# Patient Record
Sex: Female | Born: 1985
Health system: Southern US, Community
[De-identification: ages and names within clinical notes are randomized; demographics above are authoritative.]

## PROBLEM LIST (undated history)

## (undated) ENCOUNTER — Inpatient Hospital Stay (HOSPITAL_COMMUNITY): Payer: Self-pay

## (undated) DIAGNOSIS — J302 Other seasonal allergic rhinitis: Secondary | ICD-10-CM

## (undated) DIAGNOSIS — F419 Anxiety disorder, unspecified: Secondary | ICD-10-CM

## (undated) DIAGNOSIS — R42 Dizziness and giddiness: Secondary | ICD-10-CM

## (undated) DIAGNOSIS — J020 Streptococcal pharyngitis: Secondary | ICD-10-CM

## (undated) DIAGNOSIS — K589 Irritable bowel syndrome without diarrhea: Secondary | ICD-10-CM

## (undated) DIAGNOSIS — J45909 Unspecified asthma, uncomplicated: Secondary | ICD-10-CM

## (undated) DIAGNOSIS — I491 Atrial premature depolarization: Secondary | ICD-10-CM

## (undated) DIAGNOSIS — E669 Obesity, unspecified: Secondary | ICD-10-CM

## (undated) DIAGNOSIS — M26609 Unspecified temporomandibular joint disorder, unspecified side: Secondary | ICD-10-CM

## (undated) DIAGNOSIS — T7840XA Allergy, unspecified, initial encounter: Secondary | ICD-10-CM

## (undated) DIAGNOSIS — Q992 Fragile X chromosome: Secondary | ICD-10-CM

## (undated) DIAGNOSIS — Z8489 Family history of other specified conditions: Secondary | ICD-10-CM

## (undated) HISTORY — PX: SPINE SURGERY: SHX786

## (undated) HISTORY — DX: Atrial premature depolarization: I49.1

## (undated) HISTORY — DX: Irritable bowel syndrome, unspecified: K58.9

## (undated) HISTORY — DX: Family history of other specified conditions: Z84.89

## (undated) HISTORY — DX: Unspecified asthma, uncomplicated: J45.909

## (undated) HISTORY — DX: Unspecified temporomandibular joint disorder, unspecified side: M26.609

## (undated) HISTORY — DX: Obesity, unspecified: E66.9

## (undated) HISTORY — DX: Allergy, unspecified, initial encounter: T78.40XA

## (undated) HISTORY — PX: BRAIN SURGERY: SHX531

## (undated) HISTORY — DX: Dizziness and giddiness: R42

## (undated) HISTORY — DX: Anxiety disorder, unspecified: F41.9

## (undated) HISTORY — DX: Fragile x chromosome: Q99.2

---

## 2011-10-28 ENCOUNTER — Emergency Department (HOSPITAL_COMMUNITY)
Admission: EM | Admit: 2011-10-28 | Discharge: 2011-10-28 | Disposition: A | Discharge status: Home Health | Payer: Self-pay | Source: Home / Self Care | Attending: Emergency Medicine | Admitting: Emergency Medicine

## 2011-10-28 ENCOUNTER — Encounter (HOSPITAL_COMMUNITY): Payer: Self-pay

## 2011-10-28 DIAGNOSIS — J029 Acute pharyngitis, unspecified: Secondary | ICD-10-CM

## 2011-10-28 HISTORY — DX: Streptococcal pharyngitis: J02.0

## 2011-10-28 HISTORY — DX: Other seasonal allergic rhinitis: J30.2

## 2011-10-28 LAB — POCT RAPID STREP A: Streptococcus, Group A Screen (Direct): NEGATIVE

## 2011-10-28 MED ORDER — LIDOCAINE VISCOUS 2 % MT SOLN: 10.0000 mL | OROMUCOSAL | Status: AC | PRN

## 2011-10-28 MED ORDER — FLUTICASONE PROPIONATE 50 MCG/ACT NA SUSP: 2.0000 | Freq: Every day | NASAL | Status: DC

## 2011-10-28 NOTE — ED Provider Notes (Signed)
History     CSN: [id number]  Arrival date & time 10/28/11  1751   First MD Initiated Contact with Patient 10/28/11 1816      Chief Complaint  Patient presents with  . Sore Throat    (Consider location/radiation/quality/duration/timing/severity/associated sxs/prior treatment) HPI Comments: Patient reports sore, irritated throat starting 3 days ago. Patient initially thought that this is due to allergies, as she also has some rhinorrhea and sneezing, postnasal drip, so she started taking Allegra-D without improvement. Also states her gums are starting to swell, which is typical of previous strep infections. No nausea, vomiting, fevers, drooling, trismus, ear pain, sinus pain/pressure, abdominal pain, rash, reflux symptoms. Patient's significant other was just diagnosed with strep throat.  ROS as noted in HPI. All other ROS negative.   Patient is a 26 y.o. female presenting with pharyngitis. The history is provided by the patient. No language interpreter was used.  Sore Throat This is a new problem. The current episode started more than 2 days ago. The problem occurs constantly. The problem has been gradually worsening. Pertinent negatives include no chest pain, no abdominal pain, no headaches and no shortness of breath. The symptoms are aggravated by nothing. The symptoms are relieved by nothing. Treatments tried: allergy medications. The treatment provided mild relief.    Past Medical History  Diagnosis Date  . Seasonal allergic rhinitis   . Strep pharyngitis     Past Surgical History  Procedure Date  . Brain surgery     arnold-chiari malformation    History reviewed. No pertinent family history.  History  Substance Use Topics  . Smoking status: Never Smoker   . Smokeless tobacco: Not on file  . Alcohol Use: Yes    OB History    Grav Para Term Preterm Abortions TAB SAB Ect Mult Living                  Review of Systems  Respiratory: Negative for shortness of breath.    Cardiovascular: Negative for chest pain.  Gastrointestinal: Negative for abdominal pain.  Neurological: Negative for headaches.    Allergies  Review of patient's allergies indicates no known allergies.  Home Medications   Current Outpatient Rx  Name Route Sig Dispense Refill  . ASPIRIN 81 MG PO TABS Oral Take 81 mg by mouth daily.    Marland Kitchen FEXOFENADINE-PSEUDOEPHED ER 180-240 MG PO TB24 Oral Take 1 tablet by mouth daily.    Marland Kitchen FLUTICASONE PROPIONATE 50 MCG/ACT NA SUSP Nasal Place 2 sprays into the nose daily. 16 g 0  . LIDOCAINE VISCOUS 2 % MT SOLN Oral Take 10 mLs by mouth as needed for pain. Swish and spit. Do not swallow. 100 mL 0    BP 134/80  Pulse 84  Temp(Src) 98.5 F (36.9 C) (Oral)  Resp 19  SpO2 100%  LMP 10/16/2011  Physical Exam  Nursing note and vitals reviewed. Constitutional: She is oriented to person, place, and time. She appears well-developed and well-nourished. No distress.  HENT:  Head: Normocephalic and atraumatic. No trismus in the jaw.  Right Ear: Tympanic membrane normal.  Left Ear: Tympanic membrane normal.  Nose: Mucosal edema and rhinorrhea present. Right sinus exhibits no maxillary sinus tenderness and no frontal sinus tenderness. Left sinus exhibits no maxillary sinus tenderness and no frontal sinus tenderness.  Mouth/Throat: Uvula is midline, oropharynx is clear and moist and mucous membranes are normal.       Erythematous oropharynx, tonsils. No exudates.  Eyes: Conjunctivae and EOM are normal.  Neck: Normal range of motion.  Cardiovascular: Normal rate, regular rhythm and normal heart sounds.   Pulmonary/Chest: Effort normal and breath sounds normal.  Abdominal: She exhibits no distension.  Musculoskeletal: Normal range of motion.  Lymphadenopathy:    She has no cervical adenopathy.  Neurological: She is alert and oriented to person, place, and time.  Skin: Skin is warm and dry.  Psychiatric: She has a normal mood and affect. Her behavior is  normal. Judgment and thought content normal.    ED Course  Procedures (including critical care time)   Labs Reviewed  POCT RAPID STREP A ([facility] ONLY)  STREP A DNA PROBE   No results found.   1. Pharyngitis     Results for orders placed during the hospital encounter of 10/28/11  POCT RAPID STREP A ([facility] ONLY)      Component Value Range   Streptococcus, Group A Screen (Direct) NEGATIVE  NEGATIVE      MDM   Rapid strep negative. Obtaining throat culture to guide antibiotic results. In the meantime, treating as if this is pharyngitis from her allergies. Sending home with Flonase, saline nasal irrigation. Discussed this with patientt. We'll contact them if culture is positive, and will call in pcn 500 mg tid x 10 days. Patient home with ibuprofen, Tylenol. Patient to followup with PMD when necessary, will refer to local primary care resources.   Luiz Blare, MD 10/28/11 2242

## 2011-10-28 NOTE — ED Notes (Signed)
Pt c/o sore throat onset Sunday.  Pt states fiancee recently DX with Strep last night.

## 2011-10-28 NOTE — Discharge Instructions (Signed)

## 2011-10-29 LAB — STREP A DNA PROBE: Group A Strep Probe: NEGATIVE

## 2011-11-02 ENCOUNTER — Emergency Department (INDEPENDENT_AMBULATORY_CARE_PROVIDER_SITE_OTHER)
Admission: EM | Admit: 2011-11-02 | Discharge: 2011-11-02 | Disposition: A | Discharge status: Home / Self Care | Payer: Self-pay | Source: Home / Self Care | Attending: Emergency Medicine | Admitting: Emergency Medicine

## 2011-11-02 ENCOUNTER — Encounter (HOSPITAL_COMMUNITY): Payer: Self-pay

## 2011-11-02 DIAGNOSIS — H8110 Benign paroxysmal vertigo, unspecified ear: Secondary | ICD-10-CM

## 2011-11-02 DIAGNOSIS — M2669 Other specified disorders of temporomandibular joint: Secondary | ICD-10-CM

## 2011-11-02 MED ORDER — MECLIZINE HCL 25 MG PO TABS: 25.0000 mg | ORAL_TABLET | Freq: Four times a day (QID) | ORAL | Status: AC

## 2011-11-02 MED ORDER — DICLOFENAC SODIUM 75 MG PO TBEC: 75.0000 mg | DELAYED_RELEASE_TABLET | Freq: Two times a day (BID) | ORAL | Status: DC

## 2011-11-02 NOTE — ED Provider Notes (Signed)
Chief Complaint  Patient presents with  . Jaw Pain    History of Present Illness:  Kristina Stewart is a 26 year old female who presents today with vertigo and right ear pain.  The vertigo began yesterday when she woke up in the morning. She describes spinning of her surroundings which is worse if she lies flat, closes her eyes, leans forward, or rolls over. She had this before a few months ago and was told it was due to an inner ear problem. She was given Antivert for this. It cleared up by itself. She denies any difficulty hearing or ringing in her ears. She has had no headache, diplopia, blurry vision, numbness, tingling, weakness, or difficulty with speech or ambulation.  Her second complaint has been pain in her right ear. This has been going on for 2 days as well. The ear feels congested. She denies any drainage, nasal congestion, rhinorrhea, sore throat, or fever. She has braces. She's wearing a retainer right now. She does not have a dentist here in [place].  Review of Systems:  Other than noted above, the patient denies any of the following symptoms: Systemic:  No fever, chills, fatigue, or weight loss. Eye:  No blurred vision, visual change or diplopia. ENT:  No ear pain, tinnitus, hearing loss, nasal congestion, or rhinorrhea. Cardiac:  No chest pain, dyspnea, palpitations or syncope. Neuro:  No headache, paresthesias, weakness, trouble with speech, coordination or ambulation.  PMFSH:  Past medical history, family history, social history, meds, and allergies were reviewed.  Physical Exam:   Vital signs:  BP 114/75  Pulse 66  Temp(Src) 98.2 F (36.8 C) (Oral)  Resp 16  SpO2 100%  LMP 10/16/2011 General:  Alert, oriented times 3, in no distress. Eye:  PERRL, full EOM, no nystagmus. ENT:  TMs and canals normal.  Nasal mucosa normal.  Pharynx clear. There was tenderness to palpation over the right TMJ. She had no trismus but to have pain on opening and closing of her mouth. Range of  motion of the TMJ was normal. Neck:  No adenopathy, tenderness, or mass.  Thyroid normal.  No carotid bruit. Lungs:  Breath sounds clear and equal bilaterally.  No wheezes, rales or rhonchi. Heart:  Regular rhythm.  No gallops, murmers, or rubs. Neuro:  Alert and oriented times 3.  Cranial nerves intact.  No pronator drift.  Finger to nose normal.  No focal weakness.  Sensation intact to light touch.  Romberg's sign negative, gait normal.  Able to do tandem gait well.  The Dix-Hallpike maneuver was positive with the right ear down.  Assessment:  The primary encounter diagnosis was Benign positional vertigo. A diagnosis of TMJ arthritis was also pertinent to this visit.  Plan:   1.  The following meds were prescribed:   New Prescriptions   DICLOFENAC (VOLTAREN) 75 MG EC TABLET    Take 1 tablet (75 mg total) by mouth 2 (two) times daily.   MECLIZINE (ANTIVERT) 25 MG TABLET    Take 1 tablet (25 mg total) by mouth 4 (four) times daily.   2.  The patient was instructed in symptomatic care and handouts were given. 3.  The patient was told to return if becoming worse in any way, if no better in 3 or 4 days, and given some red flag symptoms that would indicate earlier return. She was told to followup with her dentist with regard to the TMJ syndrome, follow soft diet, and apply heat or ice. She was given Epley exercises for  the vertigo and is to come back in a week if no improvement.    Reuben Likes, MD 11/02/11 [phone]

## 2011-11-02 NOTE — Discharge Instructions (Signed)
Benign Positional Vertigo Vertigo means you feel like you or your surroundings are moving when they are not. Benign positional vertigo is the most common form of vertigo. Benign means that the cause of your condition is not serious. Benign positional vertigo is more common in older adults. CAUSES  Benign positional vertigo is the result of an upset in the labyrinth system. This is an area in the middle ear that helps control your balance. This may be caused by a viral infection, head injury, or repetitive motion. However, often no specific cause is found. SYMPTOMS  Symptoms of benign positional vertigo occur when you move your head or eyes in different directions. Some of the symptoms may include:  Loss of balance and falls.   Vomiting.   Blurred vision.   Dizziness.   Nausea.   Involuntary eye movements (nystagmus).  DIAGNOSIS  Benign positional vertigo is usually diagnosed by physical exam. If the specific cause of your benign positional vertigo is unknown, your caregiver may perform imaging tests, such as magnetic resonance imaging (MRI) or computed tomography (CT). TREATMENT  Your caregiver may recommend movements or procedures to correct the benign positional vertigo. Medicines such as meclizine, benzodiazepines, and medicines for nausea may be used to treat your symptoms. In rare cases, if your symptoms are caused by certain conditions that affect the inner ear, you may need surgery. HOME CARE INSTRUCTIONS   Follow your caregiver's instructions.   Move slowly. Do not make sudden body or head movements.   Avoid driving.   Avoid operating heavy machinery.   Avoid performing any tasks that would be dangerous to you or others during a vertigo episode.   Drink enough fluids to keep your urine clear or pale yellow.  SEEK IMMEDIATE MEDICAL CARE IF:   You develop problems with walking, weakness, numbness, or using your arms, hands, or legs.   You have difficulty speaking.   You  develop severe headaches.   Your nausea or vomiting continues or gets worse.   You develop visual changes.   Your family or friends notice any behavioral changes.   Your condition gets worse.   You have a fever.   You develop a stiff neck or sensitivity to light.  MAKE SURE YOU:   Understand these instructions.   Will watch your condition.   Will get help right away if you are not doing well or get worse.  Document Released: 03/23/2006 Document Revised: 06/04/2011 Document Reviewed: 03/05/2011 [facility] Patient Information 2012 [place], [place].Jaw Range of Motion Exercises Do the exercises below to prevent problems opening your mouth after a jaw fracture, TMJ, or surgery. HOME CARE Warm up  Open your mouth as wide as possible for 15 seconds.   Then, close your mouth and rest. Repeat this 8 times.  Exercises  Stick your jaw forward for 15 seconds. Rest your jaw. Repeat this 8 times.   Move your jaw right for 15 seconds. Rest your jaw and repeat 8 times.   Move your jaw left for 15 seconds. Rest your jaw and repeat 8 times.   Put your middle finger and thumb in your mouth. Push with your thumb on your top teeth and your middle finger on your bottom teeth opening your mouth.   Stretch your mouth open as far as possible. Repeat 8 times.   Chew gum when you are not doing exercises.   Use moist heat packs 3 to 4 times a day on your jaw area.  Document Released: 05/28/2008 Document Revised:  06/04/2011 Document Reviewed: 05/28/2008 [employer] Patient Information 2012 [place], [place].Vertigo Vertigo means you feel like you or your surroundings are moving when they are not. Vertigo can be dangerous if it occurs when you are at work, driving, or performing difficult activities.  CAUSES  Vertigo occurs when there is a conflict of signals sent to your brain from the visual and sensory systems in your body. There are many different causes of vertigo, including:  Infections,  especially in the inner ear.   A bad reaction to a drug or misuse of alcohol and medicines.   Withdrawal from drugs or alcohol.   Rapidly changing positions, such as lying down or rolling over in bed.   A migraine headache.   Decreased blood flow to the brain.   Increased pressure in the brain from a head injury, infection, tumor, or bleeding.  SYMPTOMS  You may feel as though the world is spinning around or you are falling to the ground. Because your balance is upset, vertigo can cause nausea and vomiting. You may have involuntary eye movements (nystagmus). DIAGNOSIS  Vertigo is usually diagnosed by physical exam. If the cause of your vertigo is unknown, your caregiver may perform imaging tests, such as an MRI scan (magnetic resonance imaging). TREATMENT  Most cases of vertigo resolve on their own, without treatment. Depending on the cause, your caregiver may prescribe certain medicines. If your vertigo is related to body position issues, your caregiver may recommend movements or procedures to correct the problem. In rare cases, if your vertigo is caused by certain inner ear problems, you may need surgery. HOME CARE INSTRUCTIONS   Follow your caregiver's instructions.   Avoid driving.   Avoid operating heavy machinery.   Avoid performing any tasks that would be dangerous to you or others during a vertigo episode.   Tell your caregiver if you notice that certain medicines seem to be causing your vertigo. Some of the medicines used to treat vertigo episodes can actually make them worse in some people.  SEEK IMMEDIATE MEDICAL CARE IF:   Your medicines do not relieve your vertigo or are making it worse.   You develop problems with talking, walking, weakness, or using your arms, hands, or legs.   You develop severe headaches.   Your nausea or vomiting continues or gets worse.   You develop visual changes.   A family member notices behavioral changes.   Your condition gets  worse.  MAKE SURE YOU:  Understand these instructions.   Will watch your condition.   Will get help right away if you are not doing well or get worse.  Document Released: 03/25/2005 Document Revised: 06/04/2011 Document Reviewed: 01/01/2011 [facility] Patient Information 2012 [place], [place].

## 2011-11-02 NOTE — ED Notes (Addendum)
C/o she woke w  right  jaw pain , dizziness and  right ear pain yesterday ; has invisalign dental adjustment appliances  , and initially thought it was her teeth shifting; NAD; states she has not taken any medication for this issue , as se does not like to take medication

## 2012-01-12 ENCOUNTER — Encounter: Payer: Self-pay | Admitting: Internal Medicine

## 2012-01-12 ENCOUNTER — Ambulatory Visit (INDEPENDENT_AMBULATORY_CARE_PROVIDER_SITE_OTHER): Payer: 59 | Admitting: Internal Medicine

## 2012-01-12 VITALS — BP 118/76 | HR 76 | Temp 98.9°F | Ht 65.0 in | Wt 214.0 lb

## 2012-01-12 DIAGNOSIS — E7210 Disorders of sulfur-bearing amino-acid metabolism, unspecified: Secondary | ICD-10-CM

## 2012-01-12 DIAGNOSIS — Z0000 Encounter for general adult medical examination without abnormal findings: Secondary | ICD-10-CM

## 2012-01-12 DIAGNOSIS — M79609 Pain in unspecified limb: Secondary | ICD-10-CM

## 2012-01-12 DIAGNOSIS — R609 Edema, unspecified: Secondary | ICD-10-CM

## 2012-01-12 DIAGNOSIS — E7212 Methylenetetrahydrofolate reductase deficiency: Secondary | ICD-10-CM

## 2012-01-12 DIAGNOSIS — M79604 Pain in right leg: Secondary | ICD-10-CM

## 2012-01-12 LAB — POCT URINALYSIS DIPSTICK
Blood, UA: NEGATIVE
Ketones, UA: NEGATIVE
Leukocytes, UA: NEGATIVE
Protein, UA: NEGATIVE
pH, UA: 7

## 2012-01-12 LAB — CBC WITH DIFFERENTIAL/PLATELET
Basophils Absolute: 0 10*3/uL (ref 0.0–0.1)
Basophils Relative: 0 % (ref 0–1)
Eosinophils Absolute: 0.2 10*3/uL (ref 0.0–0.7)
Eosinophils Relative: 2 % (ref 0–5)
HCT: 40.9 % (ref 36.0–46.0)
Hemoglobin: 14 g/dL (ref 12.0–15.0)
MCH: 28.4 pg (ref 26.0–34.0)
MCHC: 34.2 g/dL (ref 30.0–36.0)
Monocytes Absolute: 0.6 10*3/uL (ref 0.1–1.0)
Monocytes Relative: 5 % (ref 3–12)
Neutro Abs: 7.5 10*3/uL (ref 1.7–7.7)
RDW: 13.5 % (ref 11.5–15.5)

## 2012-01-12 LAB — LIPID PANEL
HDL: 55 mg/dL (ref >39)
LDL Cholesterol: 115 mg/dL — ABNORMAL HIGH (ref 0–99)
Total CHOL/HDL Ratio: 3.6 Ratio
Triglycerides: 138 mg/dL (ref <150)

## 2012-01-12 LAB — COMPREHENSIVE METABOLIC PANEL
AST: 17 U/L (ref 0–37)
Albumin: 4.4 g/dL (ref 3.5–5.2)
Alkaline Phosphatase: 70 U/L (ref 39–117)
BUN: 13 mg/dL (ref 6–23)
Creat: 0.73 mg/dL (ref 0.50–1.10)
Glucose, Bld: 78 mg/dL (ref 70–99)
Total Bilirubin: 0.4 mg/dL (ref 0.3–1.2)

## 2012-01-12 LAB — TSH: TSH: 1.603 u[IU]/mL (ref 0.350–4.500)

## 2012-01-13 ENCOUNTER — Encounter (HOSPITAL_COMMUNITY): Payer: Self-pay

## 2012-01-18 ENCOUNTER — Encounter: Payer: Self-pay | Admitting: Internal Medicine

## 2012-01-18 DIAGNOSIS — E7212 Methylenetetrahydrofolate reductase deficiency: Secondary | ICD-10-CM | POA: Insufficient documentation

## 2012-01-18 DIAGNOSIS — M79604 Pain in right leg: Secondary | ICD-10-CM | POA: Insufficient documentation

## 2012-01-18 DIAGNOSIS — R609 Edema, unspecified: Secondary | ICD-10-CM | POA: Insufficient documentation

## 2012-01-18 NOTE — Progress Notes (Signed)
Subjective:    Patient ID: Kristina Stewart, female    DOB: [birth year], 26 y.o.   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 She husband are planning honeymoon in the next couple of weeks. They'll be doing a lot of traveling to [place] and then by plane to the [place]. Patient tells me that she had lab tests done in [place] indicating that she had MTHFR gene mutation. Unfortunately at the time of her initial visit she did not have those records with her. She delivered the records couple of days after her initial visit. Patient wants to see a hematologist regarding this condition. Patient has been on her feet a lot. Has been wearing some tight compression stockings to work. Her legs hurt after a long day at work.  She seems anxious about her health. She says that doctors do not listen to her when she was younger. She says she had a craniotomy in may 2006 for an Arnold-Chiari malformation. She is allergic to latex. Says she has a number of food allergies including almonds, cherries, apples and plum. Gets itchy throat when she eats these foods. She been taking aspirin 81 mg daily on advice from physician in [place] until she could get established here. Takes a multivitamin daily, fish oil 3000 mg daily and Allegra.  She does not smoke. Rare alcohol consumption consisting of maybe 4 ounces a month. No children. Husband will be teaching advanced mathematics at [employer].. Family history her age 4 with history of hypertension. Mother age 63 with history of hypertension. One brother in good health. One sister with history of fragile X. syndrome and ADHD age 65.  Patient later brought in records from [place] showing protein S of 99, protein C of 120; factor V Leiden mutation negative. However records indicate that she is homozygous   positive    For MTHFR  C 677T  [place]. Patient is worried about this diagnosis.  She has a history of allergic rhinitis. Has had benign positional vertigo diagnosed at [facility] urgent care 11/02/2011. History of TMJ syndrome.     Review of Systems  Constitutional: Negative.   HENT:       History of allergic rhinitis  Eyes: Negative.   Respiratory: Negative.   Cardiovascular: Negative.   Gastrointestinal: Negative.   Genitourinary:       Menarche at age 32. Menstrual period last 6 days every 27 days. Last menstrual period 01/07/2012.  Musculoskeletal:       Leg pain  Neurological:       History of surgery for Arnold-Chiari malformation  Hematological: Negative.   Psychiatric/Behavioral:       Anxious       Objective:   Physical Exam  Constitutional: She appears well-developed and well-nourished. No distress.  HENT:  Head: Normocephalic and atraumatic.  Right Ear: External ear normal.  Left Ear: External ear normal.  Mouth/Throat: Oropharynx is clear and moist.  Eyes: Conjunctivae and EOM are normal. Pupils are equal, round, and reactive to light. Right eye exhibits no discharge. Left eye exhibits no discharge. No scleral icterus.  Neck: Neck supple. Thyromegaly present.  Cardiovascular: Normal rate, regular rhythm, normal heart sounds and intact distal pulses.   No murmur heard. Pulmonary/Chest: Effort normal and breath sounds normal. She has no wheezes. She has no rales.  Abdominal: Soft. Bowel sounds are normal. She  exhibits no distension and no mass. There is no tenderness. There is no rebound and no guarding.  Musculoskeletal: She exhibits no edema.  Lymphadenopathy:    She has no cervical adenopathy.  Neurological: She is alert. She has normal reflexes. She displays normal reflexes. No cranial nerve deficit.  Skin: Skin is warm and dry. No rash noted. She is not diaphoretic.  Psychiatric: She has a normal mood and affect. Her behavior is normal. Judgment and thought  content normal.          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 She currently is on 81 mg of aspirin daily. May consider just wearing some good support pantyhose instead of tight compression stockings. Arrange for evaluation with hematologist. Leg pain may improve with change in support hosiery. Patient may not be at high risk for clotting disorder with this allele. She was sent information on this condition.  Plan:

## 2012-01-18 NOTE — Patient Instructions (Addendum)
Take aspirin 325 mg daily while on honeymoon been decreased to 81 mg of aspirin daily when he returned. We will get appointment for you to see hematologist regarding your results from lab work done in [place] January 2013

## 2012-01-29 ENCOUNTER — Other Ambulatory Visit: Payer: Self-pay | Admitting: Oncology

## 2012-01-29 DIAGNOSIS — E7212 Methylenetetrahydrofolate reductase deficiency: Secondary | ICD-10-CM

## 2012-02-03 ENCOUNTER — Telehealth: Payer: Self-pay | Admitting: Oncology

## 2012-02-03 NOTE — Telephone Encounter (Signed)
Referred by Dr. Sharlet Salina Dx- MTHFR [id number]. NP packet mailed out.

## 2012-02-03 NOTE — Telephone Encounter (Signed)
S/w pt today re appt for 9/4 lb and 9/11 w/JG.

## 2012-02-05 ENCOUNTER — Other Ambulatory Visit: Payer: Self-pay | Admitting: Oncology

## 2012-02-05 DIAGNOSIS — E7212 Methylenetetrahydrofolate reductase deficiency: Secondary | ICD-10-CM

## 2012-03-02 ENCOUNTER — Other Ambulatory Visit (HOSPITAL_BASED_OUTPATIENT_CLINIC_OR_DEPARTMENT_OTHER): Payer: 59 | Admitting: Lab

## 2012-03-02 ENCOUNTER — Ambulatory Visit: Payer: 59

## 2012-03-02 ENCOUNTER — Encounter: Payer: Self-pay | Admitting: Oncology

## 2012-03-02 DIAGNOSIS — E7210 Disorders of sulfur-bearing amino-acid metabolism, unspecified: Secondary | ICD-10-CM

## 2012-03-02 DIAGNOSIS — E7212 Methylenetetrahydrofolate reductase deficiency: Secondary | ICD-10-CM

## 2012-03-02 LAB — CBC & DIFF AND RETIC
Basophils Absolute: 0 10*3/uL (ref 0.0–0.1)
EOS%: 1.8 % (ref 0.0–7.0)
Eosinophils Absolute: 0.2 10*3/uL (ref 0.0–0.5)
HGB: 13 g/dL (ref 11.6–15.9)
Immature Retic Fract: 4.8 % (ref 1.60–10.00)
NEUT#: 7.9 10*3/uL — ABNORMAL HIGH (ref 1.5–6.5)
RDW: 13.3 % (ref 11.2–14.5)
Retic Ct Abs: 61.25 10*3/uL (ref 33.70–90.70)
lymph#: 2.1 10*3/uL (ref 0.9–3.3)

## 2012-03-02 LAB — MORPHOLOGY: PLT EST: ADEQUATE

## 2012-03-02 LAB — FOLATE: Folate: >20 ng/mL

## 2012-03-02 NOTE — Progress Notes (Signed)
Checked in new pt.  No financial concerns at this time. °

## 2012-03-09 ENCOUNTER — Ambulatory Visit (HOSPITAL_BASED_OUTPATIENT_CLINIC_OR_DEPARTMENT_OTHER): Payer: 59 | Admitting: Oncology

## 2012-03-09 ENCOUNTER — Telehealth: Payer: Self-pay | Admitting: Oncology

## 2012-03-09 VITALS — BP 119/78 | HR 66 | Temp 98.9°F | Resp 20 | Ht 65.0 in | Wt 220.2 lb

## 2012-03-09 DIAGNOSIS — E7211 Homocystinuria: Secondary | ICD-10-CM

## 2012-03-09 DIAGNOSIS — E7210 Disorders of sulfur-bearing amino-acid metabolism, unspecified: Secondary | ICD-10-CM

## 2012-03-09 NOTE — Progress Notes (Signed)
New Patient Hematology-Oncology Evaluation   Kristina Stewart [id number] 1985/12/16 26 y.o. 03/09/2012   CC:    Reason for referral: Counseling with respect of the finding of homozygous MTFHR gene mutation  Pleasant 26 year old woman just moved here from [place] in January of this year. Although she has no personal or family history of blood clots, a physician in [place] ordered a partial hypercoagulation profile apparently because she told him that she planned to do some traveling. She was found to be a homozygote for the MT HFR gene. She tested negative for the factor V Leiden gene mutation. She had a normal functional protein as 99% of control and functional protein C 129% of control. Anticardiolipin antibodies were not detected. She had a normal complete blood count with a hemoglobin of 14 and a platelet count of 354,000.  Her medical history is most remarkable for the fact that she was found to have a Chiari malformation at age 72 and had a neurosurgical procedure to decompress her skull with resection of cervical vertebrae. No perioperative complications and a good clinical result with resolution of severe headaches. She has had no other major surgical procedures.  She has never been pregnant. She is recently married and is considering having a family but not right away.  She has a sister who has the fragile X. Syndrome. She is 24 years old. She is a healthy 28 year old brother. Parents both have hypertension mother also has hypothyroidism no one in the family has had problem with blood clots. A maternal aunt who is a heavy smoker and hypertensive has had DVTs and has a caval filter.      PMH: Past Medical History  Diagnosis Date  . Seasonal allergic rhinitis   . Strep pharyngitis   . Blood dyscrasia   . Allergy   . TMJ (temporomandibular joint syndrome)    childhood asthma and she does start wheezing and she exercises strenuously. Not currently taking medication for  this. No history of thyroid disease but many women in her family have thyroid problems. No hypertension, MI, ulcers, hepatitis, yellow jaundice, seizures.  Past Surgical History  Procedure Date  . Brain surgery     arnold-chiari malformation  . Brain surgery     craniotomy/arnold chiari malformation     Allergies: Allergies  Allergen Reactions  . Latex Rash  . Latex     Medications: She was put on aspirin 81 mg daily pending visit today. Allegra one daily. Omega-3 show capsules 1000 mg 3 capsules daily, lysine 1000 mg one daily, multivitamins with minerals one daily.   Social History:   reports that she has never smoked. She has never used smokeless tobacco. She reports that she drinks about 2.4 ounces of alcohol per week. She reports that she does not use illicit drugs.  Family History: Family History  Problem Relation Age of Onset  . Hypertension Mother   . Hypertension Father     Review of Systems: Constitutional symptoms: No constitutional symptoms HEENT: No sore throat Respiratory: No cough or dyspnea. Cardiovascular:  No chest pain or palpitations Gastrointestinal ROS: No change in bowel habit Genito-Urinary ROS: Regular menstrual cycles  Hematological and Lymphatic: Musculoskeletal: No muscle or bone pain. Legs feel heavy at times. Neurologic: No headache or change in vision Dermatologic: No rash or ecchymosis Remaining ROS negative. She eats a healthy diet.  Physical Exam: Blood pressure 119/78, pulse 66, temperature 98.9 F (37.2 C), temperature source Oral, resp. rate 20, height 5\' 5"  (1.651 m),  weight 220 lb 3.2 oz (99.882 kg). Wt Readings from Last 3 Encounters:  03/09/12 220 lb 3.2 oz (99.882 kg)  01/12/12 214 lb (97.07 kg)    General appearance: Well-nourished Caucasian woman Head: Normal Neck: Scar posterior neck from a previous surgery for Chiari malformation Lymph nodes: No adenopathy Breasts: Not examined Lungs: Clear to auscultation resonant  to percussion Heart: Regular rhythm no murmur gallop or click Abdominal: Soft nontender no mass no organomegaly GU: Not examined Extremities: No edema no calf tenderness Neurologic: Mental status intact, PERRLA, optic disc sharp and vessels normal, motor strength 5 over 5, reflexes 1+ symmetric. Skin: No rash or ecchymosis    Lab Results: Lab Results  Component Value Date   WBC 10.9* 03/02/2012   HGB 13.0 03/02/2012   HCT 38.6 03/02/2012   MCV 83.2 03/02/2012   PLT 308 03/02/2012     Chemistry      Component Value Date/Time   NA 139 01/12/2012 1013   K 4.3 01/12/2012 1013   CL 103 01/12/2012 1013   CO2 28 01/12/2012 1013   BUN 13 01/12/2012 1013   CREATININE 0.73 01/12/2012 1013      Component Value Date/Time   CALCIUM 10.3 01/12/2012 1013   ALKPHOS 70 01/12/2012 1013   AST 17 01/12/2012 1013   ALT 16 01/12/2012 1013   BILITOT 0.4 01/12/2012 1013        Impression and Plan: Incidentally found on the side this status for the MTHFR gene polymorphism  We have learned a lot about this gene polymorphism over the last 10 years. It appears to be a surrogate marker for a slight increase in the risk of arterial and venous thrombosis but this risk appears not to be even as high as the clotting risk associated with hypertension, diabetes, or hyperlipidemia.  In patients who have had blood clots that had been felt to be  associated with elevated plasma homocystine, even if homocystine is lowered by using B complex vitamin therapy, risk for subsequent clotting events is not lowered   It is no longer felt that this gene should be tested on a routine clotting profile in patients who present with otherwise idiopathic thrombosis. It is no longer felt that B vitamin supplementation should be given even if the homocystine levels are elevated. It is no longer believed that there is any association between mutations in this gene and pregnancy complications, including recurrent miscarriage or fetal growth  retardation. There is no data that there is any contraindication to using oral contraceptives in patients with this gene polymorphism.  Patient is reassured that this is essentially a variation of normal and should be of little clinical consequence. She does not need to take aspirin or the vitamin supplementation unless she otherwise wants to do this.      Levert Feinstein, MD 03/09/2012, 1:07 PM

## 2012-03-09 NOTE — Telephone Encounter (Signed)
Per 9/11 pof return PRN.

## 2012-04-01 ENCOUNTER — Encounter: Payer: Self-pay | Admitting: Obstetrics and Gynecology

## 2012-04-01 ENCOUNTER — Ambulatory Visit (INDEPENDENT_AMBULATORY_CARE_PROVIDER_SITE_OTHER): Payer: 59 | Admitting: Obstetrics and Gynecology

## 2012-04-01 VITALS — BP 110/72 | HR 76 | Ht 64.5 in | Wt 220.0 lb

## 2012-04-01 DIAGNOSIS — Z01419 Encounter for gynecological examination (general) (routine) without abnormal findings: Secondary | ICD-10-CM

## 2012-04-01 DIAGNOSIS — E7210 Disorders of sulfur-bearing amino-acid metabolism, unspecified: Secondary | ICD-10-CM

## 2012-04-01 DIAGNOSIS — E7212 Methylenetetrahydrofolate reductase deficiency: Secondary | ICD-10-CM

## 2012-04-01 DIAGNOSIS — Z124 Encounter for screening for malignant neoplasm of cervix: Secondary | ICD-10-CM

## 2012-04-01 MED ORDER — ETONOGESTREL-ETHINYL ESTRADIOL 0.12-0.015 MG/24HR VA RING: VAGINAL_RING | VAGINAL | Status: DC

## 2012-04-01 NOTE — Progress Notes (Addendum)
The patient reports:no complaints  Contraception:SPONGE; PT WANT TO GET ON NUVARING  Last mammogram: not applicable Last pap: was normal and not applicable September  2012  GC/Chlamydia cultures offered: declined HIV/RPR/HbsAg offered:  declined HSV 1 and 2 glycoprotein offered: declined  Menstrual cycle regular and monthly: Yes Menstrual flow normal: No: PT STATES CYCLES ARE HEAVY  Urinary symptoms: none Normal bowel movements: Yes Reports abuse at home: No:   PT IS MARRIED.  Subjective:    Kristina Stewart is a 26 y.o. female, G0P0, who presents for an annual exam. Pt is a vascular sonographer at [employer] Was worked up for thrombophilia in [place] and found to be MTFHR carrier. Saw Dr Cyndie Chime 2 weeks ago with normal levels of homocysteine and was given OK to use hormonal contraception. Previous satisfied Nuvaring user    History   Social History  . Marital Status: Married    Spouse Name: N/A    Number of Children: N/A  . Years of Education: N/A   Social History Main Topics  . Smoking status: Never Smoker   . Smokeless tobacco: Never Used  . Alcohol Use: 2.4 oz/week    4 Glasses of wine per week  . Drug Use: No  . Sexually Active: Yes    Birth Control/ Protection: Sponge   Other Topics Concern  . None   Social History Narrative   ** Merged History Encounter **     Menstrual cycle:   LMP: Patient's last menstrual period was 03/07/2012.           Cycle: regular but heavy  The following portions of the patient's history were reviewed and updated as appropriate: allergies, current medications, past family history, past medical history, past social history, past surgical history and problem list.  Review of Systems Pertinent items are noted in HPI. Breast:Negative for breast lump,nipple discharge or nipple retraction Gastrointestinal: Negative for abdominal pain, change in bowel habits or rectal bleeding Urinary:negative   Objective:    BP 110/72  Pulse 76  Ht  5' 4.5" (1.638 m)  Wt 220 lb (99.791 kg)  BMI 37.18 kg/m2  LMP 03/07/2012    Weight:  Wt Readings from Last 1 Encounters:  04/01/12 220 lb (99.791 kg)          BMI: Body mass index is 37.18 kg/(m^2).  General Appearance: Alert, appropriate appearance for age. No acute distress HEENT: Grossly normal Neck / Thyroid: Supple, no masses, nodes or enlargement Lungs: clear to auscultation bilaterally Back: No CVA tenderness Breast Exam: No masses or nodes.No dimpling, nipple retraction or discharge. Cardiovascular: Regular rate and rhythm. S1, S2, no murmur Gastrointestinal: Soft, non-tender, no masses or organomegaly Pelvic Exam: Vulva and vagina appear normal. Bimanual exam reveals normal uterus and adnexa. Rectovaginal: not indicated Lymphatic Exam: Non-palpable nodes in neck, clavicular, axillary, or inguinal regions  Skin: no rash or abnormalities Neurologic: Normal gait and speech, no tremor  Psychiatric: Alert and oriented, appropriate affect.   Wet Prep:not applicable Urinalysis:not applicable UPT: Not done   Assessment:    Normal gyn exam    Plan:    pap smear return annually or prn STD screening: declined Contraception:NuvaRing vaginal inserts   Silverio Lay MD

## 2012-04-04 LAB — PAP IG W/ RFLX HPV ASCU

## 2012-04-12 ENCOUNTER — Encounter: Payer: Self-pay | Admitting: Obstetrics and Gynecology

## 2012-04-12 NOTE — Progress Notes (Signed)
Quick Note:  Please send [facility] letter. Pap in 1 year. ______ 

## 2012-04-20 ENCOUNTER — Other Ambulatory Visit (HOSPITAL_COMMUNITY): Payer: Self-pay | Admitting: Orthopedic Surgery

## 2012-04-25 ENCOUNTER — Other Ambulatory Visit (HOSPITAL_COMMUNITY): Payer: Self-pay | Admitting: Orthopedic Surgery

## 2012-04-25 DIAGNOSIS — M7520 Bicipital tendinitis, unspecified shoulder: Secondary | ICD-10-CM

## 2012-04-26 ENCOUNTER — Ambulatory Visit (INDEPENDENT_AMBULATORY_CARE_PROVIDER_SITE_OTHER): Payer: 59 | Admitting: Internal Medicine

## 2012-04-26 ENCOUNTER — Ambulatory Visit (HOSPITAL_COMMUNITY)
Admission: RE | Admit: 2012-04-26 | Discharge: 2012-04-26 | Disposition: A | Discharge status: Home / Self Care | Payer: 59 | Source: Ambulatory Visit | Attending: Orthopedic Surgery | Admitting: Orthopedic Surgery

## 2012-04-26 ENCOUNTER — Encounter: Payer: Self-pay | Admitting: Internal Medicine

## 2012-04-26 VITALS — BP 118/66 | HR 80 | Temp 98.5°F | Wt 220.0 lb

## 2012-04-26 DIAGNOSIS — R937 Abnormal findings on diagnostic imaging of other parts of musculoskeletal system: Secondary | ICD-10-CM | POA: Insufficient documentation

## 2012-04-26 DIAGNOSIS — M7520 Bicipital tendinitis, unspecified shoulder: Secondary | ICD-10-CM

## 2012-04-26 DIAGNOSIS — J45909 Unspecified asthma, uncomplicated: Secondary | ICD-10-CM

## 2012-04-26 DIAGNOSIS — J069 Acute upper respiratory infection, unspecified: Secondary | ICD-10-CM

## 2012-04-26 DIAGNOSIS — M25519 Pain in unspecified shoulder: Secondary | ICD-10-CM | POA: Insufficient documentation

## 2012-04-26 DIAGNOSIS — Z8709 Personal history of other diseases of the respiratory system: Secondary | ICD-10-CM | POA: Insufficient documentation

## 2012-04-26 NOTE — Patient Instructions (Addendum)
Use nebulizer four times as directed. Please call [place] to see the last date of tetanus immunization.

## 2012-04-26 NOTE — Progress Notes (Signed)
  Subjective:    Patient ID: Kristina Stewart, female    DOB: 1986-06-29, 26 y.o.   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 Believed she might be coming down with respiratory infection around that time but was not acutely ill. Over the weekend developed respiratory congestion and some cough but no wheezing. Has an old home nebulizer that she would like to have replaced. Out of albuterol nebulizer solution. Also, it is not clear to me when she last had a Tdap update and she will check with former physicians in [place] regarding this. She recently had GYN exam per Dr. Estanislado Pandy.  No fever or shaking chills. No shortness of breath. No sputum production.     Review of Systems     Objective:   Physical ExamSkin is warm and dry; HEENT exam: TMs and pharynx are clear. Neck is supple without adenopathy or thyromegaly. Chest clear to auscultation bilaterally without rales or wheezing             Assessment & Plan:     URI  Bronchospasm by history  Plan: New prescription for new home nebulizer machine. Albuterol nebulizer solution 2.5 mg in 3 cc one-month supply with when necessary one year refills to use 4 times a day when necessary at home.

## 2012-08-18 ENCOUNTER — Encounter: Payer: Self-pay | Admitting: Internal Medicine

## 2012-08-18 ENCOUNTER — Ambulatory Visit (INDEPENDENT_AMBULATORY_CARE_PROVIDER_SITE_OTHER): Payer: 59 | Admitting: Internal Medicine

## 2012-08-18 ENCOUNTER — Other Ambulatory Visit: Payer: Self-pay | Admitting: Internal Medicine

## 2012-08-18 ENCOUNTER — Ambulatory Visit
Admission: RE | Admit: 2012-08-18 | Discharge: 2012-08-18 | Disposition: A | Discharge status: Home / Self Care | Payer: 59 | Source: Ambulatory Visit | Attending: Internal Medicine | Admitting: Internal Medicine

## 2012-08-18 VITALS — BP 110/72 | HR 100 | Temp 100.1°F | Wt 221.0 lb

## 2012-08-18 DIAGNOSIS — D72829 Elevated white blood cell count, unspecified: Secondary | ICD-10-CM

## 2012-08-18 DIAGNOSIS — J209 Acute bronchitis, unspecified: Secondary | ICD-10-CM

## 2012-08-18 LAB — CBC WITH DIFFERENTIAL/PLATELET
Basophils Absolute: 0 10*3/uL (ref 0.0–0.1)
Basophils Relative: 0 % (ref 0–1)
Eosinophils Absolute: 0 10*3/uL (ref 0.0–0.7)
Eosinophils Relative: 0 % (ref 0–5)
Lymphocytes Relative: 6 % — ABNORMAL LOW (ref 12–46)
MCHC: 33.4 g/dL (ref 30.0–36.0)
MCV: 83.5 fL (ref 78.0–100.0)
Platelets: 346 10*3/uL (ref 150–400)
RDW: 13.8 % (ref 11.5–15.5)
WBC: 25.3 10*3/uL — ABNORMAL HIGH (ref 4.0–10.5)

## 2012-08-18 NOTE — Patient Instructions (Addendum)
Go for CXR now. Out of work until at least Monday. Use Ventolin inhaler qid. Take Levaquin 500 mg daily x 7 days

## 2012-08-19 NOTE — Progress Notes (Signed)
  Subjective:    Patient ID: Kristina Stewart, female    DOB: 1986/02/23, 27 y.o.   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 No significant sore throat. Cough is not getting better. Low-grade fever. No shaking chills. No significant sore throat or ear pain. Has had some wheezing. History of exercise-induced asthma treated with Ventolin inhaler.   Review of Systems     Objective:   Physical Exam HEENT exam: TMs are clear. Pharynx is clear. Neck is supple without significant adenopathy. Chest without wheezing  ? Rales right lower lobe Skin is warm and dry. Alert and oriented x3. Cardiac exam regular rate and rhythm  Chest x-ray shows no infiltrate. CBC with differential drawn and is pending     Assessment & Plan:  Possible early pneumonia right lower lobe despite negative chest x-ray  Bronchitis  Bronchospasm  History of exercise-induced asthma treated with inhaler  Plan: Levaquin 500 mg daily for 7 days. CBC with differential results pending. Patient has albuterol inhaler to use 2 sprays by mouth 4 times a day.  Addendum: 08/19/2012: White blood cell count elevated 25,300. Results given the patient. Patient to return on Monday for repeat CBC with differential.

## 2012-08-22 ENCOUNTER — Other Ambulatory Visit: Payer: 59 | Admitting: Internal Medicine

## 2012-08-22 LAB — CBC WITH DIFFERENTIAL/PLATELET
Hemoglobin: 14.5 g/dL (ref 12.0–15.0)
MCHC: 33 g/dL (ref 30.0–36.0)
Neutro Abs: 7.8 10*3/uL — ABNORMAL HIGH (ref 1.7–7.7)
Neutrophils Relative %: 72 % (ref 43–77)
Platelets: 360 10*3/uL (ref 150–400)
RDW: 12.6 % (ref 11.5–15.5)

## 2012-08-22 NOTE — Progress Notes (Signed)
Patient informed. 

## 2012-09-20 ENCOUNTER — Telehealth: Payer: Self-pay | Admitting: Internal Medicine

## 2012-09-20 ENCOUNTER — Encounter: Payer: Self-pay | Admitting: Internal Medicine

## 2012-09-20 ENCOUNTER — Ambulatory Visit (INDEPENDENT_AMBULATORY_CARE_PROVIDER_SITE_OTHER): Payer: 59 | Admitting: Internal Medicine

## 2012-09-20 VITALS — BP 108/76 | Temp 98.8°F | Wt 222.0 lb

## 2012-09-20 DIAGNOSIS — K047 Periapical abscess without sinus: Secondary | ICD-10-CM

## 2012-09-20 DIAGNOSIS — K044 Acute apical periodontitis of pulpal origin: Secondary | ICD-10-CM

## 2012-09-20 DIAGNOSIS — H6590 Unspecified nonsuppurative otitis media, unspecified ear: Secondary | ICD-10-CM

## 2012-09-20 DIAGNOSIS — H6593 Unspecified nonsuppurative otitis media, bilateral: Secondary | ICD-10-CM

## 2012-09-20 NOTE — Patient Instructions (Addendum)
Start amoxicillin 500 mg 3 times daily for 7 days. Keep dental appointment for extraction next week with Dr. Warren Danes and tell him you are on this medication

## 2012-09-20 NOTE — Progress Notes (Signed)
  Subjective:    Patient ID: Kristina Stewart, female    DOB: 09/27/1985, 27 y.o.   MRN: [id number]  HPI [place]. Has had some allergy symptoms with coughing and sneezing. These seem to worsen while she was staying out-of-town with cats in the home she was staying in. She also found out she has an infected tooth and needs to have an extraction next week by Dr. Warren Danes. Says her left ear hurts. She's had some sore throat. No specific cough. Call her left neck was swollen.    Review of Systems     Objective:   Physical Exam HEENT exam: Bilateral serous otitis media. Pharynx is clear. Neck is supple although tender in the left anterior cervical area I do not appreciate any significant adenopathy        Assessment & Plan:  Bilateral serous otitis media  Infected tooth  Plan amoxicillin 500 mg 3 times daily for 7 days

## 2012-09-20 NOTE — Telephone Encounter (Signed)
See at 4:15 today if she has a URI otherwise antibiotic for dental issues is up to dentist

## 2012-09-20 NOTE — Telephone Encounter (Signed)
Spoke with patient to advise that Dr. Lenord Fellers would like to see her today at 4:15.  We will work her in.  Patient verbalizes understanding and will be here.

## 2013-05-04 ENCOUNTER — Other Ambulatory Visit: Payer: Self-pay

## 2013-06-09 ENCOUNTER — Ambulatory Visit (INDEPENDENT_AMBULATORY_CARE_PROVIDER_SITE_OTHER): Payer: 59 | Admitting: Internal Medicine

## 2013-06-09 ENCOUNTER — Other Ambulatory Visit: Payer: Self-pay | Admitting: *Deleted

## 2013-06-09 ENCOUNTER — Encounter: Payer: Self-pay | Admitting: Internal Medicine

## 2013-06-09 VITALS — BP 132/88 | HR 80 | Temp 99.1°F | Ht 65.0 in | Wt 209.0 lb

## 2013-06-09 DIAGNOSIS — R21 Rash and other nonspecific skin eruption: Secondary | ICD-10-CM

## 2013-06-09 MED ORDER — AZITHROMYCIN 250 MG PO TABS: ORAL_TABLET | ORAL | Status: DC

## 2013-06-09 MED ORDER — AMCINONIDE 0.1 % EX CREA: TOPICAL_CREAM | Freq: Three times a day (TID) | CUTANEOUS | Status: DC

## 2013-11-26 ENCOUNTER — Encounter: Payer: Self-pay | Admitting: Internal Medicine

## 2013-11-26 NOTE — Patient Instructions (Addendum)
Apply Cyclocort cream as directed sparingly to right fourth finger 3 times daily

## 2013-11-26 NOTE — Progress Notes (Signed)
   Subjective:    Patient ID: Kristina Stewart, female    DOB: 1986/01/15, 28 y.o.   MRN: [id number]  HPI   Has rash right fourth which is distressing and itchy. Patient employed as a Education officer, environmental. Is not sure if this could represent contact dermatitis but doesn't have it on any other fingers.    Review of Systems     Objective:   Physical Exam Erythematous rash right fourth finger without evidence of secondary bacterial infection.        Assessment & Plan:  Plan   Eczema versus contact dermatitis  Plan: Cyclocort 0.1% cream to apply to right fourth finger 3 times a day sparingly

## 2013-12-20 ENCOUNTER — Other Ambulatory Visit: Payer: Self-pay | Admitting: Internal Medicine

## 2014-01-09 ENCOUNTER — Ambulatory Visit (INDEPENDENT_AMBULATORY_CARE_PROVIDER_SITE_OTHER): Payer: 59 | Admitting: Internal Medicine

## 2014-01-09 ENCOUNTER — Encounter: Payer: Self-pay | Admitting: Internal Medicine

## 2014-01-09 VITALS — BP 116/90 | Temp 99.2°F | Wt 221.0 lb

## 2014-01-09 DIAGNOSIS — R55 Syncope and collapse: Secondary | ICD-10-CM

## 2014-01-09 DIAGNOSIS — H8110 Benign paroxysmal vertigo, unspecified ear: Secondary | ICD-10-CM

## 2014-01-09 DIAGNOSIS — H65193 Other acute nonsuppurative otitis media, bilateral: Secondary | ICD-10-CM

## 2014-01-09 DIAGNOSIS — H8112 Benign paroxysmal vertigo, left ear: Secondary | ICD-10-CM

## 2014-01-09 DIAGNOSIS — H65199 Other acute nonsuppurative otitis media, unspecified ear: Secondary | ICD-10-CM

## 2014-01-09 MED ORDER — AMOXICILLIN 500 MG PO CAPS: 500.0000 mg | ORAL_CAPSULE | Freq: Three times a day (TID) | ORAL | Status: DC

## 2014-01-09 MED ORDER — MECLIZINE HCL 25 MG PO TABS: 25.0000 mg | ORAL_TABLET | Freq: Three times a day (TID) | ORAL | Status: DC | PRN

## 2014-01-09 NOTE — Patient Instructions (Signed)
Checked CBC and serum glucose. Take Amoxicillin 500 mg three times daily for 10 days. Take Antivert as directed for vertigo.

## 2014-01-10 LAB — CBC WITH DIFFERENTIAL/PLATELET
BASOS ABS: 0 10*3/uL (ref 0.0–0.1)
BASOS PCT: 0 % (ref 0–1)
Eosinophils Absolute: 0.1 10*3/uL (ref 0.0–0.7)
Eosinophils Relative: 1 % (ref 0–5)
HCT: 39.4 % (ref 36.0–46.0)
Hemoglobin: 13.5 g/dL (ref 12.0–15.0)
Lymphocytes Relative: 21 % (ref 12–46)
Lymphs Abs: 2.8 10*3/uL (ref 0.7–4.0)
MCH: 27.6 pg (ref 26.0–34.0)
MCHC: 34.3 g/dL (ref 30.0–36.0)
MCV: 80.4 fL (ref 78.0–100.0)
MONO ABS: 0.7 10*3/uL (ref 0.1–1.0)
Monocytes Relative: 5 % (ref 3–12)
NEUTROS ABS: 9.9 10*3/uL — AB (ref 1.7–7.7)
NEUTROS PCT: 73 % (ref 43–77)
PLATELETS: 386 10*3/uL (ref 150–400)
RBC: 4.9 MIL/uL (ref 3.87–5.11)
RDW: 13.7 % (ref 11.5–15.5)
WBC: 13.5 10*3/uL — AB (ref 4.0–10.5)

## 2014-01-10 LAB — HEMOGLOBIN A1C
HEMOGLOBIN A1C: 5.2 % (ref <5.7)
MEAN PLASMA GLUCOSE: 103 mg/dL (ref <117)

## 2014-01-10 LAB — GLUCOSE, RANDOM: Glucose, Bld: 81 mg/dL (ref 70–99)

## 2014-03-18 NOTE — Progress Notes (Signed)
   Subjective:    Patient ID: Kristina Stewart, female    DOB: 08/15/1985, 28 y.o.   MRN: [id number]  HPI  28 year old Female in today not feeling well. Complains of near syncopal episode associated with some vertigo. No fever but may have respiratory infection. No cough. No dysuria. Is concerned about possible hypoglycemia. Has some anxiety about her health. Takes Xanax for anxiety.    Review of Systems     Objective:   Physical Exam  Skin warm and dry. Nodes none. Nystagmus noted. TM slightly full but not red. Neck is supple without thyromegaly. Chest: clear to auscultation. Cardiac exam: regular rate and rhythm normal S1 and S2. Extremities without edema or deformity. Neuro: cranial nerves II through XII are grossly intact, muscle strength is normal in the upper and lower extremities, she is alert and oriented x3, deep tendon reflexes 2+ and symmetrical in the knees, sensation intact. Cerebellar testing normal. Gait is normal.      Assessment & Plan:  Vasovagal near-syncope  Vertigo  Bilateral otitis media  Plan: CBC with differential and check hemoglobin A1c to reassure her she does not have diabetes. Serum glucose. Amoxicillin 500 mg 3 times daily for 10 days. Take Antivert 25 mg 3 times daily as needed for vertigo. Call if not better in 48 hours or sooner if worse  25 minutes spent with patient

## 2014-05-15 ENCOUNTER — Ambulatory Visit (INDEPENDENT_AMBULATORY_CARE_PROVIDER_SITE_OTHER): Payer: 59 | Admitting: Internal Medicine

## 2014-05-15 ENCOUNTER — Encounter: Payer: Self-pay | Admitting: Internal Medicine

## 2014-05-15 VITALS — BP 116/80 | HR 84 | Temp 98.8°F | Ht 65.0 in | Wt 221.0 lb

## 2014-05-15 DIAGNOSIS — J069 Acute upper respiratory infection, unspecified: Secondary | ICD-10-CM

## 2014-05-15 MED ORDER — METHYLPREDNISOLONE ACETATE 80 MG/ML IJ SUSP
80.0000 mg | Freq: Once | INTRAMUSCULAR | Status: AC
  Administered 2014-05-15: 80 mg via INTRAMUSCULAR

## 2014-05-15 MED ORDER — LEVOFLOXACIN 500 MG PO TABS: 500.0000 mg | ORAL_TABLET | Freq: Every day | ORAL | Status: DC

## 2014-05-15 NOTE — Patient Instructions (Signed)
Depo-Medrol 80 mg given IM today. Take Levaquin 500 milligrams daily for 7 days. Take Sudafed PE for ear congestion

## 2014-06-05 ENCOUNTER — Telehealth: Payer: Self-pay

## 2014-06-05 NOTE — Telephone Encounter (Signed)
Left message for patient to schedule appointment to discuss MRI request/test.

## 2014-06-05 NOTE — Telephone Encounter (Signed)
Pt will need OV to discuss why she needs these tests. It is not clear to me from the chart or her message.

## 2014-06-05 NOTE — Telephone Encounter (Signed)
Patient is trying to get into a neurosurgeon, they recommended she have and MRI before they will see her.  Patient was wondering if we could order this to?  MRI of head and neck.  This is for the malformation she has.  Please advise.

## 2014-06-11 ENCOUNTER — Ambulatory Visit (INDEPENDENT_AMBULATORY_CARE_PROVIDER_SITE_OTHER): Payer: 59 | Admitting: Internal Medicine

## 2014-06-11 ENCOUNTER — Encounter: Payer: Self-pay | Admitting: Internal Medicine

## 2014-06-11 VITALS — BP 116/78 | HR 94 | Temp 99.0°F | Wt 228.0 lb

## 2014-06-11 DIAGNOSIS — Z8669 Personal history of other diseases of the nervous system and sense organs: Secondary | ICD-10-CM

## 2014-06-11 DIAGNOSIS — R519 Headache, unspecified: Secondary | ICD-10-CM

## 2014-06-11 DIAGNOSIS — R51 Headache: Secondary | ICD-10-CM

## 2014-06-11 MED ORDER — HYDROCODONE-ACETAMINOPHEN 10-325 MG PO TABS: 1.0000 | ORAL_TABLET | Freq: Three times a day (TID) | ORAL | Status: DC | PRN

## 2014-06-11 MED ORDER — AMCINONIDE 0.1 % EX CREA: TOPICAL_CREAM | Freq: Three times a day (TID) | CUTANEOUS | Status: DC

## 2014-06-11 NOTE — Progress Notes (Signed)
   Subjective:    Patient ID: Kristina Stewart, female    DOB: 05-30-1986, 28 y.o.   MRN: [id number]  HPI  Patient says that when she was 28 years old she had surgery in [place] regarding a Chiari malformation. Recently she's had headache in the occipital region, tinnitus, and scotomata. This is concerned her once again. She thinks she may have a recurrence. She says when someone touches her on the leg or chest she has felt a surge of pressure at the back of her head. On Sunday, December 6 she had a fairly severe occipital headache. She took some oxycodone she had left over from an old prescription and by Monday she felt better. However headache returned on Tuesday, December 8. She treated that with Advil and it resolved by Wednesday, December 9.  Unfortunately, I do not have any records from her surgery in [place]. Patient would like to see a neurosurgeon here for evaluation. She also wants to have an MRI of her brain and neck. We will need to see those old records first.    Review of Systems     Objective:   Physical Exam  PERRLA. Extraocular movements are full. Fundi show the distant be sharp and flat bilaterally with normal vasculature. Moves all 4 extremities. Muscle strength is 5 over 5 in the upper and lower extremities. Deep tendon reflexes 2+ and symmetrical. Gait appears to be normal. She is alert and oriented 3. Cranial nerves II through XII grossly intact      Assessment & Plan:  History of Chiari malformation status post surgery in [place] at 28 years of age-need records from that operation  Occipital headaches  Plan: Patient says she is seeking consultation with Dr. Newell CoralNudelman. She will check and see if he has received records from neurosurgeon in [place]. We will need to get a copy of those records before we can proceed ordering MRI studies. Gave her small quantity of hydrocodone/APAP 10/325 ( #30) one by mouth every 6-8 hours when necessary headache.

## 2014-06-11 NOTE — Patient Instructions (Signed)
Please see that we get records from neurosurgeon in [place]. We will need these records before proceeding with MRI imaging

## 2014-06-18 ENCOUNTER — Other Ambulatory Visit: Payer: Self-pay | Admitting: Internal Medicine

## 2014-07-05 ENCOUNTER — Encounter: Payer: Self-pay | Admitting: *Deleted

## 2014-07-05 ENCOUNTER — Encounter: Payer: 59 | Attending: Internal Medicine | Admitting: *Deleted

## 2014-07-05 VITALS — Ht 65.0 in | Wt 226.5 lb

## 2014-07-05 DIAGNOSIS — Z713 Dietary counseling and surveillance: Secondary | ICD-10-CM | POA: Diagnosis not present

## 2014-07-05 DIAGNOSIS — Z6837 Body mass index (BMI) 37.0-37.9, adult: Secondary | ICD-10-CM | POA: Diagnosis not present

## 2014-07-05 DIAGNOSIS — E669 Obesity, unspecified: Secondary | ICD-10-CM | POA: Insufficient documentation

## 2014-07-05 NOTE — Patient Instructions (Signed)
Plan:  Aim for 2 Carb Choices per meal (30 grams) +/- 1 either way  Aim for 0-1 Carbs per snack if hungry  Include protein in moderation with your meals and snacks Consider reading food labels for Total Carbohydrate of foods Consider  increasing your activity level by Arm Chair Exercises, The 7 Minute Workout APP, paddling or walking daily as tolerated

## 2014-07-05 NOTE — Progress Notes (Signed)
  Medical Nutrition Therapy:  Appt start time: 0830 end time:  1000.  Assessment:  Primary concerns today: obesity with history of IBS, asthma and reactive hypoglycemia. She lives with her husband, she shops and prepares meals. She works for [employer] and she walks 8 hours a day when at work. When she gets home, she plays with her dogs about 15 minutes daily. She states her IBS is affected by increased activity level and many types of foods so that inhibits some of her food and activity choices. 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  She experiences drops in her BG frequently at work and treats with OJ or other fast acting carbohydrate. She does have a meter and states when symptomatic, her BG is in the low 70's or the 60's.   Preferred Learning Style:   No preference indicated   Learning Readiness:   Ready  Change in progress  MEDICATIONS: see list   DIETARY INTAKE:  Avoided foods include most fruits, most beverages including milk and fruit juices, all sweeteners, and some vegetables due to IBS.    24-hr recall:  B ( AM): greek yogurt with granola and local honey OR occasionally protein shakes with coconut milk, fruit.  Snk ( AM): banana with PNB or cashew butter OR applesauce OR granola bar    L ( PM): brings from home, leftovers OR sub sandwich with [place]key, vegetables and flavored mayo Snk ( PM): no D ( PM): lean meat, whole grain starch and vegetable Snk ( PM):  Beverages: water  Usual physical activity: walks a lot at work 8 hours a day and plays with her dogs some. Has paddled kayaks in the past, expressed interest in resuming that if possible.   Estimated energy needs: 1200 calories 135 g carbohydrates 90 g protein 33 g fat  Progress Towards Goal(s):  In progress.   Nutritional Diagnosis:  NI-1.5 Excessive energy  intake As related to activity level.  As evidenced by BMI of 37.8.    Intervention:  Nutrition counseling and diabetes education initiated. Discussed Carb Counting by food group as method of portion control, reading food labels, and benefits of increased activity. I suggested options for increased activity including Arm Chair Exercise videos, The 7 Minute Workout APP and the option of kayak paddling with local groups.  Plan:  Aim for 2 Carb Choices per meal (30 grams) +/- 1 either way  Aim for 0-1 Carbs per snack if hungry  Include protein in moderation with your meals and snacks Consider reading food labels for Total Carbohydrate of foods Consider  increasing your activity level by Arm Chair Exercises, The 7 Minute Workout APP, paddling or walking daily as tolerated  Teaching Method Utilized:  Visual Auditory Hands on  Handouts given during visit include: Carb Counting and Food Label handouts Meal Plan Card Arm Chair Exercise handout  Barriers to learning/adherence to lifestyle change: none  Demonstrated degree of understanding via:  Teach Back   Monitoring/Evaluation:  Dietary intake, exercise, reading food labels, and body weight in 1 month(s).

## 2014-07-05 NOTE — Progress Notes (Signed)
Weighed on Tanita Scale for baseline of weight and body fat mass:  TANITA  BODY COMP RESULTS 07/05/14  Weight 225.0 lb   BMI (kg/m^2) 37.4   Fat Mass (lbs) 110.5 lb   Fat Free Mass (lbs) 114.5 lb   Total Body Water (lbs) 84.0 lb

## 2014-07-19 ENCOUNTER — Ambulatory Visit (INDEPENDENT_AMBULATORY_CARE_PROVIDER_SITE_OTHER): Payer: 59 | Admitting: Internal Medicine

## 2014-07-19 ENCOUNTER — Encounter: Payer: Self-pay | Admitting: Internal Medicine

## 2014-07-19 VITALS — BP 122/78 | HR 99 | Temp 98.7°F | Wt 223.0 lb

## 2014-07-19 DIAGNOSIS — J4521 Mild intermittent asthma with (acute) exacerbation: Secondary | ICD-10-CM

## 2014-07-19 DIAGNOSIS — J069 Acute upper respiratory infection, unspecified: Secondary | ICD-10-CM

## 2014-07-19 MED ORDER — FLUTICASONE-SALMETEROL 250-50 MCG/DOSE IN AEPB: 1.0000 | INHALATION_SPRAY | Freq: Two times a day (BID) | RESPIRATORY_TRACT | Status: DC

## 2014-07-19 MED ORDER — MONTELUKAST SODIUM 10 MG PO TABS: 10.0000 mg | ORAL_TABLET | Freq: Every day | ORAL | Status: DC

## 2014-07-19 MED ORDER — PREDNISONE 10 MG PO TABS: ORAL_TABLET | ORAL | Status: DC

## 2014-07-19 MED ORDER — CLARITHROMYCIN 500 MG PO TABS: 500.0000 mg | ORAL_TABLET | Freq: Two times a day (BID) | ORAL | Status: DC

## 2014-07-19 NOTE — Patient Instructions (Signed)
Use Advair inhaler one spray by mouth twice daily. Albuterol inhaler 2 sprays by mouth 4 times daily. Take Biaxin twice daily for 10 days. 12 day prednisone taper. Consider allergy testing. Use albuterol inhaler 20-30 minutes before exercise.
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
## 2014-08-09 ENCOUNTER — Ambulatory Visit: Payer: 59 | Admitting: *Deleted

## 2014-08-25 NOTE — Progress Notes (Signed)
   Subjective:    Patient ID: Kristina FillMichelle A Nicholes, female    DOB: 1986/02/20, 29 y.o.   MRN: [id number]  HPI  In today with respiratory infection symptoms with chest pressure and shortness of breath. Cough and congestion. No fever or shaking chills. Patient anxious because of chest pressure.    Review of Systems     Objective:   Physical Exam  Neck is supple without thyromegaly or adenopathy. TMs are full bilaterally. Pharynx slightly injected. Chest without rales.      Assessment & Plan:  Acute URI  Plan: Depo-Medrol 80 mg IM. Levaquin 500 milligrams daily for 7 days and take Sudafed PE for ear congestion

## 2014-10-15 ENCOUNTER — Ambulatory Visit (INDEPENDENT_AMBULATORY_CARE_PROVIDER_SITE_OTHER): Payer: 59 | Admitting: Internal Medicine

## 2014-10-15 ENCOUNTER — Encounter: Payer: Self-pay | Admitting: Internal Medicine

## 2014-10-15 VITALS — BP 128/78 | HR 98 | Temp 99.8°F | Wt 222.0 lb

## 2014-10-15 DIAGNOSIS — H6502 Acute serous otitis media, left ear: Secondary | ICD-10-CM | POA: Diagnosis not present

## 2014-10-15 DIAGNOSIS — M2669 Other specified disorders of temporomandibular joint: Secondary | ICD-10-CM

## 2014-10-15 DIAGNOSIS — M26629 Arthralgia of temporomandibular joint, unspecified side: Secondary | ICD-10-CM

## 2014-10-15 DIAGNOSIS — M542 Cervicalgia: Secondary | ICD-10-CM

## 2014-10-15 MED ORDER — CYCLOBENZAPRINE HCL 10 MG PO TABS: ORAL_TABLET | ORAL | Status: DC

## 2014-10-15 NOTE — Patient Instructions (Addendum)
Continue Augmentin as previously prescribed. Ice neck down for 20 minutes daily. Have massage as soon as possible. Flexeril at bedtime for TMJ and neck pain.

## 2014-10-27 NOTE — Progress Notes (Signed)
   Subjective:    Patient ID: Kristina Stewart, female    DOB: Jul 20, 1985, 29 y.o.   MRN: [id number]  HPI  Here today because she thinks ear infection seen recently and prescribed Augmentin elsewhere is not clearing up. Also has been having some pain TM joint. Also issues with neck pain.    Review of Systems     Objective:   Physical Exam  She does have tenderness left TM joint. Left TM is slightly full but not red. Some spasm in left sternocleidomastoid muscle area. Right TM clear. Neck is supple without adenopathy. Chest clear.      Assessment & Plan:  Left TMJ syndrome  Neck pain-take Flexeril. Ice neck down for 20 minutes daily. Have massage.  Resolving left otitis media-finish course of Augmentin

## 2014-11-29 ENCOUNTER — Ambulatory Visit: Payer: 59 | Admitting: *Deleted

## 2015-01-01 ENCOUNTER — Other Ambulatory Visit: Payer: 59 | Admitting: Internal Medicine

## 2015-01-01 DIAGNOSIS — Z0000 Encounter for general adult medical examination without abnormal findings: Secondary | ICD-10-CM

## 2015-01-01 DIAGNOSIS — Z13220 Encounter for screening for lipoid disorders: Secondary | ICD-10-CM

## 2015-01-01 DIAGNOSIS — Z1329 Encounter for screening for other suspected endocrine disorder: Secondary | ICD-10-CM

## 2015-01-01 DIAGNOSIS — Z130 Encounter for screening for diseases of the blood and blood-forming organs and certain disorders involving the immune mechanism: Secondary | ICD-10-CM

## 2015-01-01 DIAGNOSIS — Z1321 Encounter for screening for nutritional disorder: Secondary | ICD-10-CM

## 2015-01-01 LAB — CBC WITH DIFFERENTIAL/PLATELET
BASOS ABS: 0.1 10*3/uL (ref 0.0–0.1)
Basophils Relative: 1 % (ref 0–1)
Eosinophils Absolute: 0.2 10*3/uL (ref 0.0–0.7)
Eosinophils Relative: 2 % (ref 0–5)
HEMATOCRIT: 41.9 % (ref 36.0–46.0)
Hemoglobin: 13.7 g/dL (ref 12.0–15.0)
LYMPHS PCT: 23 % (ref 12–46)
Lymphs Abs: 2.4 10*3/uL (ref 0.7–4.0)
MCH: 27.5 pg (ref 26.0–34.0)
MCHC: 32.7 g/dL (ref 30.0–36.0)
MCV: 84.1 fL (ref 78.0–100.0)
MONO ABS: 0.6 10*3/uL (ref 0.1–1.0)
MONOS PCT: 6 % (ref 3–12)
MPV: 10 fL (ref 8.6–12.4)
NEUTROS ABS: 7.1 10*3/uL (ref 1.7–7.7)
Neutrophils Relative %: 68 % (ref 43–77)
PLATELETS: 328 10*3/uL (ref 150–400)
RBC: 4.98 MIL/uL (ref 3.87–5.11)
RDW: 13.8 % (ref 11.5–15.5)
WBC: 10.5 10*3/uL (ref 4.0–10.5)

## 2015-01-01 LAB — COMPLETE METABOLIC PANEL WITH GFR
ALK PHOS: 69 U/L (ref 39–117)
ALT: 12 U/L (ref 0–35)
AST: 12 U/L (ref 0–37)
Albumin: 4.2 g/dL (ref 3.5–5.2)
BILIRUBIN TOTAL: 0.5 mg/dL (ref 0.2–1.2)
BUN: 14 mg/dL (ref 6–23)
CO2: 25 meq/L (ref 19–32)
Calcium: 9.5 mg/dL (ref 8.4–10.5)
Chloride: 106 mEq/L (ref 96–112)
Creat: 0.73 mg/dL (ref 0.50–1.10)
GLUCOSE: 83 mg/dL (ref 70–99)
Potassium: 4.2 mEq/L (ref 3.5–5.3)
Sodium: 139 mEq/L (ref 135–145)
Total Protein: 6.8 g/dL (ref 6.0–8.3)

## 2015-01-01 LAB — LIPID PANEL
CHOL/HDL RATIO: 2.8 ratio
Cholesterol: 163 mg/dL (ref 0–200)
HDL: 58 mg/dL (ref >=46)
LDL Cholesterol: 84 mg/dL (ref 0–99)
Triglycerides: 104 mg/dL (ref <150)
VLDL: 21 mg/dL (ref 0–40)

## 2015-01-01 LAB — TSH: TSH: 2.608 u[IU]/mL (ref 0.350–4.500)

## 2015-01-02 LAB — VITAMIN D 25 HYDROXY (VIT D DEFICIENCY, FRACTURES): VIT D 25 HYDROXY: 34 ng/mL (ref 30–100)

## 2015-01-04 ENCOUNTER — Encounter: Payer: Self-pay | Admitting: Internal Medicine

## 2015-01-04 ENCOUNTER — Ambulatory Visit (INDEPENDENT_AMBULATORY_CARE_PROVIDER_SITE_OTHER): Payer: 59 | Admitting: Internal Medicine

## 2015-01-04 VITALS — BP 130/72 | HR 102 | Temp 99.8°F | Ht 65.0 in | Wt 221.0 lb

## 2015-01-04 DIAGNOSIS — E7212 Methylenetetrahydrofolate reductase deficiency: Secondary | ICD-10-CM | POA: Diagnosis not present

## 2015-01-04 DIAGNOSIS — Z23 Encounter for immunization: Secondary | ICD-10-CM | POA: Diagnosis not present

## 2015-01-04 DIAGNOSIS — Z0000 Encounter for general adult medical examination without abnormal findings: Secondary | ICD-10-CM | POA: Diagnosis not present

## 2015-01-04 DIAGNOSIS — Z1589 Genetic susceptibility to other disease: Secondary | ICD-10-CM

## 2015-01-04 DIAGNOSIS — J309 Allergic rhinitis, unspecified: Secondary | ICD-10-CM

## 2015-01-04 LAB — POCT URINALYSIS DIPSTICK
Bilirubin, UA: NEGATIVE
Blood, UA: NEGATIVE
Glucose, UA: NEGATIVE
Ketones, UA: NEGATIVE
Leukocytes, UA: NEGATIVE
NITRITE UA: NEGATIVE
PH UA: 6
Protein, UA: NEGATIVE
Spec Grav, UA: 1.015
Urobilinogen, UA: NEGATIVE

## 2015-01-27 NOTE — Patient Instructions (Signed)
It was pleasure to see you today. Your lab work is completely normal. Please see GYN annually.

## 2015-01-27 NOTE — Progress Notes (Signed)
   Subjective:    Patient ID: Kristina Stewart, female    DOB: 1985-09-30, 29 y.o.   MRN: [id number]  HPI 29 year old white female in today for health maintenance exam and evaluation of medical issues. History of MTHFR  gene mutation. Has seen Dr.Granfortuna does not think this is of significant concern. She had a craniotomy in May 2006 for an Arnold-Chiari malformation in [place]. She has a number of food allergies including almonds, cherries, apples and plum. She gets an itchy throat when she eats these. Is anxious about her health. History of allergic rhinitis. Had benign positional vertigo in 2013. History of TMJ syndrome.  Social history: Moved here from [place]. 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 Husband is a high school Editor, commissioning. She does not smoke. Rare alcohol consumption. No children.  Family history: Father with history of hypertension. Mother with history of hypertension. One brother in good health. One sister with history of fragile X syndrome and ADHD.      Review of Systems noncontributory      Objective:   Physical Exam  Constitutional: She is oriented to person, place, and time. She appears well-developed and well-nourished. No distress.  HENT:  Head: Normocephalic and atraumatic.  Right Ear: External ear normal.  Left Ear: External ear normal.  Mouth/Throat: Oropharynx is clear and moist. No oropharyngeal exudate.  Eyes: Conjunctivae and EOM are normal. Pupils are equal, round, and reactive to light. Right eye exhibits no discharge. Left eye exhibits no discharge.  Neck: Neck supple. No JVD present. No thyromegaly present.  Cardiovascular: Normal rate, regular rhythm, normal heart sounds and intact distal pulses.   No murmur heard. Pulmonary/Chest: Effort normal and breath sounds normal. No respiratory distress. She has no wheezes. She has no rales. She exhibits no tenderness.  Abdominal: Soft. Bowel sounds are normal. She exhibits no distension and  no mass. There is no tenderness. There is no rebound and no guarding.  Genitourinary:  Deferred to GYN. Last Pap 2013 had ASCUS  Musculoskeletal: She exhibits no edema.  Lymphadenopathy:    She has no cervical adenopathy.  Neurological: She is alert and oriented to person, place, and time. She has normal reflexes. No cranial nerve deficit. Coordination normal.  Skin: Skin is warm and dry. No rash noted. She is not diaphoretic.  Psychiatric: She has a normal mood and affect. Her behavior is normal. Judgment and thought content normal.  Vitals reviewed.         Assessment & Plan:  Normal health maintenance exam  History of craniotomy for Arnold-Chiari malformation in [place] in May 2006  History of homozygosity MT HFR allele-seen by Dr. Cyndie Chime who does not think she needs chronic anticoagulation  Anxiety  Allergic rhinitis  Plan: Return in 2 years or as needed. Annual GYN exam recommended.

## 2015-02-25 ENCOUNTER — Telehealth: Payer: Self-pay | Admitting: *Deleted

## 2015-02-25 ENCOUNTER — Other Ambulatory Visit: Payer: Self-pay | Admitting: *Deleted

## 2015-02-25 MED ORDER — ALPRAZOLAM 0.25 MG PO TABS: 0.2500 mg | ORAL_TABLET | Freq: Two times a day (BID) | ORAL | Status: DC | PRN

## 2015-02-25 NOTE — Telephone Encounter (Signed)
Xanax refilled per Dr Lenord Fellers

## 2015-02-25 NOTE — Telephone Encounter (Signed)
Patient states she has been using her xanax when traveling for vertigo she is out and would like a refill called into [facility].

## 2015-05-21 ENCOUNTER — Telehealth: Payer: 59 | Admitting: Family

## 2015-05-21 DIAGNOSIS — A049 Bacterial intestinal infection, unspecified: Secondary | ICD-10-CM

## 2015-05-21 MED ORDER — CIPROFLOXACIN HCL 500 MG PO TABS: 500.0000 mg | ORAL_TABLET | Freq: Two times a day (BID) | ORAL | Status: DC

## 2015-05-21 NOTE — Progress Notes (Signed)
We are sorry that you are not feeling well.  Here is how we plan to help!  Based on what you have shared with me it looks like you have Acute Infectious Diarrhea.  Most cases of acute diarrhea are due to infections with virus and bacteria and are self-limited conditions lasting less than 14 days.  For your symptoms you may take Imodium 2 mg tablets that are over the counter at your local pharmacy. Take two tablet now and then one after each loose stool up to 6 a day.  Antibiotics are not needed for most people with diarrhea.   Optional: I have sent in Cipro 500 mg two tablets twice a day for five days   HOME CARE  We recommend changing your diet to help with your symptoms for the next few days.  Drink plenty of fluids that contain water salt and sugar. Sports drinks such as Gatorade may help.   You may try broths, soups, bananas, applesauce, soft breads, mashed potatoes or crackers.   You are considered infectious for as long as the diarrhea continues. Hand washing or use of alcohol based hand sanitizers is recommend.  It is best to stay out of work or school until your symptoms stop.   GET HELP RIGHT AWAY  If you have dark yellow colored urine or do not pass urine frequently you should drink more fluids.    If your symptoms worsen   If you feel like you are going to pass out (faint)  You have a new problem  MAKE SURE YOU   Understand these instructions.  Will watch your condition.  Will get help right away if you are not doing well or get worse.  Your e-visit answers were reviewed by a board certified advanced clinical practitioner to complete your personal care plan.  Depending on the condition, your plan could have included both over the counter or prescription medications.  If there is a problem please reply  once you have received a response from your provider.  Your safety is important to us.  If you have drug allergies check your prescription carefully.    You  can use MyChart to ask questions about today's visit, request a non-urgent call back, or ask for a work or school excuse for 24 hours related to this e-Visit. If it has been greater than 24 hours you will need to follow up with your provider, or enter a new e-Visit to address those concerns.   You will get an e-mail in the next two days asking about your experience.  I hope that your e-visit has been valuable and will speed your recovery. Thank you for using e-visits.   

## 2015-06-07 ENCOUNTER — Telehealth: Payer: 59 | Admitting: Family

## 2015-06-07 DIAGNOSIS — J0120 Acute ethmoidal sinusitis, unspecified: Secondary | ICD-10-CM

## 2015-06-07 MED ORDER — FLUTICASONE PROPIONATE 50 MCG/ACT NA SUSP: 2.0000 | Freq: Every day | NASAL | Status: DC

## 2015-06-07 NOTE — Progress Notes (Signed)

## 2015-07-18 MED FILL — FLUTICASONE PROP 50 MCG SPR: 50 | 30 days supply | Qty: 16 | Fill #0

## 2015-07-21 ENCOUNTER — Telehealth: Payer: 59 | Admitting: Physician Assistant

## 2015-07-21 DIAGNOSIS — B9689 Other specified bacterial agents as the cause of diseases classified elsewhere: Secondary | ICD-10-CM

## 2015-07-21 DIAGNOSIS — J019 Acute sinusitis, unspecified: Secondary | ICD-10-CM | POA: Diagnosis not present

## 2015-07-21 MED ORDER — AMOXICILLIN-POT CLAVULANATE 875-125 MG PO TABS: 1.0000 | ORAL_TABLET | Freq: Two times a day (BID) | ORAL | Status: DC

## 2015-07-21 NOTE — Progress Notes (Signed)

## 2015-07-22 MED FILL — AMOX-CLAV 875-125 MG TABLET: 875-125 | 7 days supply | Qty: 14 | Fill #0

## 2015-07-25 DIAGNOSIS — H538 Other visual disturbances: Secondary | ICD-10-CM | POA: Diagnosis not present

## 2015-11-19 MED FILL — FLUTICASONE PROP 50 MCG SPR: 50 | 30 days supply | Qty: 16 | Fill #1

## 2016-02-06 DIAGNOSIS — Z3491 Encounter for supervision of normal pregnancy, unspecified, first trimester: Secondary | ICD-10-CM | POA: Diagnosis not present

## 2016-02-13 MED FILL — VENTOLIN HFA 90 MCG INHALER: 108 (90 BAS | 25 days supply | Qty: 18 | Fill #1

## 2016-02-23 ENCOUNTER — Encounter (HOSPITAL_COMMUNITY): Payer: Self-pay | Admitting: *Deleted

## 2016-02-23 ENCOUNTER — Inpatient Hospital Stay (HOSPITAL_COMMUNITY): Payer: 59

## 2016-02-23 ENCOUNTER — Inpatient Hospital Stay (HOSPITAL_COMMUNITY)
Admission: AD | Admit: 2016-02-23 | Discharge: 2016-02-23 | Disposition: A | Discharge status: Home / Self Care | Payer: 59 | Source: Ambulatory Visit | Attending: Obstetrics & Gynecology | Admitting: Obstetrics & Gynecology

## 2016-02-23 DIAGNOSIS — O99511 Diseases of the respiratory system complicating pregnancy, first trimester: Secondary | ICD-10-CM | POA: Diagnosis not present

## 2016-02-23 DIAGNOSIS — O26851 Spotting complicating pregnancy, first trimester: Secondary | ICD-10-CM | POA: Diagnosis not present

## 2016-02-23 DIAGNOSIS — O283 Abnormal ultrasonic finding on antenatal screening of mother: Secondary | ICD-10-CM | POA: Diagnosis not present

## 2016-02-23 DIAGNOSIS — O209 Hemorrhage in early pregnancy, unspecified: Secondary | ICD-10-CM | POA: Diagnosis not present

## 2016-02-23 DIAGNOSIS — Z3A09 9 weeks gestation of pregnancy: Secondary | ICD-10-CM | POA: Diagnosis not present

## 2016-02-23 DIAGNOSIS — J45909 Unspecified asthma, uncomplicated: Secondary | ICD-10-CM | POA: Insufficient documentation

## 2016-02-23 DIAGNOSIS — O99211 Obesity complicating pregnancy, first trimester: Secondary | ICD-10-CM | POA: Insufficient documentation

## 2016-02-23 DIAGNOSIS — O99611 Diseases of the digestive system complicating pregnancy, first trimester: Secondary | ICD-10-CM | POA: Insufficient documentation

## 2016-02-23 DIAGNOSIS — O021 Missed abortion: Secondary | ICD-10-CM | POA: Diagnosis not present

## 2016-02-23 DIAGNOSIS — Z3A01 Less than 8 weeks gestation of pregnancy: Secondary | ICD-10-CM | POA: Diagnosis not present

## 2016-02-23 LAB — URINALYSIS, ROUTINE W REFLEX MICROSCOPIC
Bilirubin Urine: NEGATIVE
GLUCOSE, UA: NEGATIVE mg/dL
Ketones, ur: NEGATIVE mg/dL
LEUKOCYTES UA: NEGATIVE
Nitrite: NEGATIVE
PH: 6 (ref 5.0–8.0)
PROTEIN: NEGATIVE mg/dL
SPECIFIC GRAVITY, URINE: 1.01 (ref 1.005–1.030)

## 2016-02-23 LAB — WET PREP, GENITAL
Clue Cells Wet Prep HPF POC: NONE SEEN
TRICH WET PREP: NONE SEEN
Yeast Wet Prep HPF POC: NONE SEEN

## 2016-02-23 LAB — CBC
HCT: 37.1 % (ref 36.0–46.0)
HEMOGLOBIN: 12.8 g/dL (ref 12.0–15.0)
MCH: 28.3 pg (ref 26.0–34.0)
MCHC: 34.5 g/dL (ref 30.0–36.0)
MCV: 81.9 fL (ref 78.0–100.0)
PLATELETS: 311 10*3/uL (ref 150–400)
RBC: 4.53 MIL/uL (ref 3.87–5.11)
RDW: 13.6 % (ref 11.5–15.5)
WBC: 13 10*3/uL — AB (ref 4.0–10.5)

## 2016-02-23 LAB — URINE MICROSCOPIC-ADD ON
BACTERIA UA: NONE SEEN
WBC UA: NONE SEEN WBC/hpf (ref 0–5)

## 2016-02-23 LAB — ABO/RH: ABO/RH(D): O POS

## 2016-02-23 LAB — HCG, QUANTITATIVE, PREGNANCY: hCG, Beta Chain, Quant, S: 9991 m[IU]/mL — ABNORMAL HIGH (ref <5)

## 2016-02-23 LAB — POCT PREGNANCY, URINE: Preg Test, Ur: POSITIVE — AB

## 2016-02-23 NOTE — Discharge Instructions (Signed)

## 2016-02-23 NOTE — MAU Provider Note (Signed)
History     CSN: [id number]  Arrival date and time: 02/23/16 1721   First Provider Initiated Contact with Patient 02/23/16 1759      Chief Complaint  Patient presents with  . Vaginal Bleeding   Kristina Stewart is a 30 y.o. G1P0 at [redacted]w[redacted]d by sure LMP with irregular cycles. Initially noted scant spotting 4 days ago which continued until today she began bleeding is heavier and bright red. Passed 1 thumbnail-sized clot. She describes it as heavy as the beginning of a period, but she has not fully saturated 1 pad all day. No antecedent intercourse Denies abdominal pain or cramping.  Pregnancy course otherwise uncomplicated. NOB labs were done and exam scheduled for this week.     Vaginal Bleeding  The patient's primary symptoms include vaginal bleeding. The patient's pertinent negatives include no genital itching, genital lesions, genital odor or vaginal discharge. This is a new problem. The current episode started in the past 7 days. The problem occurs intermittently. The problem has been gradually worsening. The patient is experiencing no pain. Associated symptoms include nausea. Pertinent negatives include no dysuria, fever, frequency, hematuria or urgency. The vaginal discharge was normal. The vaginal bleeding is typical of menses. She has not been passing clots. She has not been passing tissue. Nothing aggravates the symptoms. She has tried nothing for the symptoms. No, her partner does not have an STD. Her menstrual history has been irregular.     Past Medical History:  Diagnosis Date  . Allergy   . Asthma   . Blood dyscrasia   . IBS (irritable bowel syndrome)   . Obesity   . Seasonal allergic rhinitis   . Strep pharyngitis   . TMJ (temporomandibular joint syndrome)     Past Surgical History:  Procedure Laterality Date  . BRAIN SURGERY     craniotomy/arnold chiari malformation  . BRAIN SURGERY     craniotomy/arnold chiari malformation    Family History  Problem  Relation Age of Onset  . Hypertension Mother   . Thyroid disease Mother   . Hypertension Father   . Irritable bowel syndrome Father   . GER disease Sister   . Alzheimer's disease Paternal Grandfather   . Diabetes Paternal Grandmother   . Fibromyalgia Paternal Grandmother   . Thyroid disease Maternal Grandmother     HYPOTHYROID  . Hypertension Maternal Grandmother   . Cancer Maternal Grandmother     LUNG  . Cancer Maternal Grandfather     LUNG    Social History  Substance Use Topics  . Smoking status: Never Smoker  . Smokeless tobacco: Never Used  . Alcohol use 2.4 oz/week    4 Glasses of wine per week    Allergies:  Allergies  Allergen Reactions  . Latex Rash  . Latex   . Other     PT IS ALLERGIC PITTED FRUITS AND ALMONDS    Prescriptions Prior to Admission  Medication Sig Dispense Refill Last Dose  . fluticasone (FLONASE) 50 MCG/ACT nasal spray Place 2 sprays into both nostrils daily. 16 g 6 Past Week at Unknown time  . VENTOLIN HFA 108 (90 BASE) MCG/ACT inhaler INHALE 2 PUFFS BY MOUTH FOUR TIMES DAILY AS NEEDED FOR WHEEZING 18 g 0 Past Week at Unknown time  . albuterol (PROVENTIL) (2.5 MG/3ML) 0.083% nebulizer solution Take 2.5 mg by nebulization every 6 (six) hours as needed.   Taking  . ALPRAZolam (XANAX) 0.25 MG tablet Take 1 tablet (0.25 mg total) by mouth 2 (two)  times daily as needed for sleep. 30 tablet 2   . amcinonide (CYCLOCORT) 0.1 % cream USE ON HANDS THREE TIMES A DAY SPARINGLY 60 g 2 Taking  . amoxicillin-clavulanate (AUGMENTIN) 875-125 MG tablet Take 1 tablet by mouth 2 (two) times daily. 14 tablet 0   . cetirizine (ZYRTEC) 10 MG tablet Take 10 mg by mouth daily.   Taking  . cyclobenzaprine (FLEXERIL) 10 MG tablet 1/2-1 tablet at bedtime for neck pain and TMJ syndrome 90 tablet 0 Taking  . etonogestrel-ethinyl estradiol (NUVARING) 0.12-0.015 MG/24HR vaginal ring Insert vaginally and leave in place for 3 consecutive weeks, then remove for 1 week. 1 each 12  Taking  . fexofenadine-pseudoephedrine (ALLEGRA-D) 60-120 MG per tablet Take 1 tablet by mouth 2 (two) times daily.   Taking  . Fluticasone-Salmeterol (ADVAIR DISKUS) 250-50 MCG/DOSE AEPB Inhale 1 puff into the lungs every 12 (twelve) hours. 1 each 5 More than a month at Unknown time  . HYDROcodone-acetaminophen (NORCO) 10-325 MG per tablet Take 1 tablet by mouth every 8 (eight) hours as needed. 30 tablet 0 Taking  . montelukast (SINGULAIR) 10 MG tablet Take 1 tablet (10 mg total) by mouth at bedtime. 30 tablet 5 Taking  . phenylephrine (SUDAFED PE) 10 MG TABS tablet Take 10 mg by mouth every 6 (six) hours as needed.   Taking    Review of Systems  Constitutional: Negative for fever.  Gastrointestinal: Positive for nausea.  Genitourinary: Positive for vaginal bleeding. Negative for dysuria, frequency, hematuria, urgency and vaginal discharge.   Physical Exam   Blood pressure 113/71, pulse 91, temperature 98.7 F (37.1 C), temperature source Oral, resp. rate 18, last menstrual period 12/20/2015.  Physical Exam  Nursing note and vitals reviewed. Constitutional: She is oriented to person, place, and time. She appears well-developed and well-nourished. No distress.  HENT:  Head: Normocephalic.  Eyes: Pupils are equal, round, and reactive to light.  Neck: Normal range of motion.  Cardiovascular: Normal rate.   Respiratory: Effort normal.  GI: Soft. There is no tenderness.  Genitourinary: No vaginal discharge found.  Genitourinary Comments: Spec:No vaginal or cervical lesions. Cx not friable.  Scant dark blood swabbed from vault; no active bleeding noted  Bimanual: Cx L/C; 6-8 wk size  Musculoskeletal: Normal range of motion.  Neurological: She is alert and oriented to person, place, and time.  Skin: Skin is warm and dry.  Psychiatric: She has a normal mood and affect. Her behavior is normal. Judgment normal.    MAU Course  Procedures Results for orders placed or performed during the  hospital encounter of 02/23/16 (from the past 24 hour(s))  Urinalysis, Routine w reflex microscopic (not at [facility])     Status: Abnormal   Collection Time: 02/23/16  5:30 PM  Result Value Ref Range   Color, Urine YELLOW YELLOW   APPearance CLEAR CLEAR   Specific Gravity, Urine 1.010 1.005 - 1.030   pH 6.0 5.0 - 8.0   Glucose, UA NEGATIVE NEGATIVE mg/dL   Hgb urine dipstick LARGE (A) NEGATIVE   Bilirubin Urine NEGATIVE NEGATIVE   Ketones, ur NEGATIVE NEGATIVE mg/dL   Protein, ur NEGATIVE NEGATIVE mg/dL   Nitrite NEGATIVE NEGATIVE   Leukocytes, UA NEGATIVE NEGATIVE  Urine microscopic-add on     Status: Abnormal   Collection Time: 02/23/16  5:30 PM  Result Value Ref Range   Squamous Epithelial / LPF 0-5 (A) NONE SEEN   WBC, UA NONE SEEN 0 - 5 WBC/hpf   RBC / HPF 6-30 0 -  5 RBC/hpf   Bacteria, UA NONE SEEN NONE SEEN  Pregnancy, urine POC     Status: Abnormal   Collection Time: 02/23/16  5:34 PM  Result Value Ref Range   Preg Test, Ur POSITIVE (A) NEGATIVE  CBC     Status: Abnormal   Collection Time: 02/23/16  6:22 PM  Result Value Ref Range   WBC 13.0 (H) 4.0 - 10.5 K/uL   RBC 4.53 3.87 - 5.11 MIL/uL   Hemoglobin 12.8 12.0 - 15.0 g/dL   HCT 16.137.1 09.636.0 - 04.546.0 %   MCV 81.9 78.0 - 100.0 fL   MCH 28.3 26.0 - 34.0 pg   MCHC 34.5 30.0 - 36.0 g/dL   RDW 40.913.6 81.111.5 - 91.415.5 %   Platelets 311 150 - 400 K/uL  ABO/Rh     Status: None (Preliminary result)   Collection Time: 02/23/16  6:22 PM  Result Value Ref Range   ABO/RH(D) O POS   hCG, quantitative, pregnancy     Status: Abnormal   Collection Time: 02/23/16  6:22 PM  Result Value Ref Range   hCG, Beta Chain, Quant, S 9,991 (H) <5 mIU/mL  Wet prep, genital     Status: Abnormal   Collection Time: 02/23/16  7:15 PM  Result Value Ref Range   Yeast Wet Prep HPF POC NONE SEEN NONE SEEN   Trich, Wet Prep NONE SEEN NONE SEEN   Clue Cells Wet Prep HPF POC NONE SEEN NONE SEEN   WBC, Wet Prep HPF POC MODERATE (A) NONE SEEN   Sperm NOT DONE      Preliminary US report: single IUGS, no YS, embryo present with CRL 1.40 c/w 1818w4d, no cardiac activity  MDM Discussed EPF with couple and offered expectant management or cytotec. They elect to wait 2 days and keep OB appointment at [facility]   Assessment and Plan  G1 at 3841w2d with IUP size c/w 4818w4d 1. Embryonic demise   2. Bleeding in early pregnancy   .   Medication List    STOP taking these medications   ALPRAZolam 0.25 MG tablet Commonly known as:  XANAX   amcinonide 0.1 % cream Commonly known as:  CYCLOCORT   amoxicillin-clavulanate 875-125 MG tablet Commonly known as:  AUGMENTIN   cetirizine 10 MG tablet Commonly known as:  ZYRTEC   cyclobenzaprine 10 MG tablet Commonly known as:  FLEXERIL   etonogestrel-ethinyl estradiol 0.12-0.015 MG/24HR vaginal ring Commonly known as:  NUVARING   fexofenadine-pseudoephedrine 60-120 MG 12 hr tablet Commonly known as:  ALLEGRA-D   fluticasone 50 MCG/ACT nasal spray Commonly known as:  FLONASE   HYDROcodone-acetaminophen 10-325 MG tablet Commonly known as:  NORCO   phenylephrine 10 MG Tabs tablet Commonly known as:  SUDAFED PE     TAKE these medications   albuterol (2.5 MG/3ML) 0.083% nebulizer solution Commonly known as:  PROVENTIL Take 2.5 mg by nebulization every 6 (six) hours as needed. What changed:  Another medication with the same name was removed. Continue taking this medication, and follow the directions you see here.   Fluticasone-Salmeterol 250-50 MCG/DOSE Aepb Commonly known as:  ADVAIR DISKUS Inhale 1 puff into the lungs every 12 (twelve) hours.   montelukast 10 MG tablet Commonly known as:  SINGULAIR Take 1 tablet (10 mg total) by mouth at bedtime.      Follow-up Information    Janine LimboSTRINGER,ARTHUR V, MD Follow up on 02/27/2016.   Specialty:  Obstetrics and Gynecology Contact information: [address] [place] [address]  [phone]          Elide Stalzer 02/23/2016, 7:14 PM

## 2016-02-23 NOTE — MAU Note (Signed)
Pt reports spotting on Thursday - Saturday. Today the bleeding is increases. Pt reports cramping , but she has had this cramping throughout her pregnancy.

## 2016-02-24 LAB — GC/CHLAMYDIA PROBE AMP (~~LOC~~) NOT AT ARMC
Chlamydia: NEGATIVE
Neisseria Gonorrhea: NEGATIVE

## 2016-02-24 LAB — HIV ANTIBODY (ROUTINE TESTING W REFLEX): HIV Screen 4th Generation wRfx: NONREACTIVE

## 2016-02-26 DIAGNOSIS — O039 Complete or unspecified spontaneous abortion without complication: Secondary | ICD-10-CM | POA: Diagnosis not present

## 2016-02-26 MED FILL — miSOPROStol 200 MCG TABS: 200 | 1 days supply | Qty: 2 | Fill #0

## 2016-02-28 ENCOUNTER — Encounter (HOSPITAL_COMMUNITY): Payer: Self-pay | Admitting: *Deleted

## 2016-02-28 ENCOUNTER — Inpatient Hospital Stay (HOSPITAL_COMMUNITY)
Admission: AD | Admit: 2016-02-28 | Discharge: 2016-02-28 | Disposition: A | Discharge status: Home / Self Care | Payer: 59 | Source: Ambulatory Visit | Attending: Obstetrics and Gynecology | Admitting: Obstetrics and Gynecology

## 2016-02-28 DIAGNOSIS — Z3A10 10 weeks gestation of pregnancy: Secondary | ICD-10-CM | POA: Insufficient documentation

## 2016-02-28 DIAGNOSIS — O99511 Diseases of the respiratory system complicating pregnancy, first trimester: Secondary | ICD-10-CM | POA: Insufficient documentation

## 2016-02-28 DIAGNOSIS — Z79899 Other long term (current) drug therapy: Secondary | ICD-10-CM | POA: Diagnosis not present

## 2016-02-28 DIAGNOSIS — O99211 Obesity complicating pregnancy, first trimester: Secondary | ICD-10-CM | POA: Insufficient documentation

## 2016-02-28 DIAGNOSIS — Z9889 Other specified postprocedural states: Secondary | ICD-10-CM | POA: Insufficient documentation

## 2016-02-28 DIAGNOSIS — O021 Missed abortion: Secondary | ICD-10-CM | POA: Insufficient documentation

## 2016-02-28 DIAGNOSIS — O99611 Diseases of the digestive system complicating pregnancy, first trimester: Secondary | ICD-10-CM | POA: Insufficient documentation

## 2016-02-28 DIAGNOSIS — O039 Complete or unspecified spontaneous abortion without complication: Secondary | ICD-10-CM | POA: Insufficient documentation

## 2016-02-28 DIAGNOSIS — Z888 Allergy status to other drugs, medicaments and biological substances status: Secondary | ICD-10-CM | POA: Insufficient documentation

## 2016-02-28 DIAGNOSIS — R102 Pelvic and perineal pain: Secondary | ICD-10-CM | POA: Diagnosis present

## 2016-02-28 MED ORDER — PROMETHAZINE HCL 25 MG PO TABS: 12.5000 mg | ORAL_TABLET | Freq: Four times a day (QID) | ORAL | 0 refills | Status: DC | PRN

## 2016-02-28 MED ORDER — PROMETHAZINE HCL 25 MG PO TABS: 12.5000 mg | ORAL_TABLET | Freq: Once | ORAL | Status: DC

## 2016-02-28 MED ORDER — HYDROCODONE-ACETAMINOPHEN 5-325 MG PO TABS: 1.0000 | ORAL_TABLET | ORAL | 0 refills | Status: DC | PRN

## 2016-02-28 MED ORDER — OXYCODONE-ACETAMINOPHEN 5-325 MG PO TABS
2.0000 | ORAL_TABLET | Freq: Once | ORAL | Status: AC
Start: 2016-02-28 — End: 2016-02-28
  Administered 2016-02-28: 2 via ORAL
  Filled 2016-02-28: qty 2

## 2016-02-28 MED ORDER — PROMETHAZINE HCL 25 MG PO TABS
25.0000 mg | ORAL_TABLET | Freq: Once | ORAL | Status: AC
  Administered 2016-02-28: 25 mg via ORAL
  Filled 2016-02-28: qty 1

## 2016-02-28 MED ORDER — KETOROLAC TROMETHAMINE 60 MG/2ML IM SOLN
60.0000 mg | Freq: Once | INTRAMUSCULAR | Status: AC
  Administered 2016-02-28: 60 mg via INTRAMUSCULAR
  Filled 2016-02-28: qty 2

## 2016-02-28 NOTE — Discharge Instructions (Signed)
Miscarriage  A miscarriage is the sudden loss of an unborn baby (fetus) before the 20th week of pregnancy. Most miscarriages happen in the first 3 months of pregnancy. Sometimes, it happens before a woman even knows she is pregnant. A miscarriage is also called a "spontaneous miscarriage" or "early pregnancy loss." Having a miscarriage can be an emotional experience. Talk with your caregiver about any questions you may have about miscarrying, the grieving process, and your future pregnancy plans.  CAUSES    Problems with the fetal chromosomes that make it impossible for the baby to develop normally. Problems with the baby's genes or chromosomes are most often the result of errors that occur, by chance, as the embryo divides and grows. The problems are not inherited from the parents.   Infection of the cervix or uterus.    Hormone problems.    Problems with the cervix, such as having an incompetent cervix. This is when the tissue in the cervix is not strong enough to hold the pregnancy.    Problems with the uterus, such as an abnormally shaped uterus, uterine fibroids, or congenital abnormalities.    Certain medical conditions.    Smoking, drinking alcohol, or taking illegal drugs.    Trauma.   Often, the cause of a miscarriage is unknown.   SYMPTOMS    Vaginal bleeding or spotting, with or without cramps or pain.   Pain or cramping in the abdomen or lower back.   Passing fluid, tissue, or blood clots from the vagina.  DIAGNOSIS   Your caregiver will perform a physical exam. You may also have an ultrasound to confirm the miscarriage. Blood or urine tests may also be ordered.  TREATMENT    Sometimes, treatment is not necessary if you naturally pass all the fetal tissue that was in the uterus. If some of the fetus or placenta remains in the body (incomplete miscarriage), tissue left behind may become infected and must be removed. Usually, a dilation and curettage (D and C) procedure is performed.  During a D and C procedure, the cervix is widened (dilated) and any remaining fetal or placental tissue is gently removed from the uterus.   Antibiotic medicines are prescribed if there is an infection. Other medicines may be given to reduce the size of the uterus (contract) if there is a lot of bleeding.   If you have Rh negative blood and your baby was Rh positive, you will need a Rh immunoglobulin shot. This shot will protect any future baby from having Rh blood problems in future pregnancies.  HOME CARE INSTRUCTIONS    Your caregiver may order bed rest or may allow you to continue light activity. Resume activity as directed by your caregiver.   Have someone help with home and family responsibilities during this time.    Keep track of the number of sanitary pads you use each day and how soaked (saturated) they are. Write down this information.    Do not use tampons. Do not douche or have sexual intercourse until approved by your caregiver.    Only take over-the-counter or prescription medicines for pain or discomfort as directed by your caregiver.    Do not take aspirin. Aspirin can cause bleeding.    Keep all follow-up appointments with your caregiver.    If you or your partner have problems with grieving, talk to your caregiver or seek counseling to help cope with the pregnancy loss. Allow enough time to grieve before trying to get pregnant again.     SEEK IMMEDIATE MEDICAL CARE IF:    You have severe cramps or pain in your back or abdomen.   You have a fever.   You pass large blood clots (walnut-sized or larger) ortissue from your vagina. Save any tissue for your caregiver to inspect.    Your bleeding increases.    You have a thick, bad-smelling vaginal discharge.   You become lightheaded, weak, or you faint.    You have chills.   MAKE SURE YOU:   Understand these instructions.   Will watch your condition.   Will get help right away if you are not doing well or get worse.     This  information is not intended to replace advice given to you by your health care provider. Make sure you discuss any questions you have with your health care provider.     Document Released: 12/09/2000 Document Revised: 10/10/2012 Document Reviewed: 08/04/2011  [employer]vier Interactive Patient Education 2016 [employer]

## 2016-02-28 NOTE — MAU Provider Note (Signed)
History     CSN: [id number]  Arrival date and time: 02/28/16 0206   First Provider Initiated Contact with Patient 02/28/16 0229      Chief Complaint  Patient presents with  . Pelvic Pain   Kristina Stewart is a 30 y.o. G1P0 at [redacted]w[redacted]d who presents today with cramping and vaginal bleeding. She was dx with a missed abortion on 02/23/16, and on 02/26/16 was given 400mcg of cytotec. She was told to take ibuprofen for the pain. She reports that the ibuprofen has not helped with the pain. She reports that she had some heavier bleeding during the day. She states that it is less now. She is unsure if she has passed any tissue. She has a FU appointment tomorrow at 9:15 am.   Pelvic Pain  The patient's primary symptoms include pelvic pain. This is a new problem. The current episode started yesterday. The problem occurs constantly. The problem has been unchanged. The problem affects both sides. She is pregnant (documented failed IUP in first trimester ). Associated symptoms include abdominal pain. Pertinent negatives include no chills, constipation, diarrhea, dysuria, fever, frequency, nausea, urgency or vomiting. The vaginal discharge was bloody. The vaginal bleeding is typical of menses. She has been passing clots. She has been passing tissue.    Past Medical History:  Diagnosis Date  . Allergy   . Asthma   . Blood dyscrasia   . IBS (irritable bowel syndrome)   . Obesity   . Seasonal allergic rhinitis   . Strep pharyngitis   . TMJ (temporomandibular joint syndrome)     Past Surgical History:  Procedure Laterality Date  . BRAIN SURGERY     craniotomy/arnold chiari malformation  . BRAIN SURGERY     craniotomy/arnold chiari malformation    Family History  Problem Relation Age of Onset  . Hypertension Mother   . Thyroid disease Mother   . Hypertension Father   . Irritable bowel syndrome Father   . GER disease Sister   . Alzheimer's disease Paternal Grandfather   . Diabetes Paternal  Grandmother   . Fibromyalgia Paternal Grandmother   . Thyroid disease Maternal Grandmother     HYPOTHYROID  . Hypertension Maternal Grandmother   . Cancer Maternal Grandmother     LUNG  . Cancer Maternal Grandfather     LUNG    Social History  Substance Use Topics  . Smoking status: Never Smoker  . Smokeless tobacco: Never Used  . Alcohol use 2.4 oz/week    4 Glasses of wine per week    Allergies:  Allergies  Allergen Reactions  . Latex Rash  . Latex   . Other     PT IS ALLERGIC PITTED FRUITS AND ALMONDS    Prescriptions Prior to Admission  Medication Sig Dispense Refill Last Dose  . albuterol (PROVENTIL) (2.5 MG/3ML) 0.083% nebulizer solution Take 2.5 mg by nebulization every 6 (six) hours as needed.   Taking  . Fluticasone-Salmeterol (ADVAIR DISKUS) 250-50 MCG/DOSE AEPB Inhale 1 puff into the lungs every 12 (twelve) hours. 1 each 5 More than a month at Unknown time  . montelukast (SINGULAIR) 10 MG tablet Take 1 tablet (10 mg total) by mouth at bedtime. 30 tablet 5 Taking    Review of Systems  Constitutional: Negative for chills and fever.  Gastrointestinal: Positive for abdominal pain. Negative for constipation, diarrhea, nausea and vomiting.  Genitourinary: Positive for pelvic pain. Negative for dysuria, frequency and urgency.   Physical Exam   Blood pressure 151/86, pulse  102, temperature 98.2 F (36.8 C), temperature source Oral, resp. rate 18, last menstrual period 12/20/2015, SpO2 100 %.  Physical Exam  Nursing note and vitals reviewed. Constitutional: She is oriented to person, place, and time. She appears well-developed and well-nourished. No distress.  HENT:  Head: Normocephalic.  Cardiovascular: Normal rate.   Respiratory: Effort normal.  GI: Soft. There is no tenderness. There is no rebound.  Genitourinary:  Genitourinary Comments:  External: no lesion Vagina: small amount of blood seen  Cervix: pink, smooth, no CMT Uterus: NSSC Adnexa: NT    Neurological: She is alert and oriented to person, place, and time.  Skin: Skin is warm and dry.  Psychiatric: She has a normal mood and affect.    MAU Course  Procedures  MDM Patient give percocet and phenergan here. She reports that her pain is only slightly better.  Patient given toradol IM she reports that her pain is better at this time.   Assessment and Plan   1. SAB (spontaneous abortion)    DC home Comfort measures reviewed  Bleeding precautions RX: vicodin PRN #20, phenergan PRN #30  Return to MAU as needed FU with OB as planned  Follow-up Information    [facility] .   Specialty:  Obstetrics and Gynecology Contact information: [address] [place] [address] [phone]           Tawnya Crook 02/28/2016, 2:41 AM

## 2016-02-28 NOTE — MAU Note (Signed)
Patient presents to mau with c/o increased abdominal pain. States was seen in the office earlier in the week and was told she was having a "miscarriage". Was given cytotec on Wednesday in the office (400mcg) and endorses being told to take 800mg  ibuprofen for pain. Here today with increasing pain not relieved by ibuprofen; last took a dose at 830 pm.  States is currently still bleeding and passing small dime sized clots.

## 2016-03-10 DIAGNOSIS — O039 Complete or unspecified spontaneous abortion without complication: Secondary | ICD-10-CM | POA: Diagnosis not present

## 2016-03-16 DIAGNOSIS — O039 Complete or unspecified spontaneous abortion without complication: Secondary | ICD-10-CM | POA: Diagnosis not present

## 2016-03-24 ENCOUNTER — Ambulatory Visit (HOSPITAL_COMMUNITY)
Admission: RE | Admit: 2016-03-24 | Discharge: 2016-03-24 | Disposition: A | Discharge status: Home / Self Care | Payer: 59 | Source: Ambulatory Visit | Attending: Obstetrics and Gynecology | Admitting: Obstetrics and Gynecology

## 2016-03-24 DIAGNOSIS — Z8279 Family history of other congenital malformations, deformations and chromosomal abnormalities: Secondary | ICD-10-CM | POA: Diagnosis not present

## 2016-03-24 DIAGNOSIS — Z1589 Genetic susceptibility to other disease: Secondary | ICD-10-CM

## 2016-03-24 DIAGNOSIS — E7212 Methylenetetrahydrofolate reductase deficiency: Secondary | ICD-10-CM | POA: Diagnosis not present

## 2016-03-24 DIAGNOSIS — O021 Missed abortion: Secondary | ICD-10-CM | POA: Diagnosis not present

## 2016-03-25 ENCOUNTER — Encounter (HOSPITAL_COMMUNITY): Payer: Self-pay | Admitting: MS"

## 2016-03-25 DIAGNOSIS — Z8279 Family history of other congenital malformations, deformations and chromosomal abnormalities: Secondary | ICD-10-CM | POA: Insufficient documentation

## 2016-03-25 NOTE — Progress Notes (Signed)
Preconception Genetic Counseling   Appointment Date:  03/25/2016 Referred By: Crawford Givens, MD Date of Birth:  04-Sep-1985   Pregnancy History: G1P0010  Attending: Benjaman Lobe, MD  I met with Kristina Stewart and her husband for genetic counseling because of a family history of Fragile X syndrome and the patient's personal history of MTHFR variants.  In summary:  Discussed family history of Fragile X syndrome  Patient reported she and her mother are premutation carriers; patient's sister has full mutation and is symptomatic  Reviewed X-linked inheritance and 50% risk for offspring to inherit X chromosome with changed FMR1 allele  Reviewed that FMR1 premutations can either remain stable or can expand to full mutations when inherited  Reviewed option to repeat FMR1 testing for patient given that records are not available to confirm the presence and size of her mutation; patient declined today  Discussed options for prenatal diagnosis in a future pregnancy, if desired  Patient is homozygous for MTHFR variant C667T   Reviewed that recent literature indicates this it not significantly associated with thrombophilia and not associated with increased risk for spontaneous abortion  Current [facility] practice guidelines indicate that MTHFR genotyping no longer be included as part of thrombophilia or recurrent pregnancy loss evaluations  Reviewed recommendation that all women of reproductive age take daily folic acid supplementation to lower risk for neural tube defects in pregnancy  Patient reported history of Chiari I malformation  Discussed general population carrier screening options- declined today  CF  SMA  Hemoglobinopathies  We began by reviewing the family history in detail. The couple reported one pregnancy together, which recently resulted in miscarriage in the first trimester. Mrs. Michalsky is homozygous for MTHFR variant C677T. She reported that this was detected  from a previous thrombophilia workup in [place] in 2013 prior to being prescribed oral contraceptives. She has not history of thrombolytic events. Mrs. Lemanski had homocysteine levels evaluated in 2013, which were within normal range. She has been evaluated by Dr. Beryle Beams in hematology/oncology given this history.    The MTHFR gene makes an enzyme whose activity is involved in helping convert amino acids (the building blocks of proteins) homocysteine to methionine in the body. The MTHFR enzyme is involved in making a form of folate, which is important in regulating the level of homocysteine in the body. Certain gene changes in MTHFR have been identified that cause the gene not to work properly, causing less of the enzyme to be made. This can result in an increased amount of homocysteine in the blood and urine (hyperhomocysteinemia and homocysteinuria) and also possibly low serum folate levels. We discussed that recent available literature does not support an association with MTHFR variants and a significant increased risk for blood clots or spontaneous miscarriage in pregnancy. We reviewed that the gene change is quite common in the general population, with an estimated frequency of 5-14%. The effects of homozygosity for C677T are variable, and the majority of individuals are not symptomatic. Fasting total plasma homocysteine level may be obtained for patients who are homozygous for C677T variant to provide more information for appropriate counseling. Total homocysteine levels typically increase with age and are lower in the pregnant population. Current literature indicates that patients who are homozygous for C677T with normal plasma homocysteine are not considered to be at increased risk for venous thromboembolism. Thus, current available data indicate that homozygosity for MTHFR C677T variant is unlikely to have been related to the couple's miscarriage. Given the possible association with lower  folate  levels, we reviewed the recommendation for daily folic acid supplementation, which is the standard recommendation for all women of reproductive age and also reviewed options for screening and testing for open neural tube defects in future pregnancy including maternal serum AFP screening and targeted ultrasound.   Mrs. Morissette reportedly that her sister has Fragile X syndrome. Per report from the patient's mother, Mrs. Weimann and her mother carry a premutation in [place] of less than 200 repeats, and the patient's sister has >600 repeats. The patient reported that her sister is 57 years old. She reportedly began speaking at age 28 years old. She has autism, with described behavioral and sensory issues and has Attention Deficit Hyperactivity Disorder (ADHD). Mrs. Watt reported that they were all evaluated in [place] state when the patient herself was approximately 30 years old. Medical records of the laboratory results are not currently available, and the patient seemed doubtful that records would be able to be obtained regarding her previous molecular testing.   We reviewed Fragile X syndrome and X-linked inheritance given that the FMR1 gene is located on the X chromosome. In X-linked inheritance, a female passes on his X chromosome to all of his daughters and none of his sons. For a female, there is a 1 in 2 (50%) chance of passing on a particular X chromosome to each child. Thus, when a female is a carrier for an X-linked condition, having one copy of a nonworking gene on one X chromosome, there are 4 possibilities with an equal chance of each: 1) a daughter who inherits the nonworking gene and is also a carrier, 2) a daughter who inherits the working copy of the gene and is neither a carrier nor affected, 3) a son who inherits the nonworking copy of the gene and is affected, or 4) a son who inherits the working copy of the gene and is unaffected.   Fragile X syndrome is a form of inherited intellectual  disability where predominantly affected males are born to unaffected females, though there is variability in the condition and females can be symptomatic. More than 99% of individuals with Fragile X syndrome results from an expanded segment (an expansion of the CGG trinucleotide repeat) of the FMR1 gene on the X chromosome. (Less than 1% of individuals with Fragile X syndrome have a deletion of FMR1 or an intragenic FMR1 mutation.) Regarding the number of trinucleotide repeats in the FMR1 gene, less than 45 repeats of this region is considered within normal limits. People with 45-54 repeats are typically considered to be in the "intermediate" or "gray zone" range. Individuals with 55-200 repeats are considered "premutation carriers" and are at personal risk to develop certain health concerns including premature ovarian insufficiency (POI) in females and Fragile X-associated tremor/ataxia syndrome (FXTAS) in males and females. Female premutation carriers have an estimated 16% risk of developing FXTAS. We discussed that symptoms of FXTAS may include ataxia, tremor, memory loss, and peripheral neuropathy with onset after age 53 years. Features of primary ovarian insufficiency may include irregular menstrual cycles, early-onset elevated levels of FSH, early menopause, and infertility. Approximately 15-20% of women who carry a premutation in FMR1 have features of POI.   Females who are premutation carriers are at increased risk to have children affected with Fragile X syndrome, given the chance for a premutation to expand to a full mutation when transmitted to offspring. We discussed that the chances to expand to a full mutation when inherited can be somewhat estimated based on the  size of the repeats in the premutation. Available literature has shown that individuals who also have an AGG trinucleotide repeat within their FMR1 gene have a lower chance of the CGG repeat expanding when passed to offspring, given that  this is associated with increased stability in the gene.  Individuals with greater than 200 repeats have full mutations and are expected to be clinically affected, if female, and be carriers or possibly clinically affected, if female. Carriers can be asymptomatic or have symptoms that can range from mild to typical.  We discussed the option of repeat FMR1 (Fragile X) carrier testing for the patient to confirm the presence of a premutation, assess the number of CGG repeats and to assess for presence/absence of AGG interruptions in the case that a premutation range variant is detected. The patient declined this at this time, but she is aware that this is available to her at any point in the future, if desired.   We discussed the option of prenatal diagnosis via amniocentesis in a future pregnancy to assess the FMR1 alleles in the pregnancy and the number of CGG repeats within each FMR1 allele in the pregnancy. The risks, benefits, and limitations of amniocentesis were discussed. We reviewed the associated 1 in [phone] risk for complications, including spontaneous pregnancy loss. We discussed the option of targeted ultrasound and prenatal cell free DNA testing/noninvasive prenatal screening for aneuploidy in pregnancy as a screening tool for fetal sex to help modify risk assessment. However, the couple understands that prenatal ultrasound and NIPS would not be expected to assess the risk for Fragile X syndrome specifically. They understand that prenatal ultrasound does not diagnose or rule out all birth defects or genetic conditions.   Mrs. Sobiech also reported that she had surgery for Chiari I malformation in the past. A Chiari I malformation is typically isolated although it may be one feature of an underlying inherited or sporadic genetic condition.  Potential genetic contributing factors  are not well understood at this time.  Historically, isolated nonsyndromic Chiari I malformations were considered  sporadic and not inherited.  However, some studies in the medical literature now report multiple relatives in some families with Chiari I malformations.  Therefore, a Chiari I malformation may possess a stronger genetic component in some (not all) families. Since close relatives of an affected individual may have a slightly increased risk for a Chiari I malformation, radiologic studies may be offered to screen asymptomatic first-degree relatives and should be offered in symptomatic relatives. It is unlikely that an Arnold-Chiari malformation type I could be detected prenatally on a second trimester targeted ultrasound.  The family histories were otherwise found to be noncontributory for birth defects, mental retardation, and known genetic conditions. Without further information regarding the provided family history, an accurate genetic risk cannot be calculated. Further genetic counseling is warranted if more information is obtained.  Mrs. Doyne Keel Arruda was provided with written information regarding cystic fibrosis (CF), spinal muscular atrophy (SMA) and hemoglobinopathies including the carrier frequency, availability of carrier screening and prenatal diagnosis if indicated.  In addition, we discussed that CF and hemoglobinopathies are routinely screened for as part of the [place] newborn screening panel.  After further discussion, she declined screening for CF, SMA and hemoglobinopathies.  I counseled this couple regarding the above risks and available options. Most of the counseling was provided by Renford Dills, [employer] genetic counseling student, under my direct supervision. The approximate face-to-face time with the genetic counselor was 60 minutes.  Chipper Oman, MS  Certified [employer] 03/25/2016

## 2016-03-30 ENCOUNTER — Other Ambulatory Visit (HOSPITAL_COMMUNITY): Payer: Self-pay

## 2016-05-11 ENCOUNTER — Telehealth: Payer: 59 | Admitting: Family

## 2016-05-11 DIAGNOSIS — J029 Acute pharyngitis, unspecified: Secondary | ICD-10-CM

## 2016-05-11 MED ORDER — BENZONATATE 100 MG PO CAPS: 100.0000 mg | ORAL_CAPSULE | Freq: Three times a day (TID) | ORAL | 0 refills | Status: DC | PRN

## 2016-05-11 MED FILL — BENZONATATE 100 MG CAPSULE: 100 | 5 days supply | Qty: 30 | Fill #0

## 2016-05-11 NOTE — Progress Notes (Signed)
We are sorry that you are not feeling well.  Here is how we plan to help!  Based on what you have shared with me it looks like you have upper respiratory tract inflammation that has resulted in a significant cough.  Inflammation and infection in the upper respiratory tract is commonly called bronchitis and has four common causes:  Allergies, Viral Infections, Acid Reflux and Bacterial Infections.  Allergies, viruses and acid reflux are treated by controlling symptoms or eliminating the cause. An example might be a cough caused by taking certain blood pressure medications. You stop the cough by changing the medication. Another example might be a cough caused by acid reflux. Controlling the reflux helps control the cough.  Based on your presentation I believe you most likely have A cough due to a virus.  This is called viral bronchitis and is best treated by rest, plenty of fluids and control of the cough.  You may use Ibuprofen or Tylenol as directed to help your symptoms.     In addition you may use A non-prescription cough medication called Mucinex DM: take 2 tablets every 12 hours. and A prescription cough medication called Tessalon Perles 100mg. You may take 1-2 capsules every 8 hours as needed for your cough.   USE OF BRONCHODILATOR ("RESCUE") INHALERS: There is a risk from using your bronchodilator too frequently.  The risk is that over-reliance on a medication which only relaxes the muscles surrounding the breathing tubes can reduce the effectiveness of medications prescribed to reduce swelling and congestion of the tubes themselves.  Although you feel brief relief from the bronchodilator inhaler, your asthma may actually be worsening with the tubes becoming more swollen and filled with mucus.  This can delay other crucial treatments, such as oral steroid medications. If you need to use a bronchodilator inhaler daily, several times per day, you should discuss this with your provider.  There are  probably better treatments that could be used to keep your asthma under control.     HOME CARE . Only take medications as instructed by your medical team. . Complete the entire course of an antibiotic. . Drink plenty of fluids and get plenty of rest. . Avoid close contacts especially the very young and the elderly . Cover your mouth if you cough or cough into your sleeve. . Always remember to wash your hands . A steam or ultrasonic humidifier can help congestion.   GET HELP RIGHT AWAY IF: . You develop worsening fever. . You become short of breath . You cough up blood. . Your symptoms persist after you have completed your treatment plan MAKE SURE YOU   Understand these instructions.  Will watch your condition.  Will get help right away if you are not doing well or get worse.  Your e-visit answers were reviewed by a board certified advanced clinical practitioner to complete your personal care plan.  Depending on the condition, your plan could have included both over the counter or prescription medications. If there is a problem please reply  once you have received a response from your provider. Your safety is important to us.  If you have drug allergies check your prescription carefully.    You can use MyChart to ask questions about today's visit, request a non-urgent call back, or ask for a work or school excuse for 24 hours related to this e-Visit. If it has been greater than 24 hours you will need to follow up with your provider, or enter a new   e-Visit to address those concerns. You will get an e-mail in the next two days asking about your experience.  I hope that your e-visit has been valuable and will speed your recovery. Thank you for using e-visits.   

## 2016-05-27 ENCOUNTER — Telehealth: Payer: 59 | Admitting: Family

## 2016-05-27 DIAGNOSIS — J019 Acute sinusitis, unspecified: Secondary | ICD-10-CM

## 2016-05-27 MED ORDER — AMOXICILLIN-POT CLAVULANATE 875-125 MG PO TABS: 1.0000 | ORAL_TABLET | Freq: Two times a day (BID) | ORAL | 0 refills | Status: DC

## 2016-05-27 MED FILL — AMOX TR-K CLV 875-125 MG TA: 875-125 | 7 days supply | Qty: 14 | Fill #0

## 2016-05-27 NOTE — Progress Notes (Signed)

## 2016-06-29 NOTE — L&D Delivery Note (Signed)
Operative Delivery Note At 10:36 AM a viable female was delivered via Vaginal, [employer], operational(Extractor).  Presentation: vertex; Position: Occiput,, Anterior; Station: +3.  Verbal consent: obtained from patient.  Risks and benefits discussed in detail.  Risks include, but are not limited to the risks of anesthesia, bleeding, infection, damage to maternal tissues, fetal cephalhematoma.  There is also the risk of inability to effect vaginal delivery of the head, or shoulder dystocia that cannot be resolved by established maneuvers, leading to the need for emergency cesarean section.   KIWI APPLIED, TRACTION EFFORT x 2 WITH MAT PUSHING TO AFFECT EASY DEL OF VIGOROUS INFANT  APGAR: 7, 9; weight  .   Placenta status:spont>>intact , .   Cord:  with the following complications: .  Cord pH: not sent  Anesthesia:  epid Instruments: KIWI Episiotomy: None Lacerations: 2nd degree Suture Repair: 3.0 vicryl rapide Est. Blood Loss (mL): 250  Mom to postpartum.  Baby to Couplet care / Skin to Skin.  Kristina Stewart,Kristina Stewart 03/09/2017, 10:56 AM

## 2016-08-03 ENCOUNTER — Encounter (HOSPITAL_COMMUNITY): Payer: Self-pay

## 2016-08-03 DIAGNOSIS — N911 Secondary amenorrhea: Secondary | ICD-10-CM | POA: Diagnosis not present

## 2016-08-05 ENCOUNTER — Telehealth: Payer: 59 | Admitting: Nurse Practitioner

## 2016-08-05 DIAGNOSIS — J111 Influenza due to unidentified influenza virus with other respiratory manifestations: Secondary | ICD-10-CM | POA: Diagnosis not present

## 2016-08-05 MED ORDER — OSELTAMIVIR PHOSPHATE 75 MG PO CAPS: 75.0000 mg | ORAL_CAPSULE | Freq: Two times a day (BID) | ORAL | 0 refills | Status: DC

## 2016-08-05 MED FILL — OSELTAMIVIR PHOSPHATE 75 MG: 75 | 5 days supply | Qty: 10 | Fill #0

## 2016-08-05 NOTE — Progress Notes (Signed)

## 2016-08-13 DIAGNOSIS — Z3401 Encounter for supervision of normal first pregnancy, first trimester: Secondary | ICD-10-CM | POA: Diagnosis not present

## 2016-08-13 LAB — OB RESULTS CONSOLE ANTIBODY SCREEN: ANTIBODY SCREEN: NEGATIVE

## 2016-08-13 LAB — OB RESULTS CONSOLE HEPATITIS B SURFACE ANTIGEN: HEP B S AG: NEGATIVE

## 2016-08-13 LAB — OB RESULTS CONSOLE RPR: RPR: NONREACTIVE

## 2016-08-13 LAB — OB RESULTS CONSOLE HIV ANTIBODY (ROUTINE TESTING): HIV: NONREACTIVE

## 2016-08-13 LAB — OB RESULTS CONSOLE RUBELLA ANTIBODY, IGM: RUBELLA: IMMUNE

## 2016-08-14 ENCOUNTER — Encounter (HOSPITAL_COMMUNITY): Payer: Self-pay

## 2016-08-14 ENCOUNTER — Other Ambulatory Visit: Payer: Self-pay

## 2016-08-14 ENCOUNTER — Ambulatory Visit (HOSPITAL_COMMUNITY)
Admission: RE | Admit: 2016-08-14 | Discharge: 2016-08-14 | Disposition: A | Discharge status: Home / Self Care | Payer: 59 | Source: Ambulatory Visit | Attending: Obstetrics and Gynecology | Admitting: Obstetrics and Gynecology

## 2016-08-14 DIAGNOSIS — Z3A09 9 weeks gestation of pregnancy: Secondary | ICD-10-CM | POA: Insufficient documentation

## 2016-08-14 DIAGNOSIS — O352XX0 Maternal care for (suspected) hereditary disease in fetus, not applicable or unspecified: Secondary | ICD-10-CM | POA: Diagnosis not present

## 2016-08-14 DIAGNOSIS — Z87798 Personal history of other (corrected) congenital malformations: Secondary | ICD-10-CM | POA: Diagnosis not present

## 2016-08-14 DIAGNOSIS — Z9889 Other specified postprocedural states: Secondary | ICD-10-CM | POA: Diagnosis not present

## 2016-08-14 DIAGNOSIS — Z8279 Family history of other congenital malformations, deformations and chromosomal abnormalities: Secondary | ICD-10-CM | POA: Diagnosis not present

## 2016-08-14 DIAGNOSIS — Z87728 Personal history of other specified (corrected) congenital malformations of nervous system and sense organs: Secondary | ICD-10-CM | POA: Diagnosis not present

## 2016-08-14 DIAGNOSIS — O99351 Diseases of the nervous system complicating pregnancy, first trimester: Secondary | ICD-10-CM | POA: Diagnosis not present

## 2016-08-14 NOTE — Progress Notes (Signed)
Genetic Counseling  High-Risk Gestation Note  Appointment Date:  08/14/2016 Referred By: Tyson Dense, * Date of Birth:  08/13/1985 Partner:  Timoteo Gaul   Pregnancy History: G2P0010 Estimated Date of Delivery: 03/18/17 Estimated Gestational Age: 37w1dAttending: PBenjaman Lobe MD   I met with Mrs. MElmo Puttand her husband, Mr. NMirna Sutcliffe for follow-up genetic counseling because of a family history of Fragile X syndrome.  In summary:  Reviewed family history of Fragile X syndrome (see previous genetic counseling note from 03/25/16 for detailed family history)  Patient elected to pursue Fragile X carrier testing today given that medical records are not available to confirm her premutation carrier status  Discussed prenatal diagnosis available via amniocentesis for FMR1 status of pregnancy- patient declined  Patient is possibly interested in postnatal analysis for Fragile X syndrome, if warranted  Discussed general population carrier screening options - patient elected to pursue expanded carrier screening panel today ([facility])  CF- drawn today  SMA-drawn today  Hemoglobinopathies-drawn today  Ashkenazi Jewish based carrier screening- drawn today  [facility]  MFM consultation today given patient's personal history of Chiari malformation- see separate note  We began by reviewing the family history in detail. Detailed family history was obtained at previous genetic counseling visit on 03/25/16; see previous note for detailed discussion. To review, Mrs. SStockburgerreported that her sister has Fragile X syndrome. Per report from the patient's mother, Mrs. SOwczarzakand her mother carry a premutation in the FMR1 gene of less than 200 repeats, and the patient's sister has >600 repeats. [name] reported that [place] the patient herself was approximately 31years old.  Medical records of the laboratory results are not currently available, and the patient seemed doubtful that records would be able to be obtained regarding her previous molecular testing.   We reviewed Fragile X syndrome and X-linked inheritance given that the FMR1 gene is located on the X chromosome. In X-linked inheritance, a female passes on his X chromosome to all of his daughters and none of his sons. For a female, there is a 1 in 2 (50%) chance of passing on a particular X chromosome to each child. Thus, when a female is a carrier for an X-linked condition, having one copy of a nonworking gene on one X chromosome, there are 4 possibilities with an equal chance of each: 1) a daughter who inherits the nonworking gene and is also a carrier, 2) a daughter who inherits the working copy of the gene and is neither a carrier nor affected, 3) a son who inherits the nonworking copy of the gene and is affected, or 4) a son who inherits the working copy of the gene and is unaffected.   Fragile X syndrome is a form of inherited intellectual disability where predominantly affected males are born to unaffected females, though there is variability in the condition and females can be symptomatic. More than 99% of individuals with Fragile X syndrome results from an expanded segment (an expansion of the CGG trinucleotide repeat) of the FMR1 gene on the X chromosome. (Less than 1% of individuals with Fragile X syndrome have a deletion of FMR1 or an intragenic FMR1 mutation.) Regarding the number of trinucleotide repeats in the FMR1 gene, less than 45 repeats of this region is considered within normal limits. People with 45-54 repeats are typically considered to be in the "intermediate" or "gray zone" range. Individuals with  55-200 repeats are considered "premutation carriers" and are at personal risk to develop certain health concerns including premature ovarian insufficiency (POI) in females and Fragile X-associated  tremor/ataxia syndrome (FXTAS) in males and females. Female premutation carriers have an estimated 16% risk of developing FXTAS. We discussed that symptoms of FXTAS may include ataxia, tremor, memory loss, and peripheral neuropathy with onset after age 22 years. Features of primary ovarian insufficiency may include irregular menstrual cycles, early-onset elevated levels of FSH, early menopause, and infertility. Approximately 15-20% of women who carry a premutation in FMR1 have features of POI.   Females who are premutation carriers are at increased risk to have children affected with Fragile X syndrome, given the chance for a premutation to expand to a full mutation when transmitted to offspring. We discussed that the chances to expand to a full mutation when inherited can be somewhat estimated based on the size of the repeats in the premutation. Available literature has shown that individuals who also have an AGG trinucleotide repeat within their FMR1 gene have a lower chance of the CGG repeat expanding when passed to offspring, given that this is associated with increased stability in the gene.  Individuals with greater than 200 repeats have full mutations and are expected to be clinically affected, if female, and be carriers or possibly clinically affected, if female. Carriers can be asymptomatic or have symptoms that can range from mild to typical.  We discussed the option of repeat FMR1 (Fragile X) carrier testing for the patient to confirm the presence of a premutation, assess the number of CGG repeats and to assess for presence/absence of AGG interruptions in the case that a premutation range variant is detected. [name] elected to pursue repeat Fragile X carrier screening today through [facility] laboratory.   We discussed the option of prenatal diagnosis via amniocentesis to assess the FMR1 alleles in the pregnancy and the number of CGG repeats within each FMR1 allele in the pregnancy. The risks,  benefits, and limitations of amniocentesis were discussed. We reviewed the associated 1 in [phone] risk for complications, including spontaneous pregnancy loss. They understand that prenatal ultrasound does not diagnose or rule out all birth defects or genetic conditions. Mrs. Coco A Riso declined invasive prenatal diagnosis for FMR1 status given the associated risk for complications.   The family histories were otherwise found to be noncontributory for updates. Without further information regarding the provided family history, an accurate genetic risk cannot be calculated. Further genetic counseling is warranted if more information is obtained.  We reviewed available screening and diagnostic options.  Regarding screening tests, we discussed the options of First screen, Quad screen and ultrasound.  They understand that screening tests are used to modify a patient's a priori risk for aneuploidy, typically based on age.  This estimate provides a pregnancy specific risk assessment. Mrs. Rohman has First trimester screening planned through her OB office in March.   Mrs. Doyne Keel Handler was provided with written information regarding cystic fibrosis (CF), spinal muscular atrophy (SMA) and hemoglobinopathies including the carrier frequency, availability of carrier screening and prenatal diagnosis if indicated.  In addition, we discussed that CF and hemoglobinopathies are routinely screened for as part of the [place] newborn screening panel.  After further discussion, she elected to pursue screening for CF, SMA and hemoglobinopathies today through [facility] laboratory.  In addition, they were counseled that there are a variety of genetic screening laboratories that have pan-ethnic, or expanded, carrier screening panels, which evaluate carrier status for a  wide range of genetic conditions. Some of these conditions are severe and actionable, but also rare; others occur more commonly, but are less severe. We  discussed that testing options range from screening for a single condition to panels of more than 200 autosomal or X-linked genetic conditions. We reviewed that the prevalence of each condition varies (and often varies with ethnicity). Thus the couples' background risk to be a carrier for each of these various conditions would range, and in some cases be very low or unknown. Similarly, the detection rate varies with each condition and also varies in some cases with ethnicity, ranging from greater than 99% (in the case of hemoglobinopathies) to unknown. We reviewed that a negative carrier screen would thus reduce, but not eliminate the chance to be a carrier for these conditions. For some conditions included on specific pan-ethnic carrier screening panels, the pre-test carrier frequency and/or the detection rate is unknown. Thus, for some conditions, the exact reduction of risk with a negative carrier screening result may not be able to be quantified. We reviewed that in the event that one partner is found to be a carrier for one or more conditions, carrier screening would be available to the partner for those conditions. The couple was provided with written information regarding pan-ethnic carrier screening option. We discussed the risks, benefits, and limitations of carrier screening with the couple. After thoughtful consideration of their options, Mrs. SHEREA LIPTAK elected to have the expanded carrier screening panel through [facility] laboratory (including ACOG recommended panel). This panel screens for carrier status of 175 hereditary disorders. We discussed that results will be available in approximately 2 weeks.   Mrs. Jacqulyne A Scollard denied exposure to environmental toxins or chemical agents. She denied the use of alcohol, tobacco or street drugs. She denied significant viral illnesses during the course of her pregnancy. Her medical and surgical histories were contributory for history of Chiari I  malformation. See separate MFM consult note from today's visit for detailed discussion regarding this history.    I counseled this couple regarding the above risks and available options.  The approximate face-to-face time with the genetic counselor was 25 minutes.  Chipper Oman, MS Certified Genetic Counselor 08/14/2016

## 2016-08-14 NOTE — Consult Note (Signed)
Maternal Fetal Medicine Consultation  Requesting Provider(s): Belva Agee, MD  Reason for consultation: History of Chiari I malformation s/p repair - recommendations for delivery  HPI: Kristina Stewart is a 31 yo G2P0010, EDD 03/18/2017 who is currently at 9w 1d seen for consultation and delivery recommendations due to a personal history of Chiari I malformation s/p craniotomy and laminectomy.  The patient underwent surgical repair at age 8 by a pediatric neurosurgeon in [place], [place].  Since her surgical repair, she has not had any chronic issues.  She reports that the procedure was "outside the dura".  Ms. Kham has a family member with Fragile X and has a premutation for Fragile X (see separate consult note from Dentist) and has an MTFHR variant.  She is without complaints today.  Her prenatal course thus far has otherwise been uncomplicated.  OB History: OB History    Gravida Para Term Preterm AB Living   2       1 0   SAB TAB Ectopic Multiple Live Births   1              PMH:  Past Medical History:  Diagnosis Date  . Allergy   . Asthma   . IBS (irritable bowel syndrome)   . Obesity   . Seasonal allergic rhinitis   . Strep pharyngitis   . TMJ (temporomandibular joint syndrome)     PSH:  Past Surgical History:  Procedure Laterality Date  . BRAIN SURGERY     craniotomy/arnold chiari malformation  . BRAIN SURGERY     craniotomy/arnold chiari malformation   Meds:  Current Outpatient Prescriptions on File Prior to Encounter  Medication Sig Dispense Refill  . albuterol (PROVENTIL) (2.5 MG/3ML) 0.083% nebulizer solution Take 2.5 mg by nebulization every 6 (six) hours as needed.    Marland Kitchen amoxicillin-clavulanate (AUGMENTIN) 875-125 MG tablet Take 1 tablet by mouth 2 (two) times daily. (Patient not taking: Reported on 08/14/2016) 14 tablet 0  . benzonatate (TESSALON PERLES) 100 MG capsule Take 1-2 capsules (100-200 mg total) by mouth every 8 (eight) hours as  needed. (Patient not taking: Reported on 08/14/2016) 30 capsule 0  . Fluticasone-Salmeterol (ADVAIR DISKUS) 250-50 MCG/DOSE AEPB Inhale 1 puff into the lungs every 12 (twelve) hours. (Patient not taking: Reported on 08/14/2016) 1 each 5  . HYDROcodone-acetaminophen (NORCO/VICODIN) 5-325 MG tablet Take 1-2 tablets by mouth every 4 (four) hours as needed. (Patient not taking: Reported on 08/14/2016) 20 tablet 0  . montelukast (SINGULAIR) 10 MG tablet Take 1 tablet (10 mg total) by mouth at bedtime. (Patient not taking: Reported on 08/14/2016) 30 tablet 5  . oseltamivir (TAMIFLU) 75 MG capsule Take 1 capsule (75 mg total) by mouth 2 (two) times daily. (Patient not taking: Reported on 08/14/2016) 10 capsule 0  . promethazine (PHENERGAN) 25 MG tablet Take 0.5-1 tablets (12.5-25 mg total) by mouth every 6 (six) hours as needed. (Patient not taking: Reported on 08/14/2016) 30 tablet 0   No current facility-administered medications on file prior to encounter.    Allergies:  Allergies  Allergen Reactions  . Latex Rash  . Latex   . Other     PT IS ALLERGIC PITTED FRUITS AND ALMONDS   FH:  Family History  Problem Relation Age of Onset  . Hypertension Mother   . Thyroid disease Mother   . Hypertension Father   . Irritable bowel syndrome Father   . GER disease Sister   . Fragile X syndrome Sister   .  Alzheimer's disease Paternal Grandfather   . Diabetes Paternal Grandmother   . Fibromyalgia Paternal Grandmother   . Thyroid disease Maternal Grandmother     HYPOTHYROID  . Hypertension Maternal Grandmother   . Cancer Maternal Grandmother     LUNG  . Cancer Maternal Grandfather     LUNG   Soc:  Social History   Social History  . Marital status: Married    Spouse name: N/A  . Number of children: N/A  . Years of education: N/A   Occupational History  . Not on file.   Social History Main Topics  . Smoking status: Never Smoker  . Smokeless tobacco: Never Used  . Alcohol use No  . Drug use:  No  . Sexual activity: Yes    Birth control/ protection: None   Other Topics Concern  . Not on file   Social History Narrative   ** Merged History Encounter **        Review of Systems: no vaginal bleeding or cramping/contractions, no LOF, no nausea/vomiting. All other systems reviewed and are negative.  PE:   Vitals:   08/14/16 1359  BP: 118/76  Pulse: 99     A/P: 1) Single IUP at 9w 1d  2) Fragile X premutation - see separate note from [employer] counselor  3) MTFHR mutation - this is not associated with untoward pregnancy outcomes and is generally not recommended as part of the thrombophilia work up.   4) Hx of Chiari I malformation s/p Craniotomy / Laminectomy - there is no consensus in the literature on the appropriate route of delivery in women with Chiari I malformation.  There are reports in the medical literature of primary Cesarean delivery for this indication as well as vaginal delivery and vaginal delivery with shortened second stage (vacuum or forceps).  I would make the following recommendations:  formal neurology or neurosurgical consultation - if they feel the risk of herniation with valsalva is high enough, would recommend a primary C-section likely under general anesthesia.  If the risk is felt to be minimal with valsalva, would allow a vaginal delivery.  Alternatively, may offer a shortened second stage with planned forceps or vacuum extraction.  Formal Anesthesia consult - my suspicion is that Anesthesia would have concerns for regional anesthesia given this history and feel that it would be reasonable to have a plan in place for plain control in labor well ahead of time.   Thank you for the opportunity to be a part of the care of Gustava A Stretch. Please contact our office if we can be of further assistance.   I spent approximately 30 minutes with this patient with over 50% of time spent in face-to-face counseling.  Alpha GulaPaul Daric Koren, MD Maternal Fetal  Medicine

## 2016-08-20 DIAGNOSIS — Z3401 Encounter for supervision of normal first pregnancy, first trimester: Secondary | ICD-10-CM | POA: Diagnosis not present

## 2016-08-20 DIAGNOSIS — Z36 Encounter for antenatal screening for chromosomal anomalies: Secondary | ICD-10-CM | POA: Diagnosis not present

## 2016-08-20 DIAGNOSIS — Z3A10 10 weeks gestation of pregnancy: Secondary | ICD-10-CM | POA: Diagnosis not present

## 2016-08-20 DIAGNOSIS — O26891 Other specified pregnancy related conditions, first trimester: Secondary | ICD-10-CM | POA: Diagnosis not present

## 2016-08-27 DIAGNOSIS — Z1371 Encounter for nonprocreative screening for genetic disease carrier status: Secondary | ICD-10-CM | POA: Diagnosis not present

## 2016-08-27 DIAGNOSIS — Z31430 Encounter of female for testing for genetic disease carrier status for procreative management: Secondary | ICD-10-CM | POA: Diagnosis not present

## 2016-08-27 DIAGNOSIS — Z3490 Encounter for supervision of normal pregnancy, unspecified, unspecified trimester: Secondary | ICD-10-CM | POA: Diagnosis not present

## 2016-08-27 DIAGNOSIS — Z8489 Family history of other specified conditions: Secondary | ICD-10-CM | POA: Diagnosis not present

## 2016-08-31 ENCOUNTER — Telehealth (HOSPITAL_COMMUNITY): Payer: Self-pay | Admitting: MS"

## 2016-08-31 NOTE — Telephone Encounter (Signed)
Called Kristina Stewart to discuss her carrier screening results. Kristina Stewart had carrier screening for Fragile X to confirm the repeat number of the premutation she carries as well as expanded carrier screening panel through [facility]. The carrier screening panel included the ACOG recommended conditions (SMA, CF, hemoglobinopathies, and Ashkenazi Jewish based conditions) and included 175 autosomal recessive and X-linked conditions. The patient was identified by name and DOB.   We discussed that Fragile X carrier screening confirmed that she carries a premutation. However, she was determined to be mosaic for two different premutation sizes (107 CGG repeats and 112 CGG repeats); she also was found to have an allele with repeats in the normal range (30 CGG repeats). We discussed that this is most likely explained by her having the normal allele in all cells and then being mosaic for some cells with one premutation size and some cells with the other. We discussed that based on this result, another less likely explanation would be underlying triple X (trisomy X) for the patient, and we discussed the option of peripheral blood chromosome analysis for her to rule this out. Kristina Stewart declined peripheral blood chromosome analysis at this time. We discussed that given that the premutation alleles are >100 repeats, if this allele is passed on to offspring, it is likely to expand to a full mutation. In summary, the risk for FMR1 expanded allele to be inherited in the current and future offspring is expected to be 50%, and the expanded FMR1 allele is likely to be full mutation range rather than premutation range. We again reviewed the option of prenatal diagnosis via amniocentesis, and Kristina Stewart again stated that she and her husband are not likely interested in pursuing prenatal diagnosis for Fragile X syndrome.   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 This particular variant has been associated with partial biotinidase deficiency. We reviewed the autosomal recessive inheritance of this condition. CDG type 1a is characterized by impaired production of glycoproteins, and specifically type 1a is due to a defect in the enzyme phophomannomutase. We discussed that CDG-Ia affects many body systems, particularly the nervous system. Characteristics include developmental delay, seizures, stroke-like episodes, and impaired ability to move physically and coordinate movement. Approximately 20% of affected infants die within the first year of life, and long-term effects typically include use of wheelchair. We discussed that certain physical characteristics may be apparent at birth for affected individuals with CDG-Ia, including inverted nipples, hypotonia, almond shaped eyes, a large forehead, unusual distribution of body fat, and abnormal genitalia. Currently, no cure is available for CDG-Ia, but treatment is aimed at ensuring appropriate nutrition, early interventional therapies, medications for certain symptoms, and wheelchairs and other movement aids.   Prevalence of CDG-Ia is estimated to be 1 in 137,000 for profound biotinidase deficiency and 1 in 50,000 to 1 in 100,000 births. General population carrier frequency is approximately 1 in 160. Thus, prior to carrier screening for the patient's partner, risk for biotinidase deficiency in the current pregnancy is estimated to be 1 in 640. Approximately >99% of carriers will be identified by DNA sequencing of the PMM2 gene.   We discussed the option of carrier screening for her partner. There are different options available for carrier screening through [facility]. We discussed that he could be tested for only CDG-Ia  to establish a  reproductive risk for their offspring together. Alternatively, he could have carrier testing for the entire panel. As we already know she is not a carrier for those other conditions on the panel, if he were to find out he were a carrier of a different condition the chance for that condition in an offspring would remain small. However, each offspring would have a 50% chance to be a carrier for any condition that either parent carriers. Thus, it may be information that he would like to have in future, or for his extended family. Kristina Stewart stated that Kristina Stewart will likely plan to pursue carrier screening for CDG-Ia, and she plans to initiate this process through the lab ([facility]) patient access portal. We will follow-up on carrier screening for Kristina Stewart once this has resulted and contact the patient with a revised risk assessment for the pregnancy.    We discussed that her results for the remaining 174 conditions are negative, indicating that she does not have a detectable gene alteration in any of the genes for which analysis was performed. We reviewed that carrier screening does not detect all carriers of these conditions, but a normal result significantly decreases the likelihood of being a carrier, and therefore, the overall reproductive risk. We reviewed that [facility] sequences most of the genes, which is associated with a high detection rate for carriers, thus a negative screen is very reassuring. All questions were answered to her satisfaction, she was encouraged to call with additional questions or concerns. ? Kristina PlowmanKaren Siriyah Ambrosius, MS Patent attorneyCertified Genetic Counselor

## 2016-09-07 DIAGNOSIS — Z3491 Encounter for supervision of normal pregnancy, unspecified, first trimester: Secondary | ICD-10-CM | POA: Diagnosis not present

## 2016-09-07 DIAGNOSIS — Z36 Encounter for antenatal screening for chromosomal anomalies: Secondary | ICD-10-CM | POA: Diagnosis not present

## 2016-09-07 DIAGNOSIS — Z3683 Encounter for fetal screening for congenital cardiac abnormalities: Secondary | ICD-10-CM | POA: Diagnosis not present

## 2016-09-14 DIAGNOSIS — G935 Compression of brain: Secondary | ICD-10-CM | POA: Diagnosis not present

## 2016-09-15 ENCOUNTER — Other Ambulatory Visit (HOSPITAL_COMMUNITY): Payer: Self-pay | Admitting: Neurosurgery

## 2016-09-15 DIAGNOSIS — G935 Compression of brain: Secondary | ICD-10-CM

## 2016-09-17 ENCOUNTER — Other Ambulatory Visit (HOSPITAL_COMMUNITY): Payer: Self-pay | Admitting: Neurosurgery

## 2016-09-17 DIAGNOSIS — G935 Compression of brain: Secondary | ICD-10-CM

## 2016-09-28 ENCOUNTER — Encounter (HOSPITAL_COMMUNITY): Payer: Self-pay

## 2016-09-28 ENCOUNTER — Ambulatory Visit (HOSPITAL_COMMUNITY)
Admission: RE | Admit: 2016-09-28 | Discharge: 2016-09-28 | Disposition: A | Discharge status: Home / Self Care | Payer: 59 | Source: Ambulatory Visit | Attending: Neurosurgery | Admitting: Neurosurgery

## 2016-10-13 DIAGNOSIS — Z3A17 17 weeks gestation of pregnancy: Secondary | ICD-10-CM | POA: Diagnosis not present

## 2016-10-13 DIAGNOSIS — Z3400 Encounter for supervision of normal first pregnancy, unspecified trimester: Secondary | ICD-10-CM | POA: Diagnosis not present

## 2016-10-13 DIAGNOSIS — Z363 Encounter for antenatal screening for malformations: Secondary | ICD-10-CM | POA: Diagnosis not present

## 2016-10-15 ENCOUNTER — Telehealth (HOSPITAL_COMMUNITY): Payer: Self-pay | Admitting: MS"

## 2016-10-15 NOTE — Telephone Encounter (Signed)
Called Ms. Lawrence Marseilles Demello regarding results of carrier screening for husband, Mr. Isaiah Torok for Congenital Disorders of Glycosylation type Ia. Patient and husband identified by name and DOB. Mr. Coote pursued carrier screening through [facility] laboratory, given that Mrs. Troyer was identified to be a carrier for this condition. Discussed that Mr. Rutten carrier screening was negative. Gene sequencing for CDG-1a detects greater than 99% of carriers. Thus, Mr. Rengel's risk to be a carrier for CDG-1a has been reduced to 1 in 16,000. We discussed that this means that the reproductive risk for CDG-1a for the couple has been reduced to approximately 1 in 63,000. All questions were answered to her satisfaction at this time. The patient was encouraged to call back with additional questions or concerns.   Clydie Braun Jaymes Hang  10/15/2016  12:08 PM

## 2016-10-29 DIAGNOSIS — Z362 Encounter for other antenatal screening follow-up: Secondary | ICD-10-CM | POA: Diagnosis not present

## 2016-10-29 DIAGNOSIS — Z3400 Encounter for supervision of normal first pregnancy, unspecified trimester: Secondary | ICD-10-CM | POA: Diagnosis not present

## 2016-11-26 DIAGNOSIS — Z3480 Encounter for supervision of other normal pregnancy, unspecified trimester: Secondary | ICD-10-CM | POA: Diagnosis not present

## 2016-11-26 DIAGNOSIS — Z3400 Encounter for supervision of normal first pregnancy, unspecified trimester: Secondary | ICD-10-CM | POA: Diagnosis not present

## 2016-11-26 DIAGNOSIS — Z3A24 24 weeks gestation of pregnancy: Secondary | ICD-10-CM | POA: Diagnosis not present

## 2016-11-26 DIAGNOSIS — Z362 Encounter for other antenatal screening follow-up: Secondary | ICD-10-CM | POA: Diagnosis not present

## 2016-11-27 DIAGNOSIS — Z8279 Family history of other congenital malformations, deformations and chromosomal abnormalities: Secondary | ICD-10-CM | POA: Diagnosis not present

## 2016-12-12 ENCOUNTER — Inpatient Hospital Stay (HOSPITAL_COMMUNITY): Admission: AD | Admit: 2016-12-12 | Payer: 59 | Source: Ambulatory Visit | Admitting: Obstetrics and Gynecology

## 2016-12-17 DIAGNOSIS — D509 Iron deficiency anemia, unspecified: Secondary | ICD-10-CM | POA: Diagnosis not present

## 2016-12-17 DIAGNOSIS — Z3400 Encounter for supervision of normal first pregnancy, unspecified trimester: Secondary | ICD-10-CM | POA: Diagnosis not present

## 2016-12-24 ENCOUNTER — Ambulatory Visit (HOSPITAL_COMMUNITY)
Admission: RE | Admit: 2016-12-24 | Discharge: 2016-12-24 | Disposition: A | Discharge status: Home / Self Care | Payer: 59 | Source: Ambulatory Visit | Attending: Neurosurgery | Admitting: Neurosurgery

## 2016-12-24 DIAGNOSIS — Z9889 Other specified postprocedural states: Secondary | ICD-10-CM | POA: Insufficient documentation

## 2016-12-24 DIAGNOSIS — G935 Compression of brain: Secondary | ICD-10-CM | POA: Insufficient documentation

## 2016-12-24 DIAGNOSIS — M50221 Other cervical disc displacement at C4-C5 level: Secondary | ICD-10-CM | POA: Diagnosis not present

## 2016-12-28 DIAGNOSIS — Z3480 Encounter for supervision of other normal pregnancy, unspecified trimester: Secondary | ICD-10-CM | POA: Diagnosis not present

## 2016-12-28 DIAGNOSIS — Z3400 Encounter for supervision of normal first pregnancy, unspecified trimester: Secondary | ICD-10-CM | POA: Diagnosis not present

## 2016-12-28 DIAGNOSIS — Z23 Encounter for immunization: Secondary | ICD-10-CM | POA: Diagnosis not present

## 2016-12-28 MED FILL — VITAFOL FE+ DOCUSATE COMBO: 90-1-200 & | 30 days supply | Qty: 60 | Fill #0

## 2017-01-25 DIAGNOSIS — Z6841 Body Mass Index (BMI) 40.0 and over, adult: Secondary | ICD-10-CM | POA: Diagnosis not present

## 2017-01-25 DIAGNOSIS — G935 Compression of brain: Secondary | ICD-10-CM | POA: Diagnosis not present

## 2017-01-25 DIAGNOSIS — R030 Elevated blood-pressure reading, without diagnosis of hypertension: Secondary | ICD-10-CM | POA: Diagnosis not present

## 2017-02-12 DIAGNOSIS — Z3685 Encounter for antenatal screening for Streptococcus B: Secondary | ICD-10-CM | POA: Diagnosis not present

## 2017-02-17 MED FILL — VITAFOL FE+ DOCUSATE COMBO: 90-1-200 & | 30 days supply | Qty: 60 | Fill #1

## 2017-03-08 ENCOUNTER — Inpatient Hospital Stay (HOSPITAL_COMMUNITY): Payer: 59 | Admitting: Anesthesiology

## 2017-03-08 ENCOUNTER — Inpatient Hospital Stay (HOSPITAL_COMMUNITY)
Admission: AD | Admit: 2017-03-08 | Discharge: 2017-03-11 | DRG: 775 | Disposition: A | Discharge status: Home / Self Care | Payer: 59 | Source: Ambulatory Visit | Attending: Obstetrics & Gynecology | Admitting: Obstetrics & Gynecology

## 2017-03-08 ENCOUNTER — Encounter (HOSPITAL_COMMUNITY): Payer: Self-pay | Admitting: *Deleted

## 2017-03-08 DIAGNOSIS — O99214 Obesity complicating childbirth: Secondary | ICD-10-CM | POA: Diagnosis present

## 2017-03-08 DIAGNOSIS — Z6841 Body Mass Index (BMI) 40.0 and over, adult: Secondary | ICD-10-CM

## 2017-03-08 DIAGNOSIS — Z91018 Allergy to other foods: Secondary | ICD-10-CM

## 2017-03-08 DIAGNOSIS — O4292 Full-term premature rupture of membranes, unspecified as to length of time between rupture and onset of labor: Principal | ICD-10-CM | POA: Diagnosis present

## 2017-03-08 DIAGNOSIS — K219 Gastro-esophageal reflux disease without esophagitis: Secondary | ICD-10-CM | POA: Diagnosis not present

## 2017-03-08 DIAGNOSIS — Z91040 Latex allergy status: Secondary | ICD-10-CM | POA: Diagnosis not present

## 2017-03-08 DIAGNOSIS — O99354 Diseases of the nervous system complicating childbirth: Secondary | ICD-10-CM | POA: Diagnosis present

## 2017-03-08 DIAGNOSIS — O9962 Diseases of the digestive system complicating childbirth: Secondary | ICD-10-CM | POA: Diagnosis not present

## 2017-03-08 DIAGNOSIS — Z3A38 38 weeks gestation of pregnancy: Secondary | ICD-10-CM | POA: Diagnosis not present

## 2017-03-08 DIAGNOSIS — O4290 Premature rupture of membranes, unspecified as to length of time between rupture and onset of labor, unspecified weeks of gestation: Secondary | ICD-10-CM | POA: Diagnosis present

## 2017-03-08 LAB — TYPE AND SCREEN
ABO/RH(D): O POS
ANTIBODY SCREEN: NEGATIVE

## 2017-03-08 LAB — CBC
HCT: 36.6 % (ref 36.0–46.0)
HEMOGLOBIN: 12.4 g/dL (ref 12.0–15.0)
MCH: 27.6 pg (ref 26.0–34.0)
MCHC: 33.9 g/dL (ref 30.0–36.0)
MCV: 81.3 fL (ref 78.0–100.0)
Platelets: 277 10*3/uL (ref 150–400)
RBC: 4.5 MIL/uL (ref 3.87–5.11)
RDW: 15.3 % (ref 11.5–15.5)
WBC: 14.8 10*3/uL — AB (ref 4.0–10.5)

## 2017-03-08 LAB — OB RESULTS CONSOLE GBS: STREP GROUP B AG: NEGATIVE

## 2017-03-08 LAB — POCT FERN TEST: POCT Fern Test: POSITIVE

## 2017-03-08 MED ORDER — FENTANYL CITRATE (PF) 100 MCG/2ML IJ SOLN: 50.0000 ug | INTRAMUSCULAR | Status: DC | PRN

## 2017-03-08 MED ORDER — LACTATED RINGERS IV SOLN: 500.0000 mL | INTRAVENOUS | Status: DC | PRN

## 2017-03-08 MED ORDER — FLEET ENEMA 7-19 GM/118ML RE ENEM: 1.0000 | ENEMA | RECTAL | Status: DC | PRN

## 2017-03-08 MED ORDER — OXYTOCIN 40 UNITS IN LACTATED RINGERS INFUSION - SIMPLE MED
1.0000 m[IU]/min | INTRAVENOUS | Status: DC
  Administered 2017-03-08: 2 m[IU]/min via INTRAVENOUS
  Filled 2017-03-08: qty 1000

## 2017-03-08 MED ORDER — EPHEDRINE 5 MG/ML INJ
10.0000 mg | INTRAVENOUS | Status: DC | PRN
Start: 2017-03-08 — End: 2017-03-09
  Filled 2017-03-08: qty 2

## 2017-03-08 MED ORDER — ACETAMINOPHEN 325 MG PO TABS: 650.0000 mg | ORAL_TABLET | ORAL | Status: DC | PRN

## 2017-03-08 MED ORDER — SOD CITRATE-CITRIC ACID 500-334 MG/5ML PO SOLN
30.0000 mL | ORAL | Status: DC | PRN
Start: 2017-03-08 — End: 2017-03-09

## 2017-03-08 MED ORDER — LACTATED RINGERS IV SOLN
500.0000 mL | Freq: Once | INTRAVENOUS | Status: AC
  Administered 2017-03-08: 500 mL via INTRAVENOUS

## 2017-03-08 MED ORDER — EPHEDRINE 5 MG/ML INJ
10.0000 mg | INTRAVENOUS | Status: AC | PRN
  Administered 2017-03-08 – 2017-03-09 (×2): 10 mg via INTRAVENOUS

## 2017-03-08 MED ORDER — OXYCODONE-ACETAMINOPHEN 5-325 MG PO TABS: 1.0000 | ORAL_TABLET | ORAL | Status: DC | PRN

## 2017-03-08 MED ORDER — FENTANYL 2.5 MCG/ML BUPIVACAINE 1/10 % EPIDURAL INFUSION (WH - ANES)
14.0000 mL/h | INTRAMUSCULAR | Status: DC | PRN
  Administered 2017-03-08 – 2017-03-09 (×2): 14 mL/h via EPIDURAL
  Filled 2017-03-08 (×2): qty 100

## 2017-03-08 MED ORDER — LIDOCAINE HCL (PF) 1 % IJ SOLN
30.0000 mL | INTRAMUSCULAR | Status: DC | PRN
  Filled 2017-03-08: qty 30

## 2017-03-08 MED ORDER — DIPHENHYDRAMINE HCL 50 MG/ML IJ SOLN: 12.5000 mg | INTRAMUSCULAR | Status: DC | PRN

## 2017-03-08 MED ORDER — OXYTOCIN 40 UNITS IN LACTATED RINGERS INFUSION - SIMPLE MED
2.5000 [IU]/h | INTRAVENOUS | Status: DC
  Administered 2017-03-09: 2.5 [IU]/h via INTRAVENOUS

## 2017-03-08 MED ORDER — TERBUTALINE SULFATE 1 MG/ML IJ SOLN
0.2500 mg | Freq: Once | INTRAMUSCULAR | Status: DC | PRN
  Filled 2017-03-08: qty 1

## 2017-03-08 MED ORDER — ONDANSETRON HCL 4 MG/2ML IJ SOLN
4.0000 mg | Freq: Four times a day (QID) | INTRAMUSCULAR | Status: DC | PRN
  Administered 2017-03-09: 4 mg via INTRAVENOUS
  Filled 2017-03-08: qty 2

## 2017-03-08 MED ORDER — LACTATED RINGERS IV SOLN
INTRAVENOUS | Status: DC
  Administered 2017-03-08: 19:00:00 via INTRAVENOUS
  Administered 2017-03-08: 125 mL/h via INTRAVENOUS

## 2017-03-08 MED ORDER — PHENYLEPHRINE 40 MCG/ML (10ML) SYRINGE FOR IV PUSH (FOR BLOOD PRESSURE SUPPORT)
80.0000 ug | PREFILLED_SYRINGE | INTRAVENOUS | Status: DC | PRN
  Filled 2017-03-08: qty 10
  Filled 2017-03-08: qty 5

## 2017-03-08 MED ORDER — PHENYLEPHRINE 40 MCG/ML (10ML) SYRINGE FOR IV PUSH (FOR BLOOD PRESSURE SUPPORT)
80.0000 ug | PREFILLED_SYRINGE | INTRAVENOUS | Status: DC | PRN
  Administered 2017-03-08 (×2): 80 ug via INTRAVENOUS
  Filled 2017-03-08: qty 5

## 2017-03-08 MED ORDER — OXYCODONE-ACETAMINOPHEN 5-325 MG PO TABS: 2.0000 | ORAL_TABLET | ORAL | Status: DC | PRN

## 2017-03-08 MED ORDER — OXYTOCIN BOLUS FROM INFUSION
500.0000 mL | Freq: Once | INTRAVENOUS | Status: AC
  Administered 2017-03-09: 500 mL via INTRAVENOUS

## 2017-03-08 NOTE — MAU Note (Signed)
Pt presents with c/o possible ROM since yesterday.  States began leaking clear fluid that's been saturating undergarments.  +FM.

## 2017-03-08 NOTE — H&P (Signed)
Kristina Stewart is a 31 y.o. female presenting for PROM at unknown time either Saturday or Sunday.  She has had irregular CTX for the last few days.  Active FM, no VB.  Antepartum course complicated by h/o Chiari I malformation; s/p craniotomy and laminectomy in 2006 with normal MRI this pregnancy.  She has been cleared by neurosurgery for vaginal delivery with no concern for Valsalva/active second stage.  The patient has premutation for Fragile X; s/p genetics counseling and declined amnio.  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  The patient has a h/o asthma which has been well controlled this pregnancy.  GBS negative.    OB History    Gravida Para Term Preterm AB Living   2       1 0   SAB TAB Ectopic Multiple Live Births   1             Past Medical History:  Diagnosis Date  . Allergy   . Asthma   . IBS (irritable bowel syndrome)   . Obesity   . Seasonal allergic rhinitis   . Strep pharyngitis   . TMJ (temporomandibular joint syndrome)    Past Surgical History:  Procedure Laterality Date  . BRAIN SURGERY     craniotomy/arnold chiari malformation  . BRAIN SURGERY     craniotomy/arnold chiari malformation   Family History: family history includes Alzheimer's disease in her paternal grandfather; Cancer in her maternal grandfather and maternal grandmother; Diabetes in her paternal grandmother; Fibromyalgia in her paternal grandmother; Fragile X syndrome in her sister; GER disease in her sister; Hyperlipidemia in her father and mother; Hypertension in her father, maternal grandmother, and mother; Irritable bowel syndrome in her father; Thyroid disease in her maternal grandmother and mother. Social History:  reports that she has never smoked. She has never used smokeless tobacco. She reports that she does not drink alcohol or use drugs.     Maternal Diabetes: No Genetic Screening: Abnormal:  Results: Other: premutation  Fragile X (declined amnio), congenital disorder of glycosylation Type Ia carrier; FOB negative. Maternal Ultrasounds/Referrals: Normal Fetal Ultrasounds or other Referrals:  Referred to Materal Fetal Medicine  Maternal Substance Abuse:  No Significant Maternal Medications:  None Significant Maternal Lab Results:  Lab values include: Group B Strep negative Other Comments:  None  ROS Maternal Medical History:  Reason for admission: Rupture of membranes.   Contractions: Onset was 1 week or more ago.   Frequency: rare.   Perceived severity is mild.    Fetal activity: Perceived fetal activity is normal.   Last perceived fetal movement was within the past hour.    Prenatal complications: no prenatal complications Prenatal Complications - Diabetes: none.      Blood pressure 130/65, pulse 75, temperature 98.1 F (36.7 C), temperature source Oral, resp. rate 16, last menstrual period 12/20/2015, unknown if currently breastfeeding. Maternal Exam:  Uterine Assessment: Contraction strength is mild.  Contraction frequency is rare.   Abdomen: Patient reports no abdominal tenderness. Fundal height is c/w dates.   Estimated fetal weight is 7#4.    Introitus: Amniotic fluid character: clear.     Physical Exam  Constitutional: She is oriented to person, place, and time. She appears well-developed and well-nourished.  GI: Soft. There is no rebound and no guarding.  Neurological: She is alert and oriented to person, place, and time.  Skin: Skin is warm and dry.  Psychiatric: She has a normal mood and  affect. Her behavior is normal.    Prenatal labs: ABO, Rh:   Antibody:   Rubella:   RPR:    HBsAg:    HIV:    GBS:     Assessment/Plan: 31yo G2P0 at 572w4d with PROM -Pitocin -Epidural if desired  -Chiari I malformation-Patient cleared by neurosurg for active second stage; I offered vacuum assistance but patient declines. -Anticipate NSVD  Ahmaad Neidhardt 03/08/2017, 6:14 PM

## 2017-03-08 NOTE — MAU Note (Signed)
Pt started have intermittent contractions on Friday, irregular.  Started having increased discharge yesterday, is somewhat watery.  Not gushing but is a consistent trickle, is watery, underwear has been wet @ times.  Denies bleeding.  Reports good fetal movement.

## 2017-03-08 NOTE — Anesthesia Preprocedure Evaluation (Signed)
Anesthesia Evaluation  Patient identified by MRN, date of birth, ID band Patient awake    Reviewed: Allergy & Precautions, NPO status , Patient's Chart, lab work & pertinent test results  History of Anesthesia Complications Negative for: history of anesthetic complications  Airway Mallampati: III  TM Distance: >3 FB Neck ROM: Full    Dental  (+) Dental Advisory Given   Pulmonary asthma (last inhaler needed 2 weeks ago) ,    breath sounds clear to auscultation       Cardiovascular negative cardio ROS   Rhythm:Regular Rate:Normal     Neuro/Psych S/p Chiari malformation repair: no sequelae, MRI with no cord compression negative psych ROS   GI/Hepatic Neg liver ROS, GERD  ,  Endo/Other  Morbid obesity  Renal/GU negative Renal ROS     Musculoskeletal   Abdominal (+) + obese,   Peds  Hematology plt 277k   Anesthesia Other Findings   Reproductive/Obstetrics (+) Pregnancy                             Anesthesia Physical Anesthesia Plan  ASA: III  Anesthesia Plan: Epidural   Post-op Pain Management:    Induction:   PONV Risk Score and Plan: Treatment may vary due to age or medical condition  Airway Management Planned: Natural Airway  Additional Equipment:   Intra-op Plan:   Post-operative Plan:   Informed Consent: I have reviewed the patients History and Physical, chart, labs and discussed the procedure including the risks, benefits and alternatives for the proposed anesthesia with the patient or authorized representative who has indicated his/her understanding and acceptance.   Dental advisory given  Plan Discussed with:   Anesthesia Plan Comments: (Patient identified. Risks/Benefits/Options discussed with patient including but not limited to bleeding, infection, nerve damage, paralysis, failed block, incomplete pain control, headache, blood pressure changes, nausea, vomiting,  reactions to medication both or allergic, itching and postpartum back pain. Confirmed with bedside nurse the patient's most recent platelet count. Confirmed with patient that they are not currently taking any anticoagulation, have any bleeding history or any family history of bleeding disorders. Patient expressed understanding and wished to proceed. All questions were answered. )        Anesthesia Quick Evaluation

## 2017-03-08 NOTE — MAU Provider Note (Signed)
History   [id number]   Chief Complaint  Patient presents with  . Rupture of Membranes    HPI Kristina Stewart is a 31 y.o. female  G2P0010 @38 .4 weeks here with report of leaking clear odorless fluid since yesterday afternoon.  Leaking of fluid has continued.  Pt reports contractions irregular. She denies vaginal bleeding. She reports good fetal movement. All other systems negative.    Patient's last menstrual period was 12/20/2015.  OB History  Gravida Para Term Preterm AB Living  2       1 0  SAB TAB Ectopic Multiple Live Births  1            # Outcome Date GA Lbr Len/2nd Weight Sex Delivery Anes PTL Lv  2 Current           1 SAB               Past Medical History:  Diagnosis Date  . Allergy   . Asthma   . IBS (irritable bowel syndrome)   . Obesity   . Seasonal allergic rhinitis   . Strep pharyngitis   . TMJ (temporomandibular joint syndrome)     Family History  Problem Relation Age of Onset  . Hypertension Mother   . Thyroid disease Mother   . Hyperlipidemia Mother   . Hypertension Father   . Irritable bowel syndrome Father   . Hyperlipidemia Father   . GER disease Sister   . Fragile X syndrome Sister   . Alzheimer's disease Paternal Grandfather   . Diabetes Paternal Grandmother   . Fibromyalgia Paternal Grandmother   . Thyroid disease Maternal Grandmother        HYPOTHYROID  . Hypertension Maternal Grandmother   . Cancer Maternal Grandmother        LUNG  . Cancer Maternal Grandfather        LUNG    Social History   Social History  . Marital status: Married    Spouse name: N/A  . Number of children: N/A  . Years of education: N/A   Social History Main Topics  . Smoking status: Never Smoker  . Smokeless tobacco: Never Used  . Alcohol use No  . Drug use: No  . Sexual activity: Yes    Birth control/ protection: None   Other Topics Concern  . None   Social History Narrative   ** Merged History Encounter **        Allergies   Allergen Reactions  . Almond (Diagnostic) Anaphylaxis  . Other Anaphylaxis    PT IS ALLERGIC PITTED FRUITS  . Pistachio Nut (Diagnostic) Anaphylaxis  . Latex Rash    No current facility-administered medications on file prior to encounter.    Current Outpatient Prescriptions on File Prior to Encounter  Medication Sig Dispense Refill  . albuterol (PROVENTIL) (2.5 MG/3ML) 0.083% nebulizer solution Take 2.5 mg by nebulization every 6 (six) hours as needed for wheezing or shortness of breath.        Review of Systems  Gastrointestinal: Negative for abdominal pain.  Genitourinary: Positive for vaginal discharge.     Physical Exam   Vitals:   03/08/17 1611 03/08/17 1633  BP: (!) 145/84 132/81  Pulse: 78 89  Resp: 18   Temp: 98.3 F (36.8 C)   TempSrc: Oral     Physical Exam  Constitutional: She is oriented to person, place, and time. She appears well-developed and well-nourished. No distress.  HENT:  Head: Normocephalic and atraumatic.  Neck: Normal range of motion.  Cardiovascular: Normal rate.   Respiratory: Effort normal.  GI: Soft. She exhibits no distension. There is no tenderness.  gravid  Genitourinary:  Genitourinary Comments: External: no lesions or erythema Vagina: rugated, pink, moist, +pool, clear fluid and mucous, fern positive   Musculoskeletal: Normal range of motion.  Neurological: She is alert and oriented to person, place, and time.  Skin: Skin is warm and dry.  Psychiatric: She has a normal mood and affect.  EFM: 135 bpm, mod variability, + accels, no decels Toco: none  No results found for this or any previous visit (from the past 24 hour(s)).  MAU Course  Procedures  MDM Labs ordered and reviewed.   Assessment and Plan  [redacted] weeks gestation Reactive NST PROM at term Admit to LD Mngt per Dr. Trenton Gammon, [place], [facility] 03/08/2017 5:32 PM

## 2017-03-08 NOTE — MAU Note (Signed)
Urine in lab 

## 2017-03-09 ENCOUNTER — Encounter (HOSPITAL_COMMUNITY): Payer: Self-pay | Admitting: *Deleted

## 2017-03-09 DIAGNOSIS — O99214 Obesity complicating childbirth: Secondary | ICD-10-CM | POA: Diagnosis not present

## 2017-03-09 DIAGNOSIS — Z91040 Latex allergy status: Secondary | ICD-10-CM | POA: Diagnosis not present

## 2017-03-09 DIAGNOSIS — Z91018 Allergy to other foods: Secondary | ICD-10-CM | POA: Diagnosis not present

## 2017-03-09 DIAGNOSIS — Z3A38 38 weeks gestation of pregnancy: Secondary | ICD-10-CM | POA: Diagnosis not present

## 2017-03-09 DIAGNOSIS — O99354 Diseases of the nervous system complicating childbirth: Secondary | ICD-10-CM | POA: Diagnosis not present

## 2017-03-09 DIAGNOSIS — Z6841 Body Mass Index (BMI) 40.0 and over, adult: Secondary | ICD-10-CM | POA: Diagnosis not present

## 2017-03-09 DIAGNOSIS — O4292 Full-term premature rupture of membranes, unspecified as to length of time between rupture and onset of labor: Secondary | ICD-10-CM | POA: Diagnosis not present

## 2017-03-09 LAB — RPR: RPR: NONREACTIVE

## 2017-03-09 MED ORDER — SENNOSIDES-DOCUSATE SODIUM 8.6-50 MG PO TABS
2.0000 | ORAL_TABLET | ORAL | Status: DC
  Administered 2017-03-10 (×2): 2 via ORAL
  Filled 2017-03-09 (×2): qty 2

## 2017-03-09 MED ORDER — DIBUCAINE 1 % RE OINT: 1.0000 "application " | TOPICAL_OINTMENT | RECTAL | Status: DC | PRN

## 2017-03-09 MED ORDER — ZOLPIDEM TARTRATE 5 MG PO TABS
5.0000 mg | ORAL_TABLET | Freq: Every evening | ORAL | Status: DC | PRN
Start: 2017-03-09 — End: 2017-03-11

## 2017-03-09 MED ORDER — ONDANSETRON HCL 4 MG/2ML IJ SOLN: 4.0000 mg | INTRAMUSCULAR | Status: DC | PRN

## 2017-03-09 MED ORDER — DIPHENHYDRAMINE HCL 25 MG PO CAPS: 25.0000 mg | ORAL_CAPSULE | Freq: Four times a day (QID) | ORAL | Status: DC | PRN

## 2017-03-09 MED ORDER — EPHEDRINE 5 MG/ML INJ: 10.0000 mg | INTRAVENOUS | Status: DC | PRN

## 2017-03-09 MED ORDER — BISACODYL 10 MG RE SUPP
10.0000 mg | Freq: Every day | RECTAL | Status: DC | PRN
Start: 2017-03-09 — End: 2017-03-11

## 2017-03-09 MED ORDER — SIMETHICONE 80 MG PO CHEW: 80.0000 mg | CHEWABLE_TABLET | ORAL | Status: DC | PRN

## 2017-03-09 MED ORDER — ACETAMINOPHEN 325 MG PO TABS
650.0000 mg | ORAL_TABLET | ORAL | Status: DC | PRN
  Administered 2017-03-09 – 2017-03-11 (×5): 650 mg via ORAL
  Filled 2017-03-09 (×5): qty 2

## 2017-03-09 MED ORDER — OXYCODONE-ACETAMINOPHEN 5-325 MG PO TABS: 2.0000 | ORAL_TABLET | ORAL | Status: DC | PRN

## 2017-03-09 MED ORDER — LACTATED RINGERS IV SOLN
INTRAVENOUS | Status: DC
  Administered 2017-03-09: 03:00:00 via INTRAUTERINE

## 2017-03-09 MED ORDER — FLEET ENEMA 7-19 GM/118ML RE ENEM: 1.0000 | ENEMA | Freq: Every day | RECTAL | Status: DC | PRN

## 2017-03-09 MED ORDER — ONDANSETRON HCL 4 MG PO TABS: 4.0000 mg | ORAL_TABLET | ORAL | Status: DC | PRN

## 2017-03-09 MED ORDER — PHENYLEPHRINE 40 MCG/ML (10ML) SYRINGE FOR IV PUSH (FOR BLOOD PRESSURE SUPPORT): 80.0000 ug | PREFILLED_SYRINGE | INTRAVENOUS | Status: DC | PRN

## 2017-03-09 MED ORDER — IBUPROFEN 800 MG PO TABS
800.0000 mg | ORAL_TABLET | Freq: Three times a day (TID) | ORAL | Status: DC | PRN
  Administered 2017-03-09 – 2017-03-11 (×7): 800 mg via ORAL
  Filled 2017-03-09 (×7): qty 1

## 2017-03-09 MED ORDER — DIPHENHYDRAMINE HCL 50 MG/ML IJ SOLN
12.5000 mg | INTRAMUSCULAR | Status: DC | PRN
Start: 2017-03-09 — End: 2017-03-09

## 2017-03-09 MED ORDER — MEASLES, MUMPS & RUBELLA VAC ~~LOC~~ INJ
0.5000 mL | INJECTION | Freq: Once | SUBCUTANEOUS | Status: DC
  Filled 2017-03-09: qty 0.5

## 2017-03-09 MED ORDER — LACTATED RINGERS IV SOLN: 500.0000 mL | Freq: Once | INTRAVENOUS | Status: DC

## 2017-03-09 MED ORDER — LIDOCAINE HCL (PF) 1 % IJ SOLN
INTRAMUSCULAR | Status: DC | PRN
  Administered 2017-03-08 (×2): 6 mL via EPIDURAL

## 2017-03-09 MED ORDER — WITCH HAZEL-GLYCERIN EX PADS: 1.0000 "application " | MEDICATED_PAD | CUTANEOUS | Status: DC | PRN

## 2017-03-09 MED ORDER — TETANUS-DIPHTH-ACELL PERTUSSIS 5-2.5-18.5 LF-MCG/0.5 IM SUSP
0.5000 mL | Freq: Once | INTRAMUSCULAR | Status: DC
Start: 2017-03-10 — End: 2017-03-11

## 2017-03-09 MED ORDER — PRENATAL MULTIVITAMIN CH
1.0000 | ORAL_TABLET | Freq: Every day | ORAL | Status: DC
  Administered 2017-03-10 – 2017-03-11 (×2): 1 via ORAL
  Filled 2017-03-09 (×2): qty 1

## 2017-03-09 MED ORDER — COCONUT OIL OIL
1.0000 "application " | TOPICAL_OIL | Status: DC | PRN
  Administered 2017-03-10: 1 via TOPICAL
  Filled 2017-03-09: qty 120

## 2017-03-09 MED ORDER — BENZOCAINE-MENTHOL 20-0.5 % EX AERO
1.0000 "application " | INHALATION_SPRAY | CUTANEOUS | Status: DC | PRN
  Administered 2017-03-09: 1 via TOPICAL
  Filled 2017-03-09: qty 56

## 2017-03-09 MED ORDER — OXYCODONE-ACETAMINOPHEN 5-325 MG PO TABS: 1.0000 | ORAL_TABLET | ORAL | Status: DC | PRN

## 2017-03-09 MED ORDER — ALBUTEROL SULFATE (2.5 MG/3ML) 0.083% IN NEBU: 3.0000 mL | INHALATION_SOLUTION | Freq: Four times a day (QID) | RESPIRATORY_TRACT | Status: DC | PRN

## 2017-03-09 NOTE — Progress Notes (Signed)
Assumed care now, pt FHR cat I , pushing now X 2 hrs, reasonable descent with pushing effort, will cont to monitor

## 2017-03-09 NOTE — Anesthesia Postprocedure Evaluation (Signed)
Anesthesia Post Note  Patient: Kristina Stewart  Procedure(s) Performed: * No procedures listed *     Patient location during evaluation: Mother Baby Anesthesia Type: Epidural Level of consciousness: awake and alert and oriented Pain management: satisfactory to patient Vital Signs Assessment: post-procedure vital signs reviewed and stable Respiratory status: spontaneous breathing and nonlabored ventilation Cardiovascular status: stable Postop Assessment: no headache, no backache, no signs of nausea or vomiting, adequate PO intake and patient able to bend at knees (patient up walking) Anesthetic complications: no    Last Vitals:  Vitals:   03/09/17 1230 03/09/17 1300  BP: (!) 104/55 (!) 111/43  Pulse: 78 65  Resp: 18 20  Temp:  37.3 C  SpO2:      Last Pain:  Vitals:   03/09/17 1300  TempSrc: Oral  PainSc:    Pain Goal: Patients Stated Pain Goal: 8 (03/09/17 0720)               Madison HickmanGREGORY,Sola Margolis

## 2017-03-09 NOTE — Anesthesia Procedure Notes (Signed)
Epidural Patient location during procedure: OB Start time: 03/08/2017 10:50 PM End time: 03/08/2017 11:15 PM  Staffing Anesthesiologist: Jairo BenJACKSON, Terelle Dobler Performed: anesthesiologist   Preanesthetic Checklist Completed: patient identified, surgical consent, pre-op evaluation, timeout performed, IV checked, risks and benefits discussed and monitors and equipment checked  Epidural Patient position: sitting Prep: site prepped and draped and DuraPrep Patient monitoring: heart rate, continuous pulse ox and blood pressure Approach: midline Location: L3-L4 Injection technique: LOR air  Needle:  Needle type: Tuohy  Needle gauge: 17 G Needle length: 9 cm Needle insertion depth: 7 cm Catheter type: closed end flexible Catheter size: 19 Gauge Catheter at skin depth: 13 cm Test dose: negative (1% lidocaine)  Assessment Events: blood not aspirated, injection not painful, no injection resistance, negative IV test and no paresthesia  Additional Notes Pt identified in Labor room.  Monitors applied. Working IV access confirmed. Sterile prep, drape lumbar spine.  1% lido local L 3,4.  #17ga Touhy LOR air at 7 cm L 3,4, cath in easily to 13 cm skin. Test dose OK, cath dosed and infusion begun.  Patient asymptomatic, VSS, no heme aspirated, tolerated well.  Sandford Craze Erielle Gawronski, MDReason for block:procedure for pain

## 2017-03-10 LAB — CBC
HCT: 30.2 % — ABNORMAL LOW (ref 36.0–46.0)
HEMOGLOBIN: 10.1 g/dL — AB (ref 12.0–15.0)
MCH: 27.5 pg (ref 26.0–34.0)
MCHC: 33.4 g/dL (ref 30.0–36.0)
MCV: 82.3 fL (ref 78.0–100.0)
PLATELETS: 204 10*3/uL (ref 150–400)
RBC: 3.67 MIL/uL — ABNORMAL LOW (ref 3.87–5.11)
RDW: 15.7 % — AB (ref 11.5–15.5)
WBC: 13.2 10*3/uL — ABNORMAL HIGH (ref 4.0–10.5)

## 2017-03-10 NOTE — Lactation Note (Addendum)
This note was copied from a baby's chart. Lactation Consultation Note New mom having difficulty obtaining deep latch.  Mom has "V" shaped breast. Rt. Breast 1 cup size larger than Lt. Breast also has wide space between breast, breast appears to have connective tissue between breast, or just body shape???  Lt. Breast has less breast tissue, ? Adequate breast tissue for BF. Rt. Has more that ?may but LC feels mom may end up supplementing. Will have to wait and see.  "V" shaped breast has small short shaft everted nipples. Mom c/o painful latches. Baby didn't appear to be able to get a deep latch. Mom stated he chomps, not a lot of suckling. Assessed suck, took a lot of stimulation for baby to finally suckle on gloved finger. Hand expressed w/a dot of colostrum to tip of breast. Not enough to supplement with.  Fitted mom w/#16 NS. Latched baby, mom stated much better. Baby BF better as well. Mom needs to support breast w/"C" hold to firm breast for baby to latch. Mom demonstrated application.  Shells given, mom applied in gown.  Hand pump given to evert nipple prior to latching.  DEBP set up. Encouraged mom to pump for stimulation. Mom very tired, request to sleep some then pump later.  Educated about newborn behavior, STS, I&O, cluster feeding, supply and demand.  WH/LC brochure given w/resources, support groups and LC services.   Mom encouraged to feed baby 8-12 times/24 hours and with feeding cues. educated about newborn behaviorWH/LC brochure given w/resources, support groups and LC services.  Patient Name: Kristina Gertie FeyMichelle Willeford ZOXWR'UToday's Date: 03/10/2017 Reason for consult: Initial assessment   Maternal Data    Feeding Feeding Type: Breast Fed Length of feed: 30 min  LATCH Score Latch: Repeated attempts needed to sustain latch, nipple held in mouth throughout feeding, stimulation needed to elicit sucking reflex.  Audible Swallowing: A few with stimulation  Type of Nipple: Everted  at rest and after stimulation  Comfort (Breast/Nipple): Filling, red/small blisters or bruises, mild/mod discomfort  Hold (Positioning): Full assist, staff holds infant at breast  LATCH Score: 5  Interventions Interventions: Breast feeding basics reviewed;Support pillows;Assisted with latch;Position options;Skin to skin;Expressed milk;Breast massage;Coconut oil;Hand express;Shells;Pre-pump if needed;Hand pump;DEBP;Breast compression;Adjust position  Lactation Tools Discussed/Used Tools: Shells;Pump;Coconut oil;Nipple Shields Nipple shield size: 16 Shell Type: Inverted Breast pump type: Double-Electric Breast Pump;Manual Pump Review: Setup, frequency, and cleaning;Milk Storage Initiated by:: Peri JeffersonL. Giorgia Wahler RN IBCLC Date initiated:: 03/10/17   Consult Status Consult Status: Follow-up Date: 03/10/17 Follow-up type: In-patient    Charyl DancerCARVER, Lowanda Cashaw G 03/10/2017, 3:26 AM

## 2017-03-10 NOTE — Progress Notes (Signed)
Post Partum Day 1 Subjective: no complaints, up ad lib, voiding, tolerating PO and + flatus  Objective: Blood pressure (!) 103/52, pulse 70, temperature (!) 97.5 F (36.4 C), temperature source Oral, resp. rate 18, height 5\' 5"  (1.651 m), weight 260 lb (117.9 kg), last menstrual period 12/20/2015, SpO2 100 %, unknown if currently breastfeeding.  Physical Exam:  General: alert, cooperative and no distress Lochia: appropriate Uterine Fundus: firm Incision: healing well DVT Evaluation: No evidence of DVT seen on physical exam.   Recent Labs  03/08/17 1820 03/10/17 0538  HGB 12.4 10.1*  HCT 36.6 30.2*    Assessment/Plan: Plan for discharge tomorrow   LOS: 2 days   Kristina Stewart,Kristina Stewart E 03/10/2017, 8:42 AM

## 2017-03-11 NOTE — Discharge Summary (Signed)
Obstetric Discharge Summary Reason for Admission: onset of labor Prenatal Procedures: none Intrapartum Procedures: vacuum Postpartum Procedures: none Complications-Operative and Postpartum: 2nd degree perineal laceration Hemoglobin  Date Value Ref Range Status  03/10/2017 10.1 (L) 12.0 - 15.0 g/dL Final   HGB  Date Value Ref Range Status  03/02/2012 13.0 11.6 - 15.9 g/dL Final   HCT  Date Value Ref Range Status  03/10/2017 30.2 (L) 36.0 - 46.0 % Final  03/02/2012 38.6 34.8 - 46.6 % Final    Physical Exam:  General: alert, cooperative and appears stated age 67Lochia: appropriate Uterine Fundus: firm Incision: healing well, no significant drainage, no dehiscence, no significant erythema DVT Evaluation: No evidence of DVT seen on physical exam.  Discharge Diagnoses: Term Pregnancy-delivered  Discharge Information: Date: 03/11/2017 Activity: pelvic rest Diet: routine Medications: None Condition: stable Instructions: refer to practice specific booklet Discharge to: home   Newborn Data: Live born female  Birth Weight: 6 lb 11.4 oz (3045 g) APGAR: 7, 9  Home with mother.  Kristina Stewart,Tanicia L 03/11/2017, 9:22 AM

## 2017-03-11 NOTE — Lactation Note (Addendum)
This note was copied from a baby's chart. Lactation Consultation Note Mom called out needing BF assistance. Baby had been crying for a while, mom was crying when LC entered rm. Mom stated baby had been BF all night, she was dry and didn't have anything to give him and he was hungry. Baby's lips were chapped some and red. In 42 hours the baby has had 10 voids, 8 stools and 3 documented emesis. Very large output!   Hand expressed mom's breast, unable to get any colostrum. Concerned mom may need to supplement until BM comes in, then maybe after that. Lt. Breast has a lot less breast tissue than the Rt. Discussed w/mom unable to tell about milk supply until milk comes in. Did mention to mom, LC felt that mom would have to BF on both breast, and not just on Lt. Breast. ? If Rt. Will be enough w/o Lt.   Encouraged mom to post-pump after BF to induce lactation. Mom states that she may end up pumping and bottle feeding d/t painful latching. Mom states that using NS is very helpful w/pain. Mom has red bruised to Rt. Nipple. Lt. Nipple red as well. Mom had been using coconut oil. Gave mom comfort gels. Discussed cleansing coconut oil off before application of comfort gels.   Discussed w/mom supplementing until her milk comes in or until baby appears more satisfied. Alimentum given w/slow flow nipple. Mom stated the baby would be getting bottles at home w/BM.  Gave supplementing feeding sheet. It took a long time for the baby to take 14 ml of formula. Baby appeared to be suckling hard and fast on the bottle, LC thought baby would have taken in a lot more. Baby was cont. Sucking. Stopped to burp then started feeding again. After bottle taken out of baby's mouth baby had the cup mouth and tongue still suckling then making smacking noises.  Baby appeared satisfied after feeding. Discussed w/mom to BF first, only supplement every 3 hrs.   Patient Name: Kristina Stewart WUJWJ'XToday's Date: 03/11/2017 Reason for  consult: Follow-up assessment;Nipple pain/trauma   Maternal Data    Feeding Feeding Type: Formula Nipple Type: Slow - flow Length of feed: 10 min  LATCH Score Latch: Repeated attempts needed to sustain latch, nipple held in mouth throughout feeding, stimulation needed to elicit sucking reflex.  Audible Swallowing: None  Type of Nipple: Everted at rest and after stimulation  Comfort (Breast/Nipple): Engorged, cracked, bleeding, large blisters, severe discomfort  Hold (Positioning): Assistance needed to correctly position infant at breast and maintain latch.  LATCH Score: 6  Interventions Interventions: Hand express;Breast massage;Comfort gels  Lactation Tools Discussed/Used Tools: Comfort gels Nipple shield size: 16   Consult Status Consult Status: Follow-up Date: 03/11/17 Follow-up type: In-patient    Kristina Stewart, Kristina Stewart 03/11/2017, 4:36 AM

## 2017-03-11 NOTE — Lactation Note (Signed)
This note was copied from a baby's chart. Lactation Consultation Note  Patient Name: Boy Gertie FeyMichelle Tickner ZOXWR'UToday's Date: 03/11/2017 Reason for consult: Follow-up assessment;Mother's request  Mom's breast anatomy suggests IGT: -wide-spaced & V-shaped breasts -little to no palpable duct tissue in L breast -little veining noted on breasts -only minor breast changes with pregnancy.  Mom has a Medela PIS for home ([facility] pump) & I showed her how to use it. Size 24 flanges are appropriate for her at this time. Mom is giving formula w/a bottle & will pump. So far, she has only pumped twice. She understands that her milk will come in at some baseline, but she could improve that baseline by more regular pumping. I made Mom aware that she may want to inquire about switching to regular formula if she has low volume once she has finished lactogenesis II.   Lurline HareRichey, Taequan Stockhausen [facility] 03/11/2017, 10:58 AM

## 2017-04-21 DIAGNOSIS — Z1389 Encounter for screening for other disorder: Secondary | ICD-10-CM | POA: Diagnosis not present

## 2017-04-21 MED FILL — NUVARING VAGINAL RING: 0.12-0.015 | 84 days supply | Qty: 3 | Fill #0

## 2017-06-15 ENCOUNTER — Telehealth: Payer: 59 | Admitting: Family

## 2017-06-15 DIAGNOSIS — A09 Infectious gastroenteritis and colitis, unspecified: Secondary | ICD-10-CM

## 2017-06-15 MED ORDER — CIPROFLOXACIN HCL 500 MG PO TABS: 500.0000 mg | ORAL_TABLET | Freq: Two times a day (BID) | ORAL | 0 refills | Status: DC

## 2017-06-15 MED FILL — CIPROFLOXACIN HCL 500 MG TA: 500 | 5 days supply | Qty: 10 | Fill #0

## 2017-06-15 NOTE — Progress Notes (Signed)
We are sorry that you are not feeling well.  Here is how we plan to help!  Based on what you have shared with me it looks like you have Acute Infectious Diarrhea.  Most cases of acute diarrhea are due to infections with virus and bacteria and are self-limited conditions lasting less than 14 days.  For your symptoms you may take Imodium 2 mg tablets that are over the counter at your local pharmacy. Take two tablet now and then one after each loose stool up to 6 a day.  Antibiotics are not needed for most people with diarrhea.   Optional: I have sent in Cipro 500 mg two tablets twice a day for five days   HOME CARE  We recommend changing your diet to help with your symptoms for the next few days.  Drink plenty of fluids that contain water salt and sugar. Sports drinks such as Gatorade may help.   You may try broths, soups, bananas, applesauce, soft breads, mashed potatoes or crackers.   You are considered infectious for as long as the diarrhea continues. Hand washing or use of alcohol based hand sanitizers is recommend.  It is best to stay out of work or school until your symptoms stop.   GET HELP RIGHT AWAY  If you have dark yellow colored urine or do not pass urine frequently you should drink more fluids.    If your symptoms worsen   If you feel like you are going to pass out (faint)  You have a new problem  MAKE SURE YOU   Understand these instructions.  Will watch your condition.  Will get help right away if you are not doing well or get worse.  Your e-visit answers were reviewed by a board certified advanced clinical practitioner to complete your personal care plan.  Depending on the condition, your plan could have included both over the counter or prescription medications.  If there is a problem please reply  once you have received a response from your provider.  Your safety is important to us.  If you have drug allergies check your prescription carefully.    You  can use MyChart to ask questions about today's visit, request a non-urgent call back, or ask for a work or school excuse for 24 hours related to this e-Visit. If it has been greater than 24 hours you will need to follow up with your provider, or enter a new e-Visit to address those concerns.   You will get an e-mail in the next two days asking about your experience.  I hope that your e-visit has been valuable and will speed your recovery. Thank you for using e-visits.   

## 2017-07-16 MED FILL — NUVARING VAGINAL RING: 0.12-0.015 | 84 days supply | Qty: 3 | Fill #1

## 2017-08-05 ENCOUNTER — Other Ambulatory Visit: Payer: Self-pay | Admitting: Occupational Medicine

## 2017-08-05 ENCOUNTER — Ambulatory Visit: Payer: Self-pay

## 2017-08-05 DIAGNOSIS — M25572 Pain in left ankle and joints of left foot: Secondary | ICD-10-CM

## 2017-08-10 ENCOUNTER — Other Ambulatory Visit: Payer: 59 | Admitting: Internal Medicine

## 2017-08-10 DIAGNOSIS — Z1321 Encounter for screening for nutritional disorder: Secondary | ICD-10-CM

## 2017-08-10 DIAGNOSIS — Z0000 Encounter for general adult medical examination without abnormal findings: Secondary | ICD-10-CM

## 2017-08-10 DIAGNOSIS — Z1329 Encounter for screening for other suspected endocrine disorder: Secondary | ICD-10-CM

## 2017-08-10 DIAGNOSIS — Z13220 Encounter for screening for lipoid disorders: Secondary | ICD-10-CM

## 2017-08-11 LAB — CBC WITH DIFFERENTIAL/PLATELET
BASOS PCT: 0.4 %
Basophils Absolute: 43 cells/uL (ref 0–200)
EOS ABS: 162 {cells}/uL (ref 15–500)
Eosinophils Relative: 1.5 %
HEMATOCRIT: 40.1 % (ref 35.0–45.0)
HEMOGLOBIN: 13.4 g/dL (ref 11.7–15.5)
LYMPHS ABS: 1879 {cells}/uL (ref 850–3900)
MCH: 26.9 pg — ABNORMAL LOW (ref 27.0–33.0)
MCHC: 33.4 g/dL (ref 32.0–36.0)
MCV: 80.5 fL (ref 80.0–100.0)
MPV: 10.6 fL (ref 7.5–12.5)
Monocytes Relative: 5.9 %
NEUTROS ABS: 8078 {cells}/uL — AB (ref 1500–7800)
Neutrophils Relative %: 74.8 %
Platelets: 322 10*3/uL (ref 140–400)
RBC: 4.98 10*6/uL (ref 3.80–5.10)
RDW: 13.2 % (ref 11.0–15.0)
TOTAL LYMPHOCYTE: 17.4 %
WBC: 10.8 10*3/uL (ref 3.8–10.8)
WBCMIX: 637 {cells}/uL (ref 200–950)

## 2017-08-11 LAB — LIPID PANEL
CHOLESTEROL: 175 mg/dL (ref <200)
HDL: 48 mg/dL — ABNORMAL LOW (ref >50)
LDL Cholesterol (Calc): 101 mg/dL (calc) — ABNORMAL HIGH
Non-HDL Cholesterol (Calc): 127 mg/dL (calc) (ref <130)
Total CHOL/HDL Ratio: 3.6 (calc) (ref <5.0)
Triglycerides: 158 mg/dL — ABNORMAL HIGH (ref <150)

## 2017-08-11 LAB — COMPLETE METABOLIC PANEL WITH GFR
AG Ratio: 1.4 (calc) (ref 1.0–2.5)
ALT: 11 U/L (ref 6–29)
AST: 13 U/L (ref 10–30)
Albumin: 4 g/dL (ref 3.6–5.1)
Alkaline phosphatase (APISO): 72 U/L (ref 33–115)
BUN: 13 mg/dL (ref 7–25)
CALCIUM: 9.4 mg/dL (ref 8.6–10.2)
CO2: 21 mmol/L (ref 20–32)
CREATININE: 0.79 mg/dL (ref 0.50–1.10)
Chloride: 106 mmol/L (ref 98–110)
GFR, EST NON AFRICAN AMERICAN: 101 mL/min/{1.73_m2} (ref >=60)
GFR, Est African American: 117 mL/min/{1.73_m2} (ref >=60)
GLOBULIN: 2.9 g/dL (ref 1.9–3.7)
GLUCOSE: 81 mg/dL (ref 65–99)
Potassium: 4.2 mmol/L (ref 3.5–5.3)
SODIUM: 139 mmol/L (ref 135–146)
Total Bilirubin: 0.3 mg/dL (ref 0.2–1.2)
Total Protein: 6.9 g/dL (ref 6.1–8.1)

## 2017-08-11 LAB — TSH: TSH: 2.54 m[IU]/L

## 2017-08-11 LAB — VITAMIN D 25 HYDROXY (VIT D DEFICIENCY, FRACTURES): Vit D, 25-Hydroxy: 19 ng/mL — ABNORMAL LOW (ref 30–100)

## 2017-08-12 ENCOUNTER — Encounter: Payer: Self-pay | Admitting: Internal Medicine

## 2017-08-12 ENCOUNTER — Ambulatory Visit (INDEPENDENT_AMBULATORY_CARE_PROVIDER_SITE_OTHER): Payer: No Typology Code available for payment source | Admitting: Internal Medicine

## 2017-08-12 VITALS — BP 110/80 | HR 94 | Ht 64.75 in | Wt 238.0 lb

## 2017-08-12 DIAGNOSIS — Z0000 Encounter for general adult medical examination without abnormal findings: Secondary | ICD-10-CM | POA: Diagnosis not present

## 2017-08-12 DIAGNOSIS — K580 Irritable bowel syndrome with diarrhea: Secondary | ICD-10-CM | POA: Diagnosis not present

## 2017-08-12 DIAGNOSIS — E559 Vitamin D deficiency, unspecified: Secondary | ICD-10-CM

## 2017-08-12 LAB — POCT URINALYSIS DIPSTICK
APPEARANCE: NORMAL
Bilirubin, UA: NEGATIVE
Glucose, UA: NEGATIVE
KETONES UA: NEGATIVE
LEUKOCYTES UA: NEGATIVE
NITRITE UA: NEGATIVE
ODOR: NORMAL
PROTEIN UA: NEGATIVE
Spec Grav, UA: 1.015 (ref 1.010–1.025)
Urobilinogen, UA: 0.2 E.U./dL
pH, UA: 6 (ref 5.0–8.0)

## 2017-08-12 MED ORDER — ALPRAZOLAM 0.25 MG PO TABS: 0.2500 mg | ORAL_TABLET | Freq: Every evening | ORAL | 0 refills | Status: DC | PRN

## 2017-08-12 MED ORDER — ALBUTEROL SULFATE HFA 108 (90 BASE) MCG/ACT IN AERS: 1.0000 | INHALATION_SPRAY | Freq: Four times a day (QID) | RESPIRATORY_TRACT | 99 refills | Status: DC | PRN

## 2017-08-12 MED ORDER — ERGOCALCIFEROL 1.25 MG (50000 UT) PO CAPS: 50000.0000 [IU] | ORAL_CAPSULE | ORAL | 0 refills | Status: DC

## 2017-08-12 MED ORDER — HYOSCYAMINE SULFATE 0.125 MG PO TABS
0.1250 mg | ORAL_TABLET | Freq: Three times a day (TID) | ORAL | 0 refills | Status: DC
Start: 2017-08-12 — End: 2018-10-24

## 2017-08-12 MED FILL — VIT D2 1.25 MG (50,000 UNIT: 1.25 MG | 84 days supply | Qty: 12 | Fill #0

## 2017-08-12 MED FILL — HYOSCYAMINE SULF 0.125 MG T: 0.125 | 22 days supply | Qty: 90 | Fill #0

## 2017-08-12 MED FILL — VENTOLIN HFA 90 MCG INHALER: 108 (90 BAS | 25 days supply | Qty: 18 | Fill #0

## 2017-08-12 MED FILL — ALPRAZolam 0.25 MG TABS: 0.25 | 30 days supply | Qty: 30 | Fill #0

## 2017-08-12 NOTE — Progress Notes (Signed)
   Subjective:    Patient ID: Kristina Stewart, female    DOB: 05/08/1986, 32 y.o.   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  In September she delivered a 6 pound 11.4 ounce baby boy via vacuum extraction.  She has returned to work and feels well.  In December she had a bout of gastroenteritis that resolved.  Last seen here for physical exam in  July 2016.  She has a history of MT HFR gene mutation.  She is seen Dr. Cyndie ChimeGranfortuna who does not think this is of significant concern.  She had a craniotomy in May 2006 for Arnold-Chiari malformation in [place].  She has a number of food allergies including almonds, cherries, apples and plum.  She gets an itchy throat when she eats these.  She is anxious about her health.  History of allergic rhinitis.  Had benign positional vertigo in 2013.  History of TMJ syndrome.  Social history: [place] moved [place] from [place].  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  Husband is a high school Editor, commissioningmath teacher.  She does not smoke.  Rare  alcohol consumption.  Family history: Both parents with history of hypertension.  One brother in good health.  One sister with history of fragile X syndrome and ADHD.    Review of Systems  history of irritable bowel symptoms.  Have prescribed Levsin    Objective:   Physical Exam  Constitutional: She is oriented to person, place, and time. She appears well-developed and well-nourished. No distress.  HENT:  Head: Normocephalic and atraumatic.  Right Ear: External ear normal.  Left Ear: External ear normal.  Mouth/Throat: Oropharynx is clear and moist.  Eyes: Conjunctivae and EOM are normal. Pupils are equal, round, and reactive to light. Left eye exhibits no discharge. No scleral icterus.  Neck: Neck supple. No JVD present. No thyromegaly present.  Cardiovascular: Normal rate, regular rhythm, normal heart sounds and  intact distal pulses.  No murmur heard. Pulmonary/Chest: No respiratory distress. She has no wheezes. She has no rales.  Breasts normal female  Abdominal: She exhibits no distension. There is no tenderness. There is no rebound and no guarding.  Genitourinary:  Genitourinary Comments: Deferred to GYN  Musculoskeletal: She exhibits edema.  Lymphadenopathy:    She has no cervical adenopathy.  Neurological: She is alert and oriented to person, place, and time. She has normal reflexes. No cranial nerve deficit. Coordination normal.  Skin: Skin is warm and dry. No rash noted. She is not diaphoretic.  Psychiatric: She has a normal mood and affect. Her behavior is normal. Thought content normal.  Vitals reviewed.         Assessment & Plan:  Normal health maintenance exam  Mild elevation of triglycerides at 158.  Watch diet.  Total cholesterol and LDL cholesterol essentially normal  Vitamin D deficiency- 50,000 units weekly for 12 weeks then 2000 units daily  History of irritable bowel syndrome.  Levsin was prescribed  Plan: Take 50,000 units of Drisdol weekly for vitamin D supplementation for 12 weeks then 2000 units daily.  Levsin as needed for irritable bowel symptoms.  Return in 1 year or as needed.

## 2017-08-22 NOTE — Patient Instructions (Signed)
Take 50,000 units vitamin D3 weekly for 12 weeks then 2000 units vitamin D3 daily.  Levsin prescribed for irritable bowel symptoms.  Return in 1 year or as needed.  It was a pleasure to see you today.

## 2017-08-31 ENCOUNTER — Other Ambulatory Visit (HOSPITAL_COMMUNITY): Payer: Self-pay | Admitting: Orthopedic Surgery

## 2017-08-31 DIAGNOSIS — M25572 Pain in left ankle and joints of left foot: Secondary | ICD-10-CM

## 2017-09-07 ENCOUNTER — Encounter (HOSPITAL_COMMUNITY): Payer: Self-pay | Admitting: Radiology

## 2017-09-07 ENCOUNTER — Ambulatory Visit (HOSPITAL_COMMUNITY)
Admission: RE | Admit: 2017-09-07 | Discharge: 2017-09-07 | Disposition: A | Discharge status: Home / Self Care | Payer: PRIVATE HEALTH INSURANCE | Source: Ambulatory Visit | Attending: Orthopedic Surgery | Admitting: Orthopedic Surgery

## 2017-09-07 DIAGNOSIS — S93492A Sprain of other ligament of left ankle, initial encounter: Secondary | ICD-10-CM | POA: Insufficient documentation

## 2017-09-07 DIAGNOSIS — X58XXXA Exposure to other specified factors, initial encounter: Secondary | ICD-10-CM | POA: Diagnosis not present

## 2017-09-07 DIAGNOSIS — M25572 Pain in left ankle and joints of left foot: Secondary | ICD-10-CM | POA: Diagnosis present

## 2017-09-07 DIAGNOSIS — S93412A Sprain of calcaneofibular ligament of left ankle, initial encounter: Secondary | ICD-10-CM | POA: Insufficient documentation

## 2017-10-21 ENCOUNTER — Ambulatory Visit: Payer: PRIVATE HEALTH INSURANCE | Attending: Orthopedic Surgery | Admitting: Physical Therapy

## 2017-10-21 ENCOUNTER — Encounter: Payer: Self-pay | Admitting: Physical Therapy

## 2017-10-21 DIAGNOSIS — M25672 Stiffness of left ankle, not elsewhere classified: Secondary | ICD-10-CM | POA: Insufficient documentation

## 2017-10-21 DIAGNOSIS — R262 Difficulty in walking, not elsewhere classified: Secondary | ICD-10-CM | POA: Insufficient documentation

## 2017-10-21 DIAGNOSIS — M25572 Pain in left ankle and joints of left foot: Secondary | ICD-10-CM | POA: Diagnosis not present

## 2017-10-21 DIAGNOSIS — M6281 Muscle weakness (generalized): Secondary | ICD-10-CM | POA: Insufficient documentation

## 2017-10-21 NOTE — Therapy (Signed)
[facility] [address] [place], [place], [address] Phone: [phone]   Fax:  [phone]  Physical Therapy Evaluation  Patient Details  Name: Kristina Stewart MRN: [id number] Date of Birth: 1985/09/08 Referring Provider: Worthy Rancher MD   Encounter Date: 10/21/2017  PT End of Session - 10/21/17 1631    Visit Number  1    Number of Visits  8    Date for PT Re-Evaluation  11/18/17    Authorization Type  [facility] Workman's Compensation    PT Start Time  1545    PT Stop Time  1635    PT Time Calculation (min)  50 min    Activity Tolerance  Patient tolerated treatment well    Behavior During Therapy  [facility] for tasks assessed/performed       Past Medical History:  Diagnosis Date  . Allergy   . Asthma   . IBS (irritable bowel syndrome)   . Obesity   . Seasonal allergic rhinitis   . Strep pharyngitis   . TMJ (temporomandibular joint syndrome)     Past Surgical History:  Procedure Laterality Date  . BRAIN SURGERY     craniotomy/arnold chiari malformation  . BRAIN SURGERY     craniotomy/arnold chiari malformation    There were no vitals filed for this visit.   Subjective Assessment - 10/21/17 1551    Subjective  I am in a boot  and a soft brace now.  I fell in a depression in the parking lot at work on 08/04/17.  I thought it was minor sprain until Dr Darrelyn Hillock checked it with an MRI    Pertinent History  fragile X syndrome, Asthma    How long can you sit comfortably?  unlimited    How long can you stand comfortably?  20-30 minutes    How long can you walk comfortably?  limited by boot wearing and uneven gait    Patient Stated Goals  be able to care for my 67 month old. and return to [employer] and work full time standing    Currently in Pain?  Yes    Pain Score  1     Pain Location  Ankle    Pain Orientation  Right    Pain Descriptors / Indicators  Aching;Sharp;Sore    Pain Type  Chronic pain    Pain Onset   More than a month ago    Pain Frequency  Intermittent    Aggravating Factors   stairs, caring 7 month only getting up off the floor,          [facility] PT Assessment - 10/21/17 1554      Assessment   Medical Diagnosis  left ant. talofibuler tear, and calcaneofibular tear. stress fx or navicular/subchondral region     Referring Provider  Worthy Rancher MD    Onset Date/Surgical Date  08/04/17    Hand Dominance  Right    Prior Therapy  none      Precautions   Precautions  Other (comment)    Precaution Comments  wear brace and boot    Required Braces or Orthoses  Other Brace/Splint    Other Brace/Splint  walking boot and soft brace      Restrictions   Weight Bearing Restrictions  Yes    LLE Weight Bearing  Weight bearing as tolerated      Balance Screen   Has the patient fallen in the past 6 months  Yes  How many times?  1 tripped on uneven part of pavement at work    Has the patient had a decrease in activity level because of a fear of falling?   No    Is the patient reluctant to leave their home because of a fear of falling?   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       Cognition   Overall Cognitive Status  Within Functional Limits for tasks assessed      Observation/Other Assessments   Focus on Therapeutic Outcomes (FOTO)   FOTO intake 40% limitation 60 % and 35 % predicted      Functional Tests   Functional tests  Single leg stance      Single Leg Stance   Comments  able to stand for 30 sec bil but left with cocontraction and decreased  motor control      AROM   Overall AROM   Deficits    Right Ankle Dorsiflexion  12    Right Ankle Plantar Flexion  64    Right Ankle Inversion  49    Right Ankle Eversion  30    Left Ankle Dorsiflexion  8 pain in ankle anterior talo fib    Left Ankle Plantar Flexion  40    Left Ankle Inversion  45    Left Ankle Eversion  22      Strength   Overall Strength  Deficits    Overall Strength Comments  Pt unable to heel raise on left foot    Right Hip Flexion  5/5    Right Hip Extension  5/5    Right Hip ABduction  5/5    Left Hip Flexion  5/5    Left Hip Extension  5/5    Left Hip ABduction  5/5    Right Knee Flexion  5/5    Right Knee Extension  5/5    Left Knee Flexion  5/5    Left Knee Extension  5/5    Right Ankle Dorsiflexion  5/5    Right Ankle Plantar Flexion  5/5    Left Ankle Dorsiflexion  4-/5    Left Ankle Plantar Flexion  2/5      Palpation   Palpation comment  tenderness over the lateral anterior left ankle and over medial navicular where aforementioned ligaments attach       Ambulation/Gait   Ambulation/Gait  Yes    Gait Pattern  Antalgic left    Ambulation Surface  Level    Stairs  Yes    Stairs Assistance  7: Independent    Stair Management Technique  One rail Right;Step to pattern must use boot    Number of Stairs  4    Height of Stairs  6    Gait Comments  must wear boot and brace to alternate on stairs at home can only one at a time  Objective measurements completed on examination: See above findings.      [facility] Adult PT Treatment/Exercise - 10/21/17 1554      Ankle Exercises: Seated   Other Seated Ankle Exercises  red t band DF, PF, IN EV for left ankle x 10 each with red t band    Other Seated Ankle Exercises  towel DF stretch 3 x 30 sec and short foot isometric x 20  as demonstrated in clinic               PT Short Term Goals - 10/21/17 1632      PT SHORT TERM GOAL #1   Title  STG=LTG        PT Long Term  Goals - 10/21/17 1633      PT LONG TERM GOAL #1   Title  Pt will be independent with advanced HEP    Time  4    Period  Weeks    Status  New    Target Date  11/18/17      PT LONG TERM GOAL #2   Title  Pt will be able to rise from the floor without exacerbating pain in ankle in order to care for 52 month old son    Time  4    Period  Weeks    Status  New    Target Date  11/18/17      PT LONG TERM GOAL #3   Title  "FOTO will improve from 60% limitation   to  36% limitation   indicating improved functional mobility.         Time  4    Period  Weeks    Status  New    Target Date  11/18/17      PT LONG TERM GOAL #4   Title  Pt will be able to demonstrate increased ankle strength by performing 25 heel raises wearing tennis shoes and to return to [employer] for exercise    Baseline  unable to perform heel raise at evaluation    Time  4    Period  Weeks    Status  New    Target Date  11/18/17      PT LONG TERM GOAL #5   Title  Pt will be able to ambulate over level surfaces with street shoes and no antalgic gait.    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  Pt has been in soft brace and a walking boot since 08-04-17.  She is working and sitting for procedures in which she is used to standing and can tolerate 20 -  30 minutes in boot as of  now. Pt is unable to negotiate steps without boot and must do so with one step at a time.  She is limited in caring for her 7 month onld child and would like to return to exercise at [facility] as PLOF.  Pt will benefit from skilled PT to address pain , AROM, MMT and normal gait as she is limping with antalgic gait and does not use an AD    Clinical Presentation  Stable    Clinical Decision Making  Low    Rehab Potential  Excellent    PT Frequency  2x / week    PT Duration  4 weeks    PT Treatment/Interventions  ADLs/Self Care Home Management;Cryotherapy;Electrical Stimulation;Iontophoresis 4mg /ml Dexamethasone;Moist Heat;Ultrasound;Therapeutic exercise;Therapeutic activities;Functional mobility training;Stair training;Gait training;Neuromuscular re-education;Patient/family education;Manual techniques;Passive range of motion;Taping    PT Next Visit Plan  HEP for standing and gait training.    PT Home Exercise Plan  t band DF, PF, EV and IN    Consulted and Agree with Plan of Care  Patient       Patient will benefit from skilled therapeutic intervention in order to improve the following deficits and impairments:  Abnormal gait, Decreased range of motion, Decreased mobility, Pain, Difficulty walking, Decreased strength  Visit Diagnosis: Acute left ankle pain  Stiffness of left ankle, not elsewhere classified  Difficulty in walking, not elsewhere classified  Muscle weakness (generalized)     Problem List Patient Active Problem List   Diagnosis Date Noted  . PROM (premature rupture of membranes) 03/08/2017  . [redacted] weeks gestation of pregnancy   . Family history of fragile X syndrome 03/25/2016  . History of acute bronchitis with bronchospasm 04/26/2012  . MTHFR mutation (HCC) 04/01/2012  . 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 Dependent edema 01/18/2012  . Leg pain, bilateral 01/18/2012   Garen Lah, PT Certified Exercise Expert for the Aging Adult  10/21/17 5:20  PM Phone: [phone] Fax: [phone]  [facility] [address] [place], [place], [address] Phone: [phone]   Fax:  [phone]  Name: Kristina Stewart MRN: [id number] Date of Birth: Nov 16, 1985

## 2017-10-21 NOTE — Patient Instructions (Signed)
    Marcelino DusterMichelle  Please do short foot to engage your tibialis posterior.  30 x  3 x a day.  Gastroc / Heel Cord Stretch - Seated With Towel   Sit on floor, towel around ball of foot. Gently pull foot in toward body, stretching heel cord and calf. Hold for ___ seconds. Repeat on involved leg. Repeat ___ times. Do ___ times per day.      Access Code: [email or web address]  URL: [email or web address]  Date: 10/21/2017  Prepared by: Garen LahLawrie Bilal Manzer   Exercises  Ankle Eversion with Resistance - 10 reps - 2 sets - 3 hold - 2x daily - 7x weekly  Ankle and Toe Plantarflexion with Resistance - 10 reps - 2 sets - 3 hold - 2x daily - 7x weekly  Ankle Dorsiflexion with Resistance - 10 reps - 2 sets - 3 hold - 2x daily - 7x weekly  Ankle Inversion with Resistance - 10 reps - 2 sets - 3 hold - 2x daily - 7x weekly   Garen LahLawrie Mj Willis, PT Certified Exercise Expert for the Aging Adult  10/21/17 4:27 PM Phone: [phone] Fax: [phone]

## 2017-10-22 MED FILL — ESTARYLLA 0.25-35 MG-MCG TA: 0.25-35 | 84 days supply | Qty: 84 | Fill #0

## 2017-10-28 ENCOUNTER — Ambulatory Visit: Payer: PRIVATE HEALTH INSURANCE | Admitting: Physical Therapy

## 2017-11-03 ENCOUNTER — Telehealth: Payer: No Typology Code available for payment source | Admitting: Nurse Practitioner

## 2017-11-03 DIAGNOSIS — B9789 Other viral agents as the cause of diseases classified elsewhere: Secondary | ICD-10-CM

## 2017-11-03 DIAGNOSIS — J329 Chronic sinusitis, unspecified: Secondary | ICD-10-CM | POA: Diagnosis not present

## 2017-11-03 MED ORDER — FLUTICASONE PROPIONATE 50 MCG/ACT NA SUSP: 2.0000 | Freq: Every day | NASAL | 6 refills | Status: DC

## 2017-11-03 NOTE — Progress Notes (Signed)
We are sorry that you are not feeling well.  Here is how we plan to help!  Based on what you have shared with me it looks like you have sinusitis.  Sinusitis is inflammation and infection in the sinus cavities of the head.  Based on your presentation I believe you most likely have Acute Viral Sinusitis.This is an infection most likely caused by a virus. There is not specific treatment for viral sinusitis other than to help you with the symptoms until the infection runs its course.  You may use an oral decongestant such as Mucinex D or if you have glaucoma or high blood pressure use plain Mucinex. Saline nasal spray help and can safely be used as often as needed for congestion, I have prescribed: Fluticasone nasal spray two sprays in each nostril once a day.  Providers prescribe antibiotics to treat infections caused by bacteria. Antibiotics are very powerful in treating bacterial infections when they are used properly. To maintain their effectiveness, they should be used only when necessary. Overuse of antibiotics has resulted in the development of superbugs that are resistant to treatment!    After careful review of your answers, I would not recommend an antibiotic for your condition.  Antibiotics are not effective against viruses and therefore should not be used to treat them. Common examples of infections caused by viruses include colds and flu.  Some authorities believe that zinc sprays or the use of Echinacea may shorten the course of your symptoms.  Sinus infections are not as easily transmitted as other respiratory infection, however we still recommend that you avoid close contact with loved ones, especially the very young and elderly.  Remember to wash your hands thoroughly throughout the day as this is the number one way to prevent the spread of infection!  Home Care:  Only take medications as instructed by your medical team.  Do not take these medications with alcohol.  A steam or  ultrasonic humidifier can help congestion.  You can place a towel over your head and breathe in the steam from hot water coming from a faucet.  Avoid close contacts especially the very young and the elderly.  Cover your mouth when you cough or sneeze.  Always remember to wash your hands.  Get Help Right Away If:  You develop worsening fever or sinus pain.  You develop a severe head ache or visual changes.  Your symptoms persist after you have completed your treatment plan.  Make sure you  Understand these instructions.  Will watch your condition.  Will get help right away if you are not doing well or get worse.  Your e-visit answers were reviewed by a board certified advanced clinical practitioner to complete your personal care plan.  Depending on the condition, your plan could have included both over the counter or prescription medications.  If there is a problem please reply  once you have received a response from your provider.  Your safety is important to us.  If you have drug allergies check your prescription carefully.    You can use MyChart to ask questions about today's visit, request a non-urgent call back, or ask for a work or school excuse for 24 hours related to this e-Visit. If it has been greater than 24 hours you will need to follow up with your provider, or enter a new e-Visit to address those concerns.  You will get an e-mail in the next two days asking about your experience.  I hope that your   e-visit has been valuable and will speed your recovery. Thank you for using e-visits.     

## 2017-11-05 ENCOUNTER — Encounter

## 2017-11-09 ENCOUNTER — Encounter: Payer: Self-pay | Admitting: Physical Therapy

## 2017-11-09 ENCOUNTER — Ambulatory Visit: Payer: PRIVATE HEALTH INSURANCE | Attending: Orthopedic Surgery | Admitting: Physical Therapy

## 2017-11-09 DIAGNOSIS — R262 Difficulty in walking, not elsewhere classified: Secondary | ICD-10-CM | POA: Insufficient documentation

## 2017-11-09 DIAGNOSIS — M25572 Pain in left ankle and joints of left foot: Secondary | ICD-10-CM | POA: Insufficient documentation

## 2017-11-09 DIAGNOSIS — M6281 Muscle weakness (generalized): Secondary | ICD-10-CM | POA: Diagnosis present

## 2017-11-09 DIAGNOSIS — M25672 Stiffness of left ankle, not elsewhere classified: Secondary | ICD-10-CM | POA: Insufficient documentation

## 2017-11-09 NOTE — Patient Instructions (Signed)
Issued from exercise drawer: Standing ankle 10 X heel lifts, toe lifts starting both then working to  Single leg lifts.   and single leg stand 5 X  With goal of 40 second holds.  daily

## 2017-11-09 NOTE — Therapy (Signed)
[facility] Outpatient Rehabilitation [facility] [address] [place], [place], [address] Phone: [phone]   Fax:  [phone]  Physical Therapy Treatment  Patient Details  Name: Kristina Stewart MRN: [id number] Date of Birth: May 13, 1986 Referring Provider: Worthy Rancher MD   Encounter Date: 11/09/2017  PT End of Session - 11/09/17 1123    Visit Number  2    Number of Visits  8    Date for PT Re-Evaluation  11/18/17    PT Start Time  0847    PT Stop Time  0940    PT Time Calculation (min)  53 min    Activity Tolerance  Patient tolerated treatment well    Behavior During Therapy  [facility] for tasks assessed/performed       Past Medical History:  Diagnosis Date  . Allergy   . Asthma   . IBS (irritable bowel syndrome)   . Obesity   . Seasonal allergic rhinitis   . Strep pharyngitis   . TMJ (temporomandibular joint syndrome)     Past Surgical History:  Procedure Laterality Date  . BRAIN SURGERY     craniotomy/arnold chiari malformation  . BRAIN SURGERY     craniotomy/arnold chiari malformation    There were no vitals filed for this visit.  Subjective Assessment - 11/09/17 0853    Subjective  Pain up to 7/10  yesterday at work.  I was able to wear shoe to PT.  She wears the boot usually.  She see's the MD after this appointment..    Currently in Pain?  Yes    Pain Score  2  up to 7/10 yesterday at work.      Pain Location  Ankle    Pain Orientation  Right;Anterior;Lateral    Pain Descriptors / Indicators  Aching;Sharp;Sore    Pain Onset  More than a month ago    Pain Frequency  Intermittent    Aggravating Factors   walking , standing  ,  going down and up from floor.  catches foot often.    Pain Relieving Factors  rest,  boot,,  bio freeze    Effect of Pain on Daily Activities  tried to avoid getting on floor,  wakes at night with rolling,  hard to get comfortable    Multiple Pain Sites  -- hip, knee, heel on right                         [facility] Adult PT Treatment/Exercise - 11/09/17 0001      Cryotherapy   Number Minutes Cryotherapy  10 Minutes    Cryotherapy Location  Ankle    Type of Cryotherapy  -- cold pack      Manual Therapy   Manual therapy comments  fibula A/P glides and a/P glides with movement into DF increased ROM noted Less pain.      Ankle Exercises: Standing   SLS  3 reps 15+ seconds    Rocker Board  1 minute no soreness    Rocker Board Limitations  no hands    Heel Raises  10 reps sore    Toe Raise  10 reps sore    Other Standing Ankle Exercises  wall slides facing wall 10 X 2 sets single leg      Ankle Exercises: Seated   Other Seated Ankle Exercises  red band IV/EV 10 x each  red band milld soreness lateral leg Also:  sit to stand 10 X cued for  equal weight shift.     Other Seated Ankle Exercises  Pro stretch_ stretch  5 minutes      Ankle Exercises: Supine   T-Band  DF/PF red 10 x each             PT Education - 11/09/17 1123    Education provided  Yes    Education Details  HEP,  anatomy    Person(s) Educated  Patient    Methods  Explanation;Demonstration;Tactile cues;Verbal cues;Handout    Comprehension  Verbalized understanding;Returned demonstration       PT Short Term Goals - 10/21/17 1632      PT SHORT TERM GOAL #1   Title  STG=LTG        PT Long Term Goals - 10/21/17 1633      PT LONG TERM GOAL #1   Title  Pt will be independent with advanced HEP    Time  4    Period  Weeks    Status  New    Target Date  11/18/17      PT LONG TERM GOAL #2   Title  Pt will be able to rise from the floor without exacerbating pain in ankle in order to care for 65 month old son    Time  4    Period  Weeks    Status  New    Target Date  11/18/17      PT LONG TERM GOAL #3   Title  "FOTO will improve from 60% limitation   to  36% limitation   indicating improved functional mobility.         Time  4    Period  Weeks    Status  New    Target Date   11/18/17      PT LONG TERM GOAL #4   Title  Pt will be able to demonstrate increased ankle strength by performing 25 heel raises wearing tennis shoes and to return to [employer] for exercise    Baseline  unable to perform heel raise at evaluation    Time  4    Period  Weeks    Status  New    Target Date  11/18/17      PT LONG TERM GOAL #5   Title  Pt will be able to ambulate over level surfaces with street shoes and no antalgic gait.    Time  4    Period  Weeks    Status  New    Target Date  11/18/17      PT LONG TERM GOAL #6   Title  Pt will be able to negotiate steps with alternating pattern and minimal use of UE's wearing street shoes    Baseline  Pt must negotiate steps one at a time when not wearing boot    Time  4    Period  Weeks    Status  New    Target Date  11/18/17            Plan - 11/09/17 1124    Clinical Impression Statement  Pain increased to 4/10 at end of session due to new exercises.  Less pain with DFG noted with posterior glides of tibia.  She was able to wear shoes today and plans to return to boot after session.  She will see the MD later this morning.      PT Next Visit Plan  Review HEP for standing ankle and gait training.  consider taping  fibula ,  step ups with neutral foot    PT Home Exercise Plan  t band DF, PF, EV and IN  standing SLS,  heel lifts,  toe lifts    Consulted and Agree with Plan of Care  Patient       Patient will benefit from skilled therapeutic intervention in order to improve the following deficits and impairments:     Visit Diagnosis: Acute left ankle pain  Stiffness of left ankle, not elsewhere classified  Difficulty in walking, not elsewhere classified  Muscle weakness (generalized)     Problem List Patient Active Problem List   Diagnosis Date Noted  . PROM (premature rupture of membranes) 03/08/2017  . [redacted] weeks gestation of pregnancy   . Family history of fragile X syndrome 03/25/2016  . History of acute  bronchitis with bronchospasm 04/26/2012  . MTHFR mutation (HCC) 04/01/2012  . 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 Dependent edema 01/18/2012  . Leg pain, bilateral 01/18/2012    Kristina Stewart PTA 11/09/2017, 11:28 AM  [facility] [address] [place], [place], [address] Phone: [phone]   Fax:  [phone]  Name: Kristina Stewart MRN: [id number] Date of Birth: 06-21-1986

## 2017-11-11 ENCOUNTER — Ambulatory Visit: Payer: PRIVATE HEALTH INSURANCE | Admitting: Physical Therapy

## 2017-11-11 ENCOUNTER — Encounter: Payer: Self-pay | Admitting: Physical Therapy

## 2017-11-11 DIAGNOSIS — R262 Difficulty in walking, not elsewhere classified: Secondary | ICD-10-CM

## 2017-11-11 DIAGNOSIS — M25572 Pain in left ankle and joints of left foot: Secondary | ICD-10-CM | POA: Diagnosis not present

## 2017-11-11 DIAGNOSIS — M25672 Stiffness of left ankle, not elsewhere classified: Secondary | ICD-10-CM

## 2017-11-11 DIAGNOSIS — M6281 Muscle weakness (generalized): Secondary | ICD-10-CM

## 2017-11-11 NOTE — Therapy (Signed)
[facility] Outpatient Rehabilitation [facility] [address] [place], [place], [address] Phone: [phone]   Fax:  [phone]  Physical Therapy Treatment  Patient Details  Name: Kristina Stewart MRN: [id number] Date of Birth: 10-09-1985 Referring Provider: Worthy Rancher MD   Encounter Date: 11/11/2017  PT End of Session - 11/11/17 0950    Visit Number  3    Number of Visits  8    Date for PT Re-Evaluation  11/18/17    Authorization Type  [facility] Workman's Compensation    PT Start Time  [phone]    PT Stop Time  0945    PT Time Calculation (min)  56 min    Activity Tolerance  Patient tolerated treatment well    Behavior During Therapy  [facility] for tasks assessed/performed       Past Medical History:  Diagnosis Date  . Allergy   . Asthma   . IBS (irritable bowel syndrome)   . Obesity   . Seasonal allergic rhinitis   . Strep pharyngitis   . TMJ (temporomandibular joint syndrome)     Past Surgical History:  Procedure Laterality Date  . BRAIN SURGERY     craniotomy/arnold chiari malformation  . BRAIN SURGERY     craniotomy/arnold chiari malformation    There were no vitals filed for this visit.  Subjective Assessment - 11/11/17 0851    Subjective  Pain up to 7/10 with work , Now is 3/10  Pt must wear soft brace and boot    Pertinent History  fragile X syndrome, Asthma    How long can you sit comfortably?  unlimited    How long can you stand comfortably?  30 minutes    How long can you walk comfortably?  limited by boot wearing and uneven gait    Patient Stated Goals  be able to care for my 13 month old. and return to [employer] and work full time standing    Currently in Pain?  Yes    Pain Score  3     Pain Location  Ankle    Pain Orientation  Right;Anterior;Lateral    Pain Descriptors / Indicators  Aching;Sore    Pain Frequency  Intermittent                       [facility] Adult PT Treatment/Exercise - 11/11/17 0853      Ambulation/Gait   Gait Comments  must wear boot and soft brace at alll times      Self-Care   Self-Care  Other Self-Care Comments    Other Self-Care Comments   explanation of taping, mulligan for pulling laterally and superiorly      Cryotherapy   Number Minutes Cryotherapy  12 Minutes    Cryotherapy Location  Ankle    Type of Cryotherapy  Ice pack      Manual Therapy   Manual Therapy  Taping    Manual therapy comments  fibula A/P glides and a/P glides with movement into DF increased ROM noted Less pain. with lateral sup pull    Mulligan  lateral sup pull of lateral left malleolus with leuko tape and pre tape      Ankle Exercises: Standing   SLS  3 minutes working with SLS on left on ther ex pad working on Science writer Board  1 minute pain on lateral calcaneus    Heel Raises  10 reps sore and clicking    Toe Raise  10 reps sore    Other Standing Ankle Exercises  attmepted self mobiilzation with blue t band and  mini lunge with left leg forward,  Pt with pain and could not tolerate more than 5       Ankle Exercises: Seated   Other Seated Ankle Exercises  red band IV/EV 10 x each  green band  lateral leg, 10 x ham curls and 10 x extension Also:  sit to stand 10 X cued for equal weight shift.     Other Seated Ankle Exercises  Pro stretch_ stretch  5 minutes pain with Dorsiflexion today               PT Short Term Goals - 11/11/17 0947      PT SHORT TERM GOAL #1   Title  STG=LTG        PT Long Term Goals - 11/11/17 0948      PT LONG TERM GOAL #1   Title  Pt will be independent with advanced HEP    Baseline  Pt given initial HEP    Time  4    Period  Weeks    Status  On-going      PT LONG TERM GOAL #2   Title  Pt will be able to rise from the floor without exacerbating pain in ankle in order to care for 74 month old son    Baseline  Pt with 7/10 pain after working all day    Time  4    Period  Weeks    Status  On-going      PT LONG TERM GOAL #3    Title  "FOTO will improve from 60% limitation   to  36% limitation   indicating improved functional mobility.         Time  4    Period  Weeks    Status  Unable to assess      PT LONG TERM GOAL #4   Title  Pt will be able to demonstrate increased ankle strength by performing 25 heel raises wearing tennis shoes and to return to [employer] for exercise    Baseline  Pt working on 10 heel raises with pain about 3/10    Time  4    Period  Weeks    Status  On-going      PT LONG TERM GOAL #5   Title  Pt will be able to ambulate over level surfaces with street shoes and no antalgic gait.    Baseline  MD wants pt in shoe and soft brace until next MD visit    Time  4    Period  Weeks    Status  On-going      PT LONG TERM GOAL #6   Title  Pt will be able to negotiate steps with alternating pattern and minimal use of UE's wearing street shoes    Time  4    Period  Weeks    Status  Unable to assess            Plan - 11/11/17 0931    Clinical Impression Statement  Pt entered with 3/10 but has a 7/10 at end of the day working.  Pt performed neuro re ed exercises and ther ex and utilized Mulligan technique with lateral/ sup pull of lateral malleolus with leuko tape.  Pt had 0/10 pain sitting and 2/10 pain at end with walking   Will continue with progression of goals  Rehab Potential  Excellent    PT Frequency  2x / week    PT Duration  4 weeks    PT Treatment/Interventions  ADLs/Self Care Home Management;Cryotherapy;Electrical Stimulation;Iontophoresis /ml Dexamethasone;Moist Heat;Ultrasound;Therapeutic exercise;Therapeutic activities;Functional mobility training;Stair training;Gait training;Neuromuscular re-education;Patient/family education;Manual techniques;Passive range of motion;Taping    PT Next Visit Plan  Review HEP for standing ankle and gait training.  teach taping if effective for pain relief standing at work work on step ups    PT Home Exercise Plan  t band DF, PF, EV and IN   standing SLS,  heel lifts,  toe lifts       Patient will benefit from skilled therapeutic intervention in order to improve the following deficits and impairments:  Abnormal gait, Decreased range of motion, Decreased mobility, Pain, Difficulty walking, Decreased strength  Visit Diagnosis: Acute left ankle pain  Stiffness of left ankle, not elsewhere classified  Difficulty in walking, not elsewhere classified  Muscle weakness (generalized)     Problem List Patient Active Problem List   Diagnosis Date Noted  . PROM (premature rupture of membranes) 03/08/2017  . [redacted] weeks gestation of pregnancy   . Family history of fragile X syndrome 03/25/2016  . History of acute bronchitis with bronchospasm 04/26/2012  . MTHFR mutation (HCC) 04/01/2012  . 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 Dependent edema 01/18/2012  . Leg pain, bilateral 01/18/2012    Garen Lah, PT Certified Exercise Expert for the Aging Adult  11/11/17 9:51 AM Phone: [phone] Fax: [phone]  [facility] Outpatient Rehabilitation [facility] [address] [place], [place], [address] Phone: [phone]   Fax:  [phone]  Name: Kristina Stewart MRN: [id number] Date of Birth: 1985-09-18

## 2017-11-15 ENCOUNTER — Encounter: Payer: Self-pay | Admitting: Physical Therapy

## 2017-11-15 ENCOUNTER — Ambulatory Visit: Payer: PRIVATE HEALTH INSURANCE | Admitting: Physical Therapy

## 2017-11-15 DIAGNOSIS — M25572 Pain in left ankle and joints of left foot: Secondary | ICD-10-CM | POA: Diagnosis not present

## 2017-11-15 DIAGNOSIS — M6281 Muscle weakness (generalized): Secondary | ICD-10-CM

## 2017-11-15 DIAGNOSIS — R262 Difficulty in walking, not elsewhere classified: Secondary | ICD-10-CM

## 2017-11-15 DIAGNOSIS — M25672 Stiffness of left ankle, not elsewhere classified: Secondary | ICD-10-CM

## 2017-11-15 NOTE — Therapy (Signed)
[facility] Outpatient Rehabilitation [facility] [address] [place], [place], [address] Phone: [phone]   Fax:  [phone]  Physical Therapy Treatment  Patient Details  Name: Kristina Stewart MRN: [id number] Date of Birth: 12-Oct-1985 Referring Provider: Worthy Rancher MD   Encounter Date: 11/15/2017  PT End of Session - 11/15/17 1109    Visit Number  4    Number of Visits  8    Date for PT Re-Evaluation  11/18/17    PT Start Time  0849    PT Stop Time  0945    PT Time Calculation (min)  56 min    Activity Tolerance  Patient tolerated treatment well    Behavior During Therapy  [facility] for tasks assessed/performed       Past Medical History:  Diagnosis Date  . Allergy   . Asthma   . IBS (irritable bowel syndrome)   . Obesity   . Seasonal allergic rhinitis   . Strep pharyngitis   . TMJ (temporomandibular joint syndrome)     Past Surgical History:  Procedure Laterality Date  . BRAIN SURGERY     craniotomy/arnold chiari malformation  . BRAIN SURGERY     craniotomy/arnold chiari malformation    There were no vitals filed for this visit.  Subjective Assessment - 11/15/17 0856    Subjective  tape really helps    Currently in Pain?  Yes    Pain Score  5  up to 8/10    Pain Orientation  Right;Anterior;Lateral    Pain Descriptors / Indicators  Aching    Aggravating Factors   at the end of a busy day    Pain Relieving Factors  rest boot biofreeze.  tape    Effect of Pain on Daily Activities  rolling over it wakes her up.   takes time to get comfortable.    pillows    Multiple Pain Sites  -- right hip , knee and heel on right with walking.                        [facility] Adult PT Treatment/Exercise - 11/15/17 0001      Knee/Hip Exercises: Standing   Side Lunges Limitations  minisquat with side steps 5 sets of 2 steps,  cued,  small squats tolerated with no pain    Lateral Step Up  Left;1 set;10 reps;Hand Hold: 1;Step Height: 6"     Lateral Step Up Limitations  no pain    Forward Step Up  Left;1 set;10 reps;Hand Hold: 1;Step Height: 6" proper LE position monitored , ed on importance.    Forward Step Up Limitations  no pain    Step Down  Left;1 set;Hand Hold: 1;Step Height: 4"    Step Down Limitations  extra time hesitant    Other Standing Knee Exercises  practice standing without knees unlocked.       Knee/Hip Exercises: Seated   Sit to Sand  10 reps      Cryotherapy   Number Minutes Cryotherapy  10 Minutes    Cryotherapy Location  Ankle    Type of Cryotherapy  -- cold pack      Manual Therapy   Manual Therapy  Taping    Manual therapy comments  fibula A/P glides and a/P glides with movement into DF increased ROM noted Less pain. with lateral sup pull    Mulligan  lateral sup pull of lateral left malleolus with leuko tape and pre tape moderate cues,  patient does best watching then practice      Ankle Exercises: Standing   SLS  practice    Toe Raise  10 reps both             PT Education - 11/15/17 1109    Education provided  Yes    Education Details  how to tape .  LE position needed to decrease stress on foot. How tape works.    Person(s) Educated  Patient    Methods  Explanation;Demonstration;Tactile cues;Verbal cues    Comprehension  Returned demonstration;Verbalized understanding;Need further instruction       PT Short Term Goals - 11/11/17 0947      PT SHORT TERM GOAL #1   Title  STG=LTG        PT Long Term Goals - 11/11/17 0948      PT LONG TERM GOAL #1   Title  Pt will be independent with advanced HEP    Baseline  Pt given initial HEP    Time  4    Period  Weeks    Status  On-going      PT LONG TERM GOAL #2   Title  Pt will be able to rise from the floor without exacerbating pain in ankle in order to care for 40 month old son    Baseline  Pt with 7/10 pain after working all day    Time  4    Period  Weeks    Status  On-going      PT LONG TERM GOAL #3   Title  "FOTO will  improve from 60% limitation   to  36% limitation   indicating improved functional mobility.         Time  4    Period  Weeks    Status  Unable to assess      PT LONG TERM GOAL #4   Title  Pt will be able to demonstrate increased ankle strength by performing 25 heel raises wearing tennis shoes and to return to [employer] for exercise    Baseline  Pt working on 10 heel raises with pain about 3/10    Time  4    Period  Weeks    Status  On-going      PT LONG TERM GOAL #5   Title  Pt will be able to ambulate over level surfaces with street shoes and no antalgic gait.    Baseline  MD wants pt in shoe and soft brace until next MD visit    Time  4    Period  Weeks    Status  On-going      PT LONG TERM GOAL #6   Title  Pt will be able to negotiate steps with alternating pattern and minimal use of UE's wearing street shoes    Time  4    Period  Weeks    Status  Unable to assess            Plan - 11/15/17 1110    Clinical Impression Statement  2/10 at end of session prior to cold pack. Patient was able to tolerate step downs without pain with tape and shoe on a 4 inch step.  She needs moderate cues with taping,  hard to reach.  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 She was discouraged from standing with knees locked in extension,  patient was able to correct with cues.    PT Next  Visit Plan  Review HEP for standing ankle and gait training. Continue to  teach taping for patient/ husband as needed since it helps. Work on step ups and other closed kinetic chain with focus on foot, knee and hip positions.      PT Home Exercise Plan  t band DF, PF, EV and IN  standing SLS,  heel lifts,  toe lifts.  Practice standing walking with good leg position.     Consulted and Agree with Plan of Care  Patient       Patient will benefit from skilled therapeutic intervention in order to improve the following deficits and impairments:     Visit Diagnosis: Acute left ankle  pain  Stiffness of left ankle, not elsewhere classified  Difficulty in walking, not elsewhere classified  Muscle weakness (generalized)     Problem List Patient Active Problem List   Diagnosis Date Noted  . PROM (premature rupture of membranes) 03/08/2017  . [redacted] weeks gestation of pregnancy   . Family history of fragile X syndrome 03/25/2016  . History of acute bronchitis with bronchospasm 04/26/2012  . MTHFR mutation (HCC) 04/01/2012  . 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 Dependent edema 01/18/2012  . Leg pain, bilateral 01/18/2012    HARRIS,KAREN PTA 11/15/2017, 11:20 AM  [facility] [address] [place], [place], [address] Phone: [phone]   Fax:  [phone]  Name: Kristina Stewart MRN: [id number] Date of Birth: Oct 20, 1985

## 2017-11-16 ENCOUNTER — Ambulatory Visit: Payer: PRIVATE HEALTH INSURANCE | Admitting: Physical Therapy

## 2017-11-16 ENCOUNTER — Encounter: Payer: Self-pay | Admitting: Physical Therapy

## 2017-11-16 DIAGNOSIS — M25572 Pain in left ankle and joints of left foot: Secondary | ICD-10-CM | POA: Diagnosis not present

## 2017-11-16 DIAGNOSIS — M25672 Stiffness of left ankle, not elsewhere classified: Secondary | ICD-10-CM

## 2017-11-16 DIAGNOSIS — M6281 Muscle weakness (generalized): Secondary | ICD-10-CM

## 2017-11-16 DIAGNOSIS — R262 Difficulty in walking, not elsewhere classified: Secondary | ICD-10-CM

## 2017-11-16 NOTE — Patient Instructions (Signed)
  Take blue T band and tie to solid surface under heavy chair, hook around left ankle and bend of ankle dorsiflexion , bend forward knee toward cabinet (like a gastroc stretch) forward and to right and to left  10  Each to self mobiize left    Kristina Stewart, PT Certified Exercise Expert for the Aging Adult  11/16/17 9:05 AM Phone: [phone] Fax: [phone]

## 2017-11-16 NOTE — Therapy (Signed)
[place], [place], [address] Phone: [phone]   Fax:  [phone]  Physical Therapy Treatment  Patient Details  Name: Kristina Stewart MRN: [id number] Date of Birth: [birth year] Referring Provider: Lindwood Qua MD   Encounter Date: 11/16/2017  PT End of Session - 11/16/17 0920    Visit Number  5    Number of Visits  8    Date for PT Re-Evaluation  11/18/17    Authorization Type  [facility] Workman's Compensation    PT Start Time  0845    PT Stop Time  0940    PT Time Calculation (min)  55 min    Activity Tolerance  Patient tolerated treatment well    Behavior During Therapy  [facility] for tasks assessed/performed       Past Medical History:  Diagnosis Date  . Allergy   . Asthma   . IBS (irritable bowel syndrome)   . Obesity   . Seasonal allergic rhinitis   . Strep pharyngitis   . TMJ (temporomandibular joint syndrome)     Past Surgical History:  Procedure Laterality Date  . BRAIN SURGERY     craniotomy/arnold chiari malformation  . BRAIN SURGERY     craniotomy/arnold chiari malformation    There were no vitals filed for this visit.  Subjective Assessment - 11/16/17 0851    Subjective  I liked the tape  My husband is coming to learn how next week.  My ankle 8/10 over the weekend  Today it is a 2/10  Not wearing the boot but the soft brace and tennis shoe.      Pertinent History  fragile X syndrome, Asthma    How long can you sit comfortably?  unlimited    Patient Stated Goals  be able to care for my 87 month old. and return to [employer] and work full time standing    Currently in Pain?  Yes    Pain Score  2     Pain Location  Ankle    Pain Orientation  Anterior;Lateral;Left    Pain Descriptors / Indicators  Aching    Pain Type  Chronic pain    Pain Onset  More than a month ago    Pain Frequency  Intermittent                       [facility] Adult PT Treatment/Exercise  - 11/16/17 9485      Ambulation/Gait   Gait Comments  Pt has DC boot herself due to pain and only using soft brace      Knee/Hip Exercises: Standing   Side Lunges Limitations  minisquat 10 x 6 foot at counter    Lateral Step Up  Left;1 set;10 reps;Step Height: 6";Hand Hold: 2    Lateral Step Up Limitations  no pain    Forward Step Up  Left;1 set;10 reps;Step Height: 6";Hand Hold: 2 proper LE position monitored , ed on importance.    Forward Step Up Limitations  no pain    Step Down  Step Height: 6"    Step Down Limitations  1/2 way with no pain     SLS with Vectors  standing on left leg with ankle clock on level surface 2 minutes and then with ther ex for 2 minutes with fatigue    Other Standing Knee Exercises  use of blue t band at left ankle crease and performing gastroc stretch with blue tband forward  and to left and to right for self mobs.      Knee/Hip Exercises: Seated   Sit to Sand  10 reps      Cryotherapy   Number Minutes Cryotherapy  12 Minutes    Cryotherapy Location  Ankle    Type of Cryotherapy  Ice pack      Manual Therapy   Manual Therapy  Taping    Manual therapy comments  fibula A/P glides and a/P glides with movement into DF increased ROM noted Less pain. with lateral sup pull    Mulligan  lateral sup pull of lateral left malleolus with leuko tape and pre tape moderate cues,  patient does best watching then practice             PT Education - 11/16/17 0916    Education provided  Yes    Education Details  Proprioception and self mob to HEP and reinforced HEP     Person(s) Educated  Patient    Methods  Explanation;Demonstration;Tactile cues;Verbal cues;Handout    Comprehension  Verbalized understanding;Returned demonstration       PT Short Term Goals - 11/16/17 0917      PT SHORT TERM GOAL #1   Title  STG=LTG        PT Long Term Goals - 11/16/17 1884      PT LONG TERM GOAL #1   Title  Pt will be independent with advanced HEP    Baseline  Pt  independent with initial HEP    Time  4    Period  Weeks    Status  Partially Met      PT LONG TERM GOAL #2   Title  Pt will be able to rise from the floor without exacerbating pain in ankle in order to care for 63 month old son    Baseline   demos how she bathes baby making sure left hip in half kneeling at 90 90, Pain 5/10 when demoing    Time  4    Period  Weeks    Status  On-going      PT LONG TERM GOAL #3   Title  "FOTO will improve from 60% limitation   to  36% limitation   indicating improved functional mobility.         Time  4    Period  Weeks    Status  Unable to assess      PT LONG TERM GOAL #4   Title  Pt will be able to demonstrate increased ankle strength by performing 25 heel raises wearing tennis shoes and to return to [employer] for exercise    Baseline  Pt able to perform 25 heel raises while wearing tennis shoes and soft brace    Time  4    Period  Weeks    Status  Partially Met      PT LONG TERM GOAL #5   Title  Pt will be able to ambulate over level surfaces with street shoes and no antalgic gait.    Baseline  MD wants pt in shoe and soft brace until next MD visit and pt is working all day standing and sitting up and down    Time  4    Period  Weeks    Status  On-going      PT LONG TERM GOAL #6   Title  Pt will be able to negotiate steps with alternating pattern and minimal use of UE's wearing street shoes  Time  4    Period  Weeks    Status  Unable to assess            Plan - 11/16/17 1051    Clinical Impression Statement  2/10 pain today.  Pt reported 8/10 pain over the weekend. Pt states taping of ankle lateral malleolus is helpful and would like husband to learn for upcoming trip. Pt is more mindful of standing with soft knees and not hyperextending.  Pt LTG #1 and # 4 partially met.  Pt now able to perform 25 heel raises with tennis shoes and soft brace.  PT is independent with HEP so far but working on proprioception exercises    PT Next  Visit Plan  Continue to  teach taping for patient/ husband as needed since it helps. Work on step ups and other closed kinetic chain with focus on foot, knee and hip positions.      PT Home Exercise Plan  t band DF, PF, EV and IN  standing SLS,  heel lifts,  toe lifts.  Practice standing walking with good leg position.        Patient will benefit from skilled therapeutic intervention in order to improve the following deficits and impairments:     Visit Diagnosis: Acute left ankle pain  Stiffness of left ankle, not elsewhere classified  Difficulty in walking, not elsewhere classified  Muscle weakness (generalized)     Problem List Patient Active Problem List   Diagnosis Date Noted  . PROM (premature rupture of membranes) 03/08/2017  . [redacted] weeks gestation of pregnancy   . Family history of fragile X syndrome 03/25/2016  . History of acute bronchitis with bronchospasm 04/26/2012  . MTHFR mutation ([place]) 04/01/2012  . Homozygous MTHFR mutation C677T ([place]) 01/18/2012  . Dependent edema 01/18/2012  . Leg pain, bilateral 01/18/2012    Voncille Lo, PT Certified Exercise Expert for the Aging Adult  11/16/17 10:58 AM Phone: [phone] Fax: [place] [facility] [address] [place], [place], [address] Phone: [phone]   Fax:  [phone]  Name: Kristina Stewart MRN: [id number] Date of Birth: 1985-11-23

## 2017-11-23 ENCOUNTER — Ambulatory Visit: Payer: PRIVATE HEALTH INSURANCE | Admitting: Physical Therapy

## 2017-11-23 ENCOUNTER — Encounter: Payer: Self-pay | Admitting: Physical Therapy

## 2017-11-23 DIAGNOSIS — M6281 Muscle weakness (generalized): Secondary | ICD-10-CM

## 2017-11-23 DIAGNOSIS — R262 Difficulty in walking, not elsewhere classified: Secondary | ICD-10-CM

## 2017-11-23 DIAGNOSIS — M25572 Pain in left ankle and joints of left foot: Secondary | ICD-10-CM

## 2017-11-23 DIAGNOSIS — M25672 Stiffness of left ankle, not elsewhere classified: Secondary | ICD-10-CM

## 2017-11-23 NOTE — Patient Instructions (Signed)
   HIP: Abduction / External Rotation (Band)   Place band around knees. Lie on side with hips and knees bent. Raise top knee up, squeezing glutes. Keep feet together. Hold _3__ seconds. Use _red_______ band. _15__ reps per set, __2_ sets per day, _1__ days per week  Bridge   Lie back, legs bent. Inhale, pressing hips up. Keeping ribs in, lengthen lower back. Exhale, rolling down along spine from top. Repeat _15 x 2___ times. Do __1__ sessions per day.   Bridging (Single Leg)   Lie on back with feet shoulder width apart and right leg straight. Lift hips toward the ceiling while keeping leg straight. Hold ____ seconds. Repeat ____ times. Do ____ sessions per day.  http://[email or web address].exer.us/358   EXTENSION: Prone - Knee Flexed (Active)   Lie on stomach, right knee bent to 90. Lift leg toward ceiling. Use ___ lbs. Complete ___ sets of ___ repetitions. Perform ___ sessions per day.  http://gtsc.exer.us/66   Hip Extension (Prone)   Lift left leg ____ inches from floor, keeping knee locked. Repeat ____ times per set. Do ____ sets per session. Do ____ sessions per day.  HIP: Abduction - Side Step (Band)   Place band around ankles. Alternating legs: step out, step out, step in, step in. Repeat, stepping out first with other leg. Hold _3__ seconds. Use ______red__ band.  20 fet x 2 at counter.  Hold onto a support.Remember Jianna point feet forward  Copyright  VHI. All rights reserved.   Figure 8 single limb stance for 2-3 minutes on left  Heel raise with tennis ball x 15 x 2  Short foot with red t band under great toe ball of foot .   Garen Lah, PT Certified Exercise Expert for the Aging Adult  11/23/17 9:28 AM Phone: [phone] Fax: [phone]

## 2017-11-23 NOTE — Therapy (Signed)
[place], [place], [address] Phone: [phone]   Fax:  [phone]  Physical Therapy Treatment  Patient Details  Name: Kristina Stewart MRN: [id number] Date of Birth: 1985/08/18 Referring Provider: Lindwood Qua MD   Encounter Date: 11/23/2017  PT End of Session - 11/23/17 1136    Visit Number  6    Number of Visits  8    Date for PT Re-Evaluation  11/18/17    Authorization Type  [facility] Workman's Compensation    PT Start Time  0845    PT Stop Time  0954    PT Time Calculation (min)  69 min    Activity Tolerance  Patient tolerated treatment well    Behavior During Therapy  [facility] for tasks assessed/performed       Past Medical History:  Diagnosis Date  . Allergy   . Asthma   . IBS (irritable bowel syndrome)   . Obesity   . Seasonal allergic rhinitis   . Strep pharyngitis   . TMJ (temporomandibular joint syndrome)     Past Surgical History:  Procedure Laterality Date  . BRAIN SURGERY     craniotomy/arnold chiari malformation  . BRAIN SURGERY     craniotomy/arnold chiari malformation    There were no vitals filed for this visit.  Subjective Assessment - 11/23/17 0851    Subjective  good weekend went to [place]. I taped myself but I dont think it was as good as it could be.  My husband will come on Thursday to learn how went to a [employer] game  Feel it the  most when i go up an down steps    Pertinent History  fragile X syndrome, Asthma    How long can you stand comfortably?  was able to go to a [employer] game stood for 2 hours    How long can you walk comfortably?  walking for 90 minutes to 2 hours    Patient Stated Goals  be able to care for my 71 month old. and return to [employer] and work full time standing    Currently in Pain?  Yes    Pain Score  2     Pain Location  Ankle    Pain Orientation  Anterior;Lateral;Left    Pain Descriptors / Indicators  Aching;Sharp sharp going up and down steps     Pain Type  Chronic pain    Pain Onset  More than a month ago    Pain Frequency  Intermittent                       [facility] Adult PT Treatment/Exercise - 11/23/17 0856      Ambulation/Gait   Gait Comments  still wearing soft brace      Knee/Hip Exercises: Standing   Side Lunges Limitations  minisquat 10 x 6 foot at counter    SLS with Vectors  standing on left leg with ankle clock on level surface 2 minutes and then with ther ex for 2 minutes with fatigue    Other Standing Knee Exercises  monster walk with feet straiight and knees slightly bent with red t band 20 feet     Other Standing Knee Exercises  SLS on left with blue med ball x 2 min figure 8      Knee/Hip Exercises: Supine   Bridges  15 reps;2 sets      Knee/Hip Exercises: Sidelying   Clams  right sidelying with red t band       Cryotherapy   Number Minutes Cryotherapy  12 Minutes    Cryotherapy Location  Ankle    Type of Cryotherapy  Ice pack      Manual Therapy   Manual Therapy  Taping    Manual therapy comments  fibula A/P glides and a/P glides with movement into DF increased ROM noted Less pain. with lateral sup pull    Mulligan  lateral sup pull of lateral left malleolus with leuko tape and pre tape      Ankle Exercises: Aerobic   Elliptical  4 min   stopped due to ankle pain 6/10      Ankle Exercises: Standing   SLS  2 min with blue med ball in figure 8 Left foot   9 loss of balance in 2 min  calf burning    Rocker Board  3 minutes 1/2 range without pain full rangel with pain    Rebounder  3 minutes  can only SLS for 5 rebounds of blue med ball     Heel Walk (Round Trip)  40 ft    Toe Walk (Round Trip)  40 ft    Other Standing Ankle Exercises  bil heel raise with tennis ball at heel x 15    Other Standing Ankle Exercises  heel raise 20 x , red t band " short foot" x 2 min             PT Education - 11/23/17 0928    Education provided  Yes    Education Details  added  to HEP for hip  and ankle strength and proprioception    Person(s) Educated  Patient    Methods  Explanation;Demonstration;Tactile cues;Verbal cues    Comprehension  Verbalized understanding;Returned demonstration       PT Short Term Goals - 11/16/17 0917      PT SHORT TERM GOAL #1   Title  STG=LTG        PT Long Term Goals - 11/16/17 0086      PT LONG TERM GOAL #1   Title  Pt will be independent with advanced HEP    Baseline  Pt independent with initial HEP    Time  4    Period  Weeks    Status  Partially Met      PT LONG TERM GOAL #2   Title  Pt will be able to rise from the floor without exacerbating pain in ankle in order to care for 35 month old son    Baseline   demos how she bathes baby making sure left hip in half kneeling at 90 90, Pain 5/10 when demoing    Time  4    Period  Weeks    Status  On-going      PT LONG TERM GOAL #3   Title  "FOTO will improve from 60% limitation   to  36% limitation   indicating improved functional mobility.         Time  4    Period  Weeks    Status  Unable to assess      PT LONG TERM GOAL #4   Title  Pt will be able to demonstrate increased ankle strength by performing 25 heel raises wearing tennis shoes and to return to [employer] for exercise    Baseline  Pt able to perform 25 heel raises while wearing tennis shoes and soft brace  Time  4    Period  Weeks    Status  Partially Met      PT LONG TERM GOAL #5   Title  Pt will be able to ambulate over level surfaces with street shoes and no antalgic gait.    Baseline  MD wants pt in shoe and soft brace until next MD visit and pt is working all day standing and sitting up and down    Time  4    Period  Weeks    Status  On-going      PT LONG TERM GOAL #6   Title  Pt will be able to negotiate steps with alternating pattern and minimal use of UE's wearing street shoes    Time  4    Period  Weeks    Status  Unable to assess            Plan - 11/23/17 1142    Clinical Impression  Statement  Pt complains of 2/10 pain but had great weekend going to a [employer] game in [place] and stood for 90 min to 2 hours. Pt progressed with strengthening and proprioception exercises today. Will have husband come for taping educatio.  Pt with sharp pain without tape today .     Rehab Potential  Excellent    PT Frequency  2x / week    PT Duration  4 weeks    PT Treatment/Interventions  ADLs/Self Care Home Management;Cryotherapy;Electrical Stimulation;Iontophoresis 93m/ml Dexamethasone;Moist Heat;Ultrasound;Therapeutic exercise;Therapeutic activities;Functional mobility training;Stair training;Gait training;Neuromuscular re-education;Patient/family education;Manual techniques;Passive range of motion;Taping    PT Next Visit Plan  check goals and has 2 more visits approved. Check on HEP I Continue to  teach taping for patient/ husband as needed since it helps. Work on step ups and other closed kinetic chain with focus on foot, knee and hip positions.      PT Home Exercise Plan  t band DF, PF, EV and IN  standing SLS,  heel lifts,  toe lifts.  Practice standing walking with good leg position. sidlying clam, and bridge,      Consulted and Agree with Plan of Care  Patient       Patient will benefit from skilled therapeutic intervention in order to improve the following deficits and impairments:  Abnormal gait, Decreased range of motion, Decreased mobility, Pain, Difficulty walking, Decreased strength  Visit Diagnosis: Acute left ankle pain  Stiffness of left ankle, not elsewhere classified  Difficulty in walking, not elsewhere classified  Muscle weakness (generalized)     Problem List Patient Active Problem List   Diagnosis Date Noted  . PROM (premature rupture of membranes) 03/08/2017  . [redacted] weeks gestation of pregnancy   . Family history of fragile X syndrome 03/25/2016  . History of acute bronchitis with bronchospasm 04/26/2012  . [place] 04/[place]  . 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 Dependent edema 01/18/2012  . Leg pain, bilateral 01/18/2012   LVoncille Lo PT Certified Exercise Expert for the Aging Adult  11/23/17 11:44 AM Phone: [phone]: 3[facility] [address]Phone: [phone]  Fax:  [phone] Name: Kristina PELLUMMRN: [id number] of Birth: [birth year]

## 2017-11-25 ENCOUNTER — Encounter: Payer: Self-pay | Admitting: Physical Therapy

## 2017-11-25 ENCOUNTER — Ambulatory Visit: Payer: PRIVATE HEALTH INSURANCE | Attending: Internal Medicine | Admitting: Physical Therapy

## 2017-11-25 DIAGNOSIS — M6281 Muscle weakness (generalized): Secondary | ICD-10-CM | POA: Diagnosis present

## 2017-11-25 DIAGNOSIS — R262 Difficulty in walking, not elsewhere classified: Secondary | ICD-10-CM | POA: Diagnosis present

## 2017-11-25 DIAGNOSIS — M25572 Pain in left ankle and joints of left foot: Secondary | ICD-10-CM | POA: Insufficient documentation

## 2017-11-25 DIAGNOSIS — M25672 Stiffness of left ankle, not elsewhere classified: Secondary | ICD-10-CM | POA: Insufficient documentation

## 2017-11-25 NOTE — Therapy (Signed)
[place], [place], [address] Phone: [phone]   Fax:  [phone]  Physical Therapy Treatment/ recertification  Patient Details  Name: Kristina Stewart MRN: [id number] Date of Birth: 1986/05/28 Referring Provider: Lindwood Qua MD   Encounter Date: 11/25/2017  PT End of Session - 11/25/17 1600    Visit Number  7    Date for PT Re-Evaluation  11/18/17    Authorization Type  [facility] Workman's Compensation    PT Start Time  7412    PT Stop Time  1536    PT Time Calculation (min)  39 min    Activity Tolerance  Patient tolerated treatment well    Behavior During Therapy  [facility] for tasks assessed/performed       Past Medical History:  Diagnosis Date  . Allergy   . Asthma   . IBS (irritable bowel syndrome)   . Obesity   . Seasonal allergic rhinitis   . Strep pharyngitis   . TMJ (temporomandibular joint syndrome)     Past Surgical History:  Procedure Laterality Date  . BRAIN SURGERY     craniotomy/arnold chiari malformation  . BRAIN SURGERY     craniotomy/arnold chiari malformation    There were no vitals filed for this visit.  Subjective Assessment - 11/25/17 1645    Subjective  I am doing well.  My husband is here to learn how to tape my ankle . It feels so much better when it is taped    Patient is accompained by:  Family member husband    Pertinent History  fragile X syndrome, Asthma    How long can you sit comfortably?  unlimited    How long can you stand comfortably?  able to stand all day for work with a few breaks     How long can you walk comfortably?  walking for 90 minutes to 2 hours    Patient Stated Goals  be able to care for my 51 month old. and return to [employer] and work full time standing    Currently in Pain?  Yes    Pain Score  2     Pain Location  Ankle    Pain Orientation  Anterior;Lateral;Left    Pain Descriptors / Indicators  Aching    Pain Type  Chronic pain     Pain Onset  More than a month ago    Pain Frequency  Intermittent         [facility] PT Assessment - 11/25/17 0001      Observation/Other Assessments   Focus on Therapeutic Outcomes (FOTO)   FOTO intake 55% limitation 45 % and 36 % predicted                   [facility] Adult PT Treatment/Exercise - 11/25/17 1646      Self-Care   Self-Care  Other Self-Care Comments    Other Self-Care Comments   educated husband and wife on taping techinique.  used skeleton to show injury and ligament attachiments and had husband demo understanding for 3 tapingsof pt left ankle      Knee/Hip Exercises: Standing   SLS with Vectors  standing on left leg with ankle clock on level surface 2 minutes and then with ther ex for 2 minutes with fatigue    Other Standing Knee Exercises  left SLS with perturbations on ther ex mat for 3 min    Other Standing Knee Exercises  SLS on  left with blue med ball x 2 min figure 8      Manual Therapy   Mulligan  lateral sup pull of lateral left malleolus with leuko tape and pre tape             PT Education - 11/25/17 1653    Education provided  Yes    Education Details  Pt husband present for education and demo of taping technique for Pt     Person(s) Educated  Patient;Spouse    Methods  Explanation;Demonstration    Comprehension  Verbalized understanding;Returned demonstration       PT Short Term Goals - 11/16/17 0917      PT SHORT TERM GOAL #1   Title  STG=LTG        PT Long Term Goals - 11/25/17 1651      PT LONG TERM GOAL #1   Title  Pt will be independent with advanced HEP    Baseline  Pt independent with HEP as of today. working on proprioceptive exercises and taping    Time  4    Period  Weeks    Status  Partially Met      PT LONG TERM GOAL #2   Title  Pt will be able to rise from the floor without exacerbating pain in ankle in order to care for 64 month old son    Baseline  Pt states she is able to care of 7 month old and change  diapers on the floor    Time  4    Period  Weeks    Status  Achieved      PT LONG TERM GOAL #3   Title  "FOTO will improve from 60% limitation   to  36% limitation   indicating improved functional mobility.         Baseline  limitation 45%    Time  4    Period  Weeks    Status  Partially Met      PT LONG TERM GOAL #4   Title  Pt will be able to demonstrate increased ankle strength by performing 25 heel raises wearing tennis shoes and to return to [employer] for exercise    Baseline  Pt able to perform 25 heel raises while wearing tennis shoes and soft brace but not going to [employer] exercises yet    Time  4    Period  Weeks    Status  Partially Met      PT LONG TERM GOAL #5   Title  Pt will be able to ambulate over level surfaces with street shoes and no antalgic gait.    Baseline  MD wants pt in shoe and soft brace until next MD visit and pt is working all day standing and sitting up and down    Time  4    Period  Weeks    Status  On-going      PT LONG TERM GOAL #6   Title  Pt will be able to negotiate steps with alternating pattern and minimal use of UE's wearing street shoes    Time  4    Period  Weeks    Status  Unable to assess            Plan - 11/25/17 1654    Clinical Impression Statement  Pt has achieved Martin Majestic met all goals except for LTG # 5 to be assessed next visit.  Last visit scheduled for next week. Pt  had husband come to learn how to tape ankle and was able to demo 3 x ability to tape left ankle  with confidence and to alleviate pain in pt. Ms, [name] has one additonal visit to reinforce HEP.  Will continue toward completion of all goals next visit . Pt needs her visits extended for last 3 additional visits   Rehab Potential  Excellent    PT Frequency  2x / week    PT Duration  4 weeks    PT Treatment/Interventions  ADLs/Self Care Home Management;Cryotherapy;Electrical Stimulation;Iontophoresis 51m/ml Dexamethasone;Moist  Heat;Ultrasound;Therapeutic exercise;Therapeutic activities;Functional mobility training;Stair training;Gait training;Neuromuscular re-education;Patient/family education;Manual techniques;Passive range of motion;Taping    PT Next Visit Plan  Review HEP for strength and proprioception and DC next visit    PT Home Exercise Plan  t band DF, PF, EV and IN  standing SLS,  heel lifts,  toe lifts.  Practice standing walking with good leg position. sidlying clam, and bridge,      Consulted and Agree with Plan of Care  Patient       Patient will benefit from skilled therapeutic intervention in order to improve the following deficits and impairments:  Abnormal gait, Decreased range of motion, Decreased mobility, Pain, Difficulty walking, Decreased strength  Visit Diagnosis: Acute left ankle pain  Stiffness of left ankle, not elsewhere classified  Difficulty in walking, not elsewhere classified  Muscle weakness (generalized)     Problem List Patient Active Problem List   Diagnosis Date Noted  . PROM (premature rupture of membranes) 03/08/2017  . [redacted] weeks gestation of pregnancy   . Family history of fragile X syndrome 03/25/2016  . History of acute bronchitis with bronchospasm 04/26/2012  . MTHFR mutation ([place] [employer] 04/01/2012  . Homozygous MTHFR mutation C677T ([place] 01/18/2012  . Dependent edema 01/18/2012  . Leg pain, bilateral 01/18/2012   LVoncille Lo PT Certified Exercise Expert for the Aging Adult  11/25/17 4:59 PM Phone: [phone]: 3[facility] StreetGThe [place] [address]Phone: [phone]  Fax:  [phone] Name: Kristina BETTENDORFMRN: [id number] of Birth: 204-01-1986

## 2017-11-30 ENCOUNTER — Ambulatory Visit: Payer: PRIVATE HEALTH INSURANCE | Attending: Internal Medicine | Admitting: Physical Therapy

## 2017-11-30 DIAGNOSIS — M25672 Stiffness of left ankle, not elsewhere classified: Secondary | ICD-10-CM | POA: Insufficient documentation

## 2017-11-30 DIAGNOSIS — M6281 Muscle weakness (generalized): Secondary | ICD-10-CM | POA: Insufficient documentation

## 2017-11-30 DIAGNOSIS — M25572 Pain in left ankle and joints of left foot: Secondary | ICD-10-CM | POA: Diagnosis present

## 2017-11-30 DIAGNOSIS — R262 Difficulty in walking, not elsewhere classified: Secondary | ICD-10-CM | POA: Diagnosis present

## 2017-11-30 NOTE — Therapy (Addendum)
[place], [place], [address] Phone: [phone]   Fax:  [phone]  Physical Therapy Treatment/Discharge Note  Patient Details  Name: LARAYA PESTKA MRN: [id number] Date of Birth: [birth year] Referring Provider: Lindwood Qua MD   Encounter Date: 11/30/2017  PT End of Session - 11/30/17 1205    Visit Number  8    Number of Visits  8    Date for PT Re-Evaluation  11/18/17    Authorization Type  [facility] Workman's Compensation    PT Start Time  [phone]    PT Stop Time  0930    PT Time Calculation (min)  44 min    Activity Tolerance  Patient tolerated treatment well    Behavior During Therapy  [facility] for tasks assessed/performed       Past Medical History:  Diagnosis Date  . Allergy   . Asthma   . IBS (irritable bowel syndrome)   . Obesity   . Seasonal allergic rhinitis   . Strep pharyngitis   . TMJ (temporomandibular joint syndrome)     Past Surgical History:  Procedure Laterality Date  . BRAIN SURGERY     craniotomy/arnold chiari malformation  . BRAIN SURGERY     craniotomy/arnold chiari malformation    There were no vitals filed for this visit.  Subjective Assessment - 11/30/17 0852    Subjective  I am doing well.  I am mindful of how I utilize my leg.  I am going to return to [employer] for exercises.  I still have my taping on my ankle.     Pertinent History  fragile X syndrome, Asthma    How long can you sit comfortably?  unlimited    How long can you stand comfortably?  able to stand all day for work with a few breaks     How long can you walk comfortably?  walking for 90 minutes to 2 hours    Patient Stated Goals  be able to care for my 21 month old. and return to [employer] and work full time standing    Currently in Pain?  Yes    Pain Score  1     Pain Location  Ankle    Pain Orientation  Anterior    Pain Descriptors / Indicators  Aching    Pain Type  Chronic pain          [facility] PT Assessment - 11/30/17 0901      AROM   Overall AROM   Deficits    Right Ankle Dorsiflexion  13    Right Ankle Plantar Flexion  64    Right Ankle Inversion  49    Right Ankle Eversion  30    Left Ankle Dorsiflexion  12    Left Ankle Plantar Flexion  43    Left Ankle Inversion  45    Left Ankle Eversion  25      Strength   Overall Strength  Deficits    Overall Strength Comments  Pt unable to heel raise on left foot    Right Hip Flexion  5/5    Right Hip Extension  5/5    Right Hip ABduction  5/5    Left Hip Flexion  5/5    Left Hip Extension  5/5    Left Hip ABduction  5/5    Right Knee Flexion  5/5    Right Knee Extension  5/5    Left Knee Flexion  5/5    Left Knee Extension  5/5    Right Ankle Dorsiflexion  5/5    Right Ankle Plantar Flexion  5/5    Left Ankle Dorsiflexion  4-/5    Left Ankle Plantar Flexion  4/5      Ambulation/Gait   Gait Pattern  Within Functional Limits no limping    Stairs  Yes    Stairs Assistance  7: Independent    Stair Management Technique  No rails    Number of Stairs  32 up and down 4 steps x 4    Gait Comments  I am not wearing the brace in the home but always in public and uneven ground                   [facility] Adult PT Treatment/Exercise - 11/30/17 0901      Self-Care   Self-Care  Other Self-Care Comments    Other Self-Care Comments   educated on hiking / precautians, community wellness       Knee/Hip Exercises: Standing   Side Lunges Limitations  minisquat 10 x 6 foot at counter    Lateral Step Up  Left;15 reps    Lateral Step Up Limitations  no pain    Forward Step Up  Left;15 reps    Forward Step Up Limitations  no pain    Step Down  Step Height: 6"    Step Down Limitations  1/2 way with no pain     SLS with Vectors  standing on left leg with ankle clock on level surface 2 minutes and then with ther ex pad  for 2 minutes with fatigue    Other Standing Knee Exercises  SLS on left with blue med ball x  2 min figure 8 and with 10 lb KB switching in left and right hand for 3 minutes      Knee/Hip Exercises: Seated   Sit to Sand  2 sets;15 reps goblet squats      Knee/Hip Exercises: Supine   Bridges  15 reps;2 sets      Knee/Hip Exercises: Sidelying   Clams  right sidelying with red t band       Manual Therapy   Manual Therapy  Taping    Manual therapy comments  fibula A/P glides and a/P glides with movement into DF increased ROM noted Less pain. with lateral sup pull    Mulligan  lateral sup pull of lateral left malleolus with leuko tape and pre tape      Ankle Exercises: Standing   Heel Walk (Round Trip)  40 ft    Toe Walk (Round Trip)  40 ft    Other Standing Ankle Exercises  bil heel raise to 25 x , left 10 x with calf burning.             PT Education - 11/30/17 [phone]    Education provided  Yes    Education Details  Reviewed HEP/ proprioception and also community wellness and hiking/ leasure activities    Person(s) Educated  Patient    Methods  Explanation;Demonstration    Comprehension  Verbalized understanding;Returned demonstration       PT Short Term Goals - 11/16/17 0917      PT SHORT TERM GOAL #1   Title  STG=LTG        PT Long Term Goals - 11/30/17 0904      PT LONG TERM GOAL #1   Title  Pt will be independent  with advanced HEP    Baseline  Pt independent with HEP as of today.  husband in [place] taping wife for daily activity.     Time  4    Period  Weeks    Status  Achieved      PT LONG TERM GOAL #2   Title  Pt will be able to rise from the floor without exacerbating pain in ankle in order to care for 60 month old son    Baseline  Pt is able to carefully rise from the ground to change and bathe 41 month old baby    Time  4    Period  Weeks    Status  Achieved      PT LONG TERM GOAL #3   Title  "FOTO will improve from 60% limitation   to  36% limitation   indicating improved functional mobility.         Baseline  limitation 45%    Time  4    Period   Weeks    Status  Partially Met      PT LONG TERM GOAL #4   Title  Pt will be able to demonstrate increased ankle strength by performing 25 heel raises wearing tennis shoes and to return to [employer] for exercise    Baseline  Pt able to perform 25 heel raises bil  while wearing tennis shoes and soft brace but not going to [employer] exercises yet. cramps when having 11 SLS on left    Time  4    Period  Weeks    Status  Partially Met      PT LONG TERM GOAL #5   Title  Pt will be able to ambulate over level surfaces with street shoes and no antalgic gait.    Baseline  Wearing tennis shoes and able to ambulate without antalgic gait.     Time  4    Period  Weeks    Status  Achieved      PT LONG TERM GOAL #6   Title  Pt will be able to negotiate steps with alternating pattern and minimal use of UE's wearing street shoes    Baseline  Pt able to negotiate step over step for up and down 4 steps x 4 normal gait    Time  4    Status  Achieved            Plan - 11/30/17 1201    Clinical Impression Statement  Pt has met or partially met all long term goals. Pt pain in 1/10 and pt husband is independent in taping of left ankle for comfort.  Ms Alvidrez has been an exemplary pt and is able to work full time and is planning to return to [employer] for community fitness.  Pt is able to heel raise bil 25 x in normal range but has burning in calves at 11 times on single limb on leftdue to weakness. but is much improved from evaluation.  Pt is independent with HEP and shoulde continue to make improvement with community fitness program.  Pt has been a joy with whom to work.     Rehab Potential  Excellent    PT Frequency  2x / week    PT Duration  4 weeks    PT Treatment/Interventions  ADLs/Self Care Home Management;Cryotherapy;Electrical Stimulation;Iontophoresis 3m/ml Dexamethasone;Moist Heat;Ultrasound;Therapeutic exercise;Therapeutic activities;Functional mobility training;Stair training;Gait  training;Neuromuscular re-education;Patient/family education;Manual techniques;Passive range of motion;Taping    PT Next Visit  Plan  DC    PT Home Exercise Plan  t band DF, PF, EV and IN  standing SLS,  heel lifts,  toe lifts.  Practice standing walking with good leg position. sidlying clam, and bridge,         Patient will benefit from skilled therapeutic intervention in order to improve the following deficits and impairments:  Abnormal gait, Decreased range of motion, Decreased mobility, Pain, Difficulty walking, Decreased strength  Visit Diagnosis: Acute left ankle pain  Stiffness of left ankle, not elsewhere classified  Difficulty in walking, not elsewhere classified  Muscle weakness (generalized)     Problem List Patient Active Problem List   Diagnosis Date Noted  . PROM (premature rupture of membranes) 03/08/2017  . [redacted] weeks gestation of pregnancy   . Family history of fragile X syndrome 03/25/2016  . History of acute bronchitis with bronchospasm 04/26/2012  . MTHFR mutation ([place]) 04/01/2012  . 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) 01/18/2012  . Dependent edema 01/18/2012  . Leg pain, bilateral 01/18/2012    Voncille Lo, PT Certified Exercise Expert for the Aging Adult  11/30/17 12:07 PM Phone: [phone] Fax: [facility] [address] [place], [place], [address] Phone: [phone]   Fax:  [phone]  Name: SUANNE MINAHAN MRN: [id number] Date of Birth: 1985-11-07   PHYSICAL THERAPY DISCHARGE SUMMARY  Visits from Start of Care: 8  Current functional level related to goals / functional outcomes: As above  1/10 pain   Remaining deficits: 1/10 pain,     Education / Equipment: HEP Plan: Patient agrees to discharge.  Patient goals were partially met. Patient is being discharged due to being pleased with the current functional level.  ?????    Pt is independent with HEP and is  going to pursue communitiy wellness  Voncille Lo, PT Certified Exercise Expert for the Aging Adult  11/30/17 12:10 PM Phone: [phone] Fax: [phone]

## 2017-12-02 ENCOUNTER — Encounter: Payer: Self-pay | Admitting: Physical Therapy

## 2017-12-19 ENCOUNTER — Telehealth: Payer: No Typology Code available for payment source | Admitting: Family

## 2017-12-19 DIAGNOSIS — R399 Unspecified symptoms and signs involving the genitourinary system: Secondary | ICD-10-CM

## 2017-12-19 MED ORDER — CEPHALEXIN 500 MG PO CAPS: 500.0000 mg | ORAL_CAPSULE | Freq: Two times a day (BID) | ORAL | 0 refills | Status: DC

## 2017-12-19 NOTE — Progress Notes (Signed)

## 2018-01-05 ENCOUNTER — Telehealth: Payer: No Typology Code available for payment source | Admitting: Nurse Practitioner

## 2018-01-05 DIAGNOSIS — B370 Candidal stomatitis: Secondary | ICD-10-CM | POA: Diagnosis not present

## 2018-01-05 MED ORDER — NYSTATIN 100000 UNIT/ML MT SUSP: 5.0000 mL | Freq: Four times a day (QID) | OROMUCOSAL | 0 refills | Status: DC

## 2018-01-05 MED FILL — NYSTATIN 100,000 UNITS/ML S: 100000 | 3 days supply | Qty: 60 | Fill #0

## 2018-01-05 NOTE — Progress Notes (Signed)
Thank you for allowing us to treat you today. You have thrush. We do nt usually treat this in an evisit but I have sent nystatin for you to swish and swallow 4x a day to your pharmacy. Avoid spicy foods and acidic drinks for at least 3-4 days. If you have any further questions feel free to contact me.

## 2018-02-16 ENCOUNTER — Telehealth: Payer: No Typology Code available for payment source | Admitting: Nurse Practitioner

## 2018-02-16 DIAGNOSIS — B9789 Other viral agents as the cause of diseases classified elsewhere: Secondary | ICD-10-CM | POA: Diagnosis not present

## 2018-02-16 DIAGNOSIS — J329 Chronic sinusitis, unspecified: Secondary | ICD-10-CM | POA: Diagnosis not present

## 2018-02-16 MED ORDER — FLUTICASONE PROPIONATE 50 MCG/ACT NA SUSP: 2.0000 | Freq: Every day | NASAL | 6 refills | Status: DC

## 2018-02-16 MED FILL — FLUTICASONE PROP 50 MCG SPR: 50 | 30 days supply | Qty: 16 | Fill #0

## 2018-02-16 NOTE — Progress Notes (Signed)

## 2018-02-17 ENCOUNTER — Ambulatory Visit (INDEPENDENT_AMBULATORY_CARE_PROVIDER_SITE_OTHER): Payer: No Typology Code available for payment source | Admitting: Internal Medicine

## 2018-02-17 ENCOUNTER — Encounter: Payer: Self-pay | Admitting: Internal Medicine

## 2018-02-17 VITALS — BP 98/70 | HR 96 | Temp 98.3°F | Ht 64.75 in | Wt 228.0 lb

## 2018-02-17 DIAGNOSIS — H6693 Otitis media, unspecified, bilateral: Secondary | ICD-10-CM | POA: Diagnosis not present

## 2018-02-17 DIAGNOSIS — J029 Acute pharyngitis, unspecified: Secondary | ICD-10-CM | POA: Diagnosis not present

## 2018-02-17 LAB — POCT RAPID STREP A (OFFICE): Rapid Strep A Screen: NEGATIVE

## 2018-02-17 MED ORDER — FLUCONAZOLE 150 MG PO TABS: 150.0000 mg | ORAL_TABLET | Freq: Once | ORAL | 0 refills | Status: AC

## 2018-02-17 MED ORDER — CLARITHROMYCIN 500 MG PO TABS: 500.0000 mg | ORAL_TABLET | Freq: Two times a day (BID) | ORAL | 0 refills | Status: DC

## 2018-02-17 MED FILL — FLUCONAZOLE 150 MG TABS: 150 | 1 days supply | Qty: 1 | Fill #0

## 2018-02-17 MED FILL — CLARITHROMYCIN 500 MG TAB: 500 | 10 days supply | Qty: 20 | Fill #0

## 2018-02-17 NOTE — Patient Instructions (Signed)
Biaxin 500 mg bid x 10 days. Continue Mucinex D. Diflucan if needed.

## 2018-02-26 NOTE — Progress Notes (Signed)
   Subjective:    Patient ID: Kristina Stewart, female    DOB: 06-24-1986, 32 y.o.   MRN: [id number]  HPI Patient had an E- visit yesterday and was diagnosed with viral sinusitis.  Was prescribed Mucinex D and Flonase nasal spray.  She is not getting better.  She is concerned.  She has a small infant at home.  She works full-time.    Review of Systems has maxillary sinus tenderness     Objective:   Physical Exam Skin warm and dry.  Nodes none.  Pharynx slightly injected.  TMs are slightly full bilaterally worse on the right than the left.  Neck is supple.  Chest clear.  Rapid strep screen is negative       Assessment & Plan:  Acute maxillary sinusitis  Acute bilateral otitis media  Acute pharyngitis  Plan: Biaxin 500 mg twice daily for 10 days.  Diflucan if needed for Candida vaginitis.  She is not breast-feeding.

## 2018-05-05 ENCOUNTER — Ambulatory Visit: Payer: No Typology Code available for payment source | Admitting: Internal Medicine

## 2018-05-17 ENCOUNTER — Telehealth: Payer: No Typology Code available for payment source | Admitting: Physician Assistant

## 2018-05-17 DIAGNOSIS — R69 Illness, unspecified: Secondary | ICD-10-CM

## 2018-05-17 DIAGNOSIS — J111 Influenza due to unidentified influenza virus with other respiratory manifestations: Secondary | ICD-10-CM

## 2018-05-17 MED ORDER — OSELTAMIVIR PHOSPHATE 75 MG PO CAPS: 75.0000 mg | ORAL_CAPSULE | Freq: Two times a day (BID) | ORAL | 0 refills | Status: DC

## 2018-05-17 NOTE — Progress Notes (Signed)

## 2018-05-24 ENCOUNTER — Telehealth: Payer: No Typology Code available for payment source | Admitting: Family Medicine

## 2018-05-24 DIAGNOSIS — J329 Chronic sinusitis, unspecified: Secondary | ICD-10-CM

## 2018-05-24 MED ORDER — AMOXICILLIN-POT CLAVULANATE 875-125 MG PO TABS: 1.0000 | ORAL_TABLET | Freq: Two times a day (BID) | ORAL | 0 refills | Status: AC

## 2018-05-24 MED FILL — AMOX-CLAV 875-125 MG TABLET: 875-125 | 7 days supply | Qty: 14 | Fill #0

## 2018-05-24 NOTE — Progress Notes (Signed)

## 2018-06-06 ENCOUNTER — Telehealth: Payer: Self-pay

## 2018-06-06 NOTE — Telephone Encounter (Signed)
Error

## 2018-06-07 ENCOUNTER — Telehealth: Payer: Self-pay | Admitting: Internal Medicine

## 2018-06-07 NOTE — Telephone Encounter (Signed)
Patient called to state she is seeing Dr. Gloriann Loanavid [facility]ropractor [facility]).  Focus Referral #[id number] eff 06/06/18 x 1 year.

## 2018-06-28 ENCOUNTER — Other Ambulatory Visit: Payer: Self-pay | Admitting: Physician Assistant

## 2018-07-29 ENCOUNTER — Telehealth: Payer: No Typology Code available for payment source | Admitting: Family

## 2018-07-29 DIAGNOSIS — J019 Acute sinusitis, unspecified: Secondary | ICD-10-CM

## 2018-07-29 DIAGNOSIS — B9689 Other specified bacterial agents as the cause of diseases classified elsewhere: Secondary | ICD-10-CM

## 2018-07-29 MED ORDER — AMOXICILLIN-POT CLAVULANATE 875-125 MG PO TABS: 1.0000 | ORAL_TABLET | Freq: Two times a day (BID) | ORAL | 0 refills | Status: DC

## 2018-07-29 MED FILL — AMOX-CLAV 875-125 MG TABLET: 875-125 | 7 days supply | Qty: 14 | Fill #0

## 2018-07-29 NOTE — Progress Notes (Signed)

## 2018-08-11 ENCOUNTER — Encounter: Payer: Self-pay | Admitting: Internal Medicine

## 2018-08-11 ENCOUNTER — Ambulatory Visit (INDEPENDENT_AMBULATORY_CARE_PROVIDER_SITE_OTHER): Payer: No Typology Code available for payment source | Admitting: Internal Medicine

## 2018-08-11 VITALS — BP 110/80 | HR 89 | Temp 98.5°F | Ht 64.75 in

## 2018-08-11 DIAGNOSIS — M79642 Pain in left hand: Secondary | ICD-10-CM | POA: Diagnosis not present

## 2018-08-11 DIAGNOSIS — F411 Generalized anxiety disorder: Secondary | ICD-10-CM

## 2018-08-11 DIAGNOSIS — R200 Anesthesia of skin: Secondary | ICD-10-CM

## 2018-08-11 DIAGNOSIS — M79641 Pain in right hand: Secondary | ICD-10-CM | POA: Diagnosis not present

## 2018-08-11 NOTE — Progress Notes (Signed)
   Subjective:    Patient ID: Kristina Stewart, female    DOB: Feb 15, 1986, 33 y.o.   MRN: [id number]  HPI  Patient is a 33 year old White Female who works as a a Education officer, environmental at [employer].  She performs both cardiology and vascular ultrasounds as requested by her supervisor.  Recently has had to do a fair amount of cardiology ultrasound work.  Began to notice that both hands became numb basically in the fourth and fifth fingers holding the cardiology ultrasound transducer which is more dome-shaped and larger than a vascular study transducer.  This was concerning to her and she mentioned it to her supervisor but supervisor indicated she would need to continue doing cardiology studies.  Has felt that the strength in her hands is decreased since she has been doing more these cardiology studies.  She says she was told by a massage therapist she might have Ehlers Danlos syndrome because of some possible hyperextensibility noticed during a massage.  Patient has a history of craniotomy for Arnold-Chiari malformation in [place] in May 2006.  She is anxious about this recurring.  In September 2018 she delivered a healthy baby boy.  See health maintenance exam February 2019 for additional history, family history and social history.  Review of Systems see above     Objective:   Physical Exam  Currently no numbness in her fingers but her complaint of numbness is in the ulnar distribution bilaterally.  She is anxious and concerned that she may have to continue doing cardiology ultrasounds and is worried about long-term consequences of hand numbness.      Assessment & Plan:  Bilateral ulnar radiculopathies based on history  Bilateral hand pain and reported weakness-check sed rate, ANA, rheumatoid factor and CCP  Anxiety state  History of surgery for Arnold-Chiari malformation in 2006  Plan: Patient will see Neurologist for further evaluation of bilateral hand numbness.  She has concerns about  possible Ehlers Danlos syndrome but it does not seem to me that her joints are hyperextensible

## 2018-08-11 NOTE — Patient Instructions (Signed)
Rheumatology studies drawn and pending.  Recommend neurology evaluation for hand issues.

## 2018-08-12 LAB — RHEUMATOID FACTOR

## 2018-08-12 LAB — SEDIMENTATION RATE: SED RATE: 22 mm/h — AB (ref 0–20)

## 2018-08-12 LAB — CYCLIC CITRUL PEPTIDE ANTIBODY, IGG

## 2018-08-12 LAB — ANA: ANA: NEGATIVE

## 2018-08-22 ENCOUNTER — Telehealth: Payer: No Typology Code available for payment source | Admitting: Family

## 2018-08-22 DIAGNOSIS — H00019 Hordeolum externum unspecified eye, unspecified eyelid: Secondary | ICD-10-CM | POA: Diagnosis not present

## 2018-08-22 MED ORDER — NEOMYCIN-POLYMYXIN-HC 3.5-10000-1 OP SUSP: 3.0000 [drp] | Freq: Four times a day (QID) | OPHTHALMIC | 0 refills | Status: DC

## 2018-08-22 MED FILL — ETONOGESTREL-ETHINYL ESTRAD: 0.12-0.015 | 84 days supply | Qty: 3 | Fill #0 | Status: TO

## 2018-08-22 MED FILL — NEOMYCIN/POLY/HC EYE DROPS: 3.5-10000-1 | 13 days supply | Qty: 8 | Fill #0

## 2018-08-22 NOTE — Progress Notes (Signed)
We are sorry that you are not feeling well. Here is how we plan to help!  Based on what you have shared with me it looks like you have a stye.  A stye is an inflammation of the eyelid.  It is often a red, painful lump near the edge of the eyelid that may look like a boil or a pimple.  A stye develops when an infection occurs at the base of an eyelash.   We have made appropriate suggestions for you based upon your presentation: Your symptoms may indicate an infection of the sclera.  The use of anti-inflammatory and antibiotic eye drops for a week will help resolve this condition.  I have sent in neomycin-polymyxin HC opthalmic suspension, two to three drops in the affected eye every 4 hours.  If your symptoms do not improve over the next two to three days you should be seen in your doctor's office.   Approximately  minutes spent reviewing and documenting in patient's chart.   HOME CARE:   Wash your hands often!  Let the stye open on its own. Don't squeeze or open it.  Don't rub your eyes. This can irritate your eyes and let in bacteria.  If you need to touch your eyes, wash your hands first.  Don't wear eye makeup or contact lenses until the area has healed.  GET HELP RIGHT AWAY IF:   Your symptoms do not improve.  You develop blurred or loss of vision.  Your symptoms worsen (increased discharge, pain or redness).  Thank you for choosing an e-visit.  Your e-visit answers were reviewed by a board certified advanced clinical practitioner to complete your personal care plan.  Depending upon the condition, your plan could have included both over the counter or prescription medications.  Please review your pharmacy choice.  Make sure the pharmacy is open so you can pick up prescription now.  If there is a problem, you may contact your provider through [employer] and have the prescription routed to another pharmacy.    Your safety is important to [place].  If you have drug allergies  check your prescription carefully.  For the next 24 hours you can use MyChart to ask questions about today's visit, request a non-urgent call back, or ask for a work or school excuse.  You will get an email in the next two days asking about your experience.  I hope you that your e-visit has been valuable and will speed your recovery.

## 2018-08-29 ENCOUNTER — Telehealth: Payer: No Typology Code available for payment source | Admitting: Family

## 2018-08-29 DIAGNOSIS — H1010 Acute atopic conjunctivitis, unspecified eye: Secondary | ICD-10-CM

## 2018-08-29 NOTE — Progress Notes (Signed)
We are sorry that you are not feeling well.  Here is how we plan to help!  Based on what you have shared with me it looks like you have conjunctivitis.  Conjunctivitis is a common inflammatory or infectious condition of the eye that is often referred to as "pink eye".  In most cases it is contagious (viral or bacterial). However, not all conjunctivitis requires antibiotics (ex. Allergic).  We have made appropriate suggestions for you based upon your presentation.  I recommend that you use OpconA, 1-2 drops every 4-6 hours (an over the counter allergy drop available at your local pharmacy).  Your pharmacist may have an alternative suggestion.  Pink eye can be highly contagious.  It is typically spread through direct contact with secretions, or contaminated objects or surfaces that one may have touched.  Strict handwashing is suggested with soap and water is urged.  If not available, use alcohol based had sanitizer.  Avoid unnecessary touching of the eye.  If you wear contact lenses, you will need to refrain from wearing them until you see no white discharge from the eye for at least 24 hours after being on medication.  You should see symptom improvement in 1-2 days after starting the medication regimen.  Call us if symptoms are not improved in 1-2 days.  Home Care:  Wash your hands often!  Do not wear your contacts until you complete your treatment plan.  Avoid sharing towels, bed linen, personal items with a person who has pink eye.  See attention for anyone in your home with similar symptoms.  Get Help Right Away If:  Your symptoms do not improve.  You develop blurred or loss of vision.  Your symptoms worsen (increased discharge, pain or redness)  Your e-visit answers were reviewed by a board certified advanced clinical practitioner to complete your personal care plan.  Depending on the condition, your plan could have included both over the counter or prescription medications.  If there  is a problem please reply  once you have received a response from your provider.  Your safety is important to us.  If you have drug allergies check your prescription carefully.    You can use MyChart to ask questions about today's visit, request a non-urgent call back, or ask for a work or school excuse for 24 hours related to this e-Visit. If it has been greater than 24 hours you will need to follow up with your provider, or enter a new e-Visit to address those concerns.   You will get an e-mail in the next two days asking about your experience.  I hope that your e-visit has been valuable and will speed your recovery. Thank you for using e-visits.     

## 2018-09-02 IMAGING — MR MR ANKLE*L* W/O CM
4 of 5 series · 19 of 40 positions shown · non-contrast
Comparison: None.

CLINICAL DATA: Acute left ankle pain with twisting injury

EXAM:
MRI OF THE LEFT ANKLE WITHOUT CONTRAST
TECHNIQUE: Multiplanar, multisequence MR imaging of the ankle was performed. No
intravenous contrast was administered.

[Series 3: T2 fat-sat · sagittal · 3.0mm · 0.31mm/px · 5 of 23 slices shown (1 of 3)]
[im 1/23]
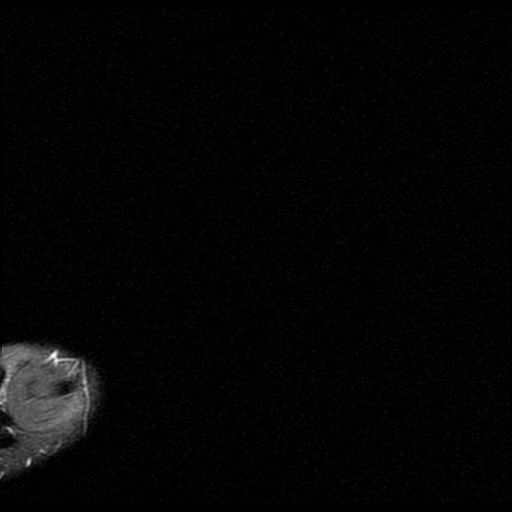
[im 5/23]
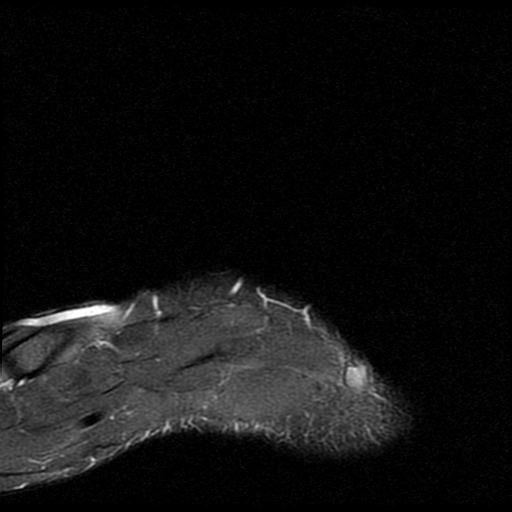
[im 9/23]
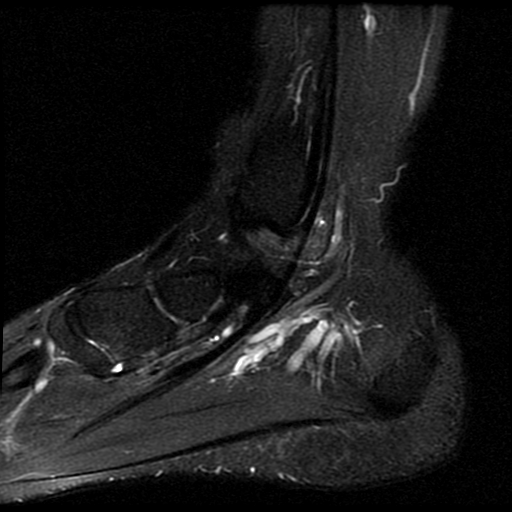
[im 14/23]
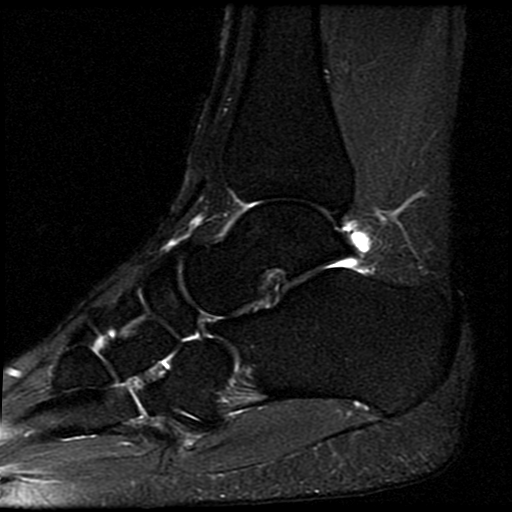
[im 23/23]
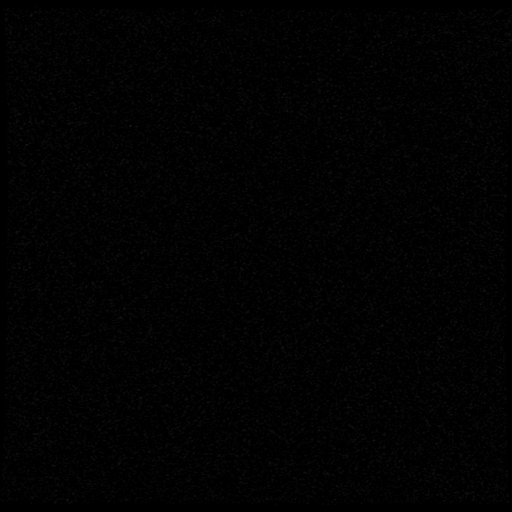

[Series 4: T2 fat-sat · coronal · 3.0mm · 0.29mm/px · 3 of 27 slices shown (2 of 3)]
[im 5/27]
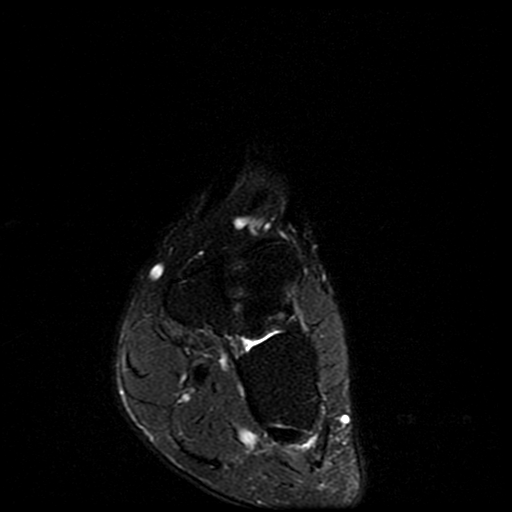
[im 14/27]
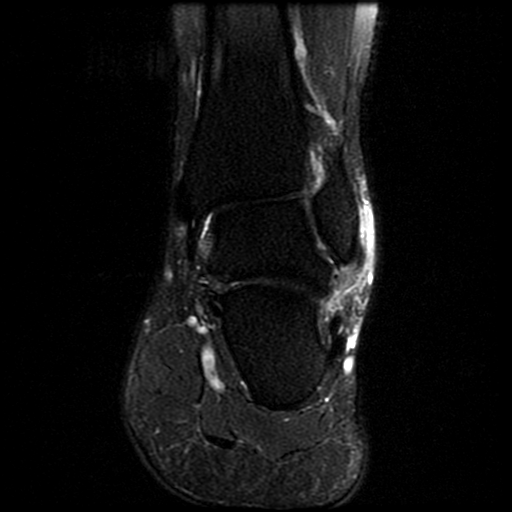
[im 22/27]
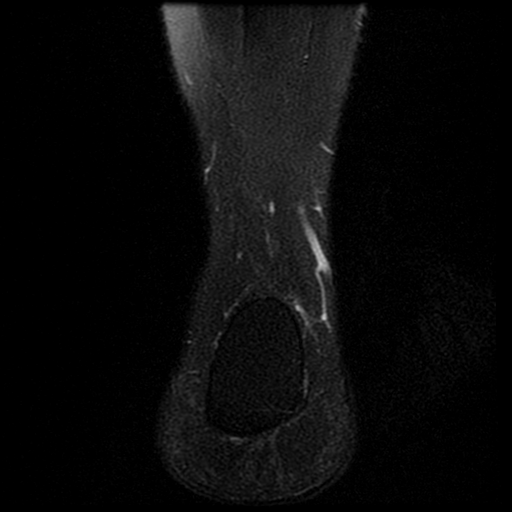

[Series 5: PD · axial · 3.0mm · 0.29mm/px · z∈[-73,+67]mm · 8 of 36 slices shown]
[im 1/36]
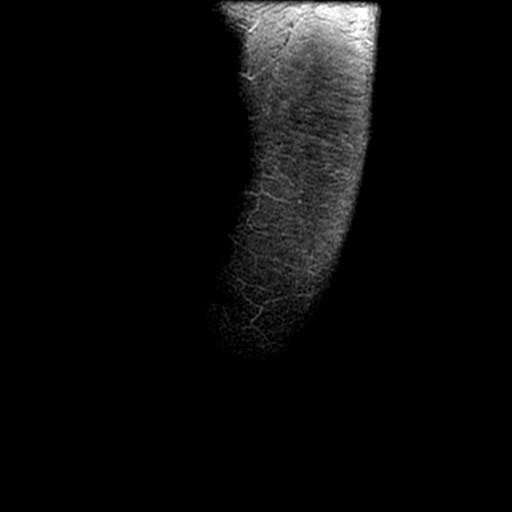
[im 4/36]
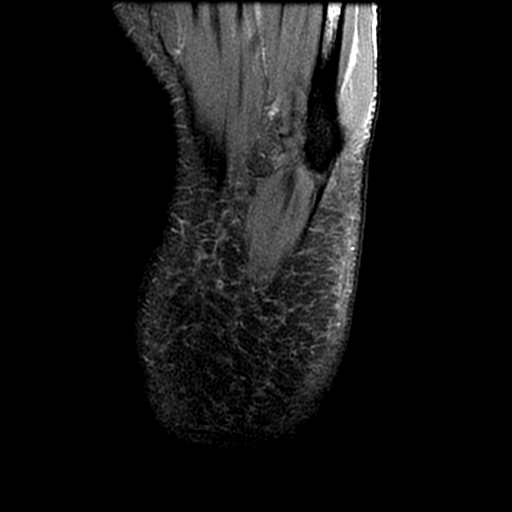
[im 12/36]
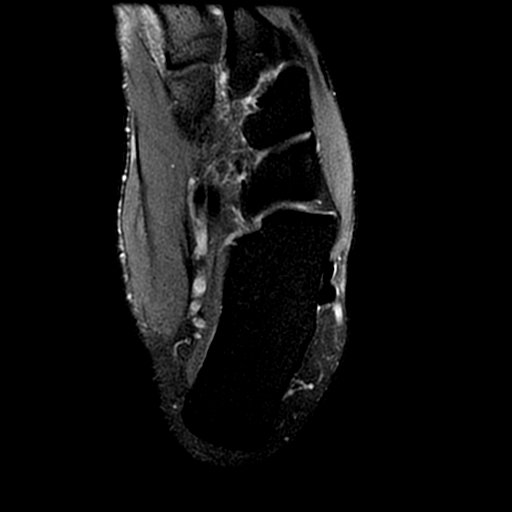
[im 16/36]
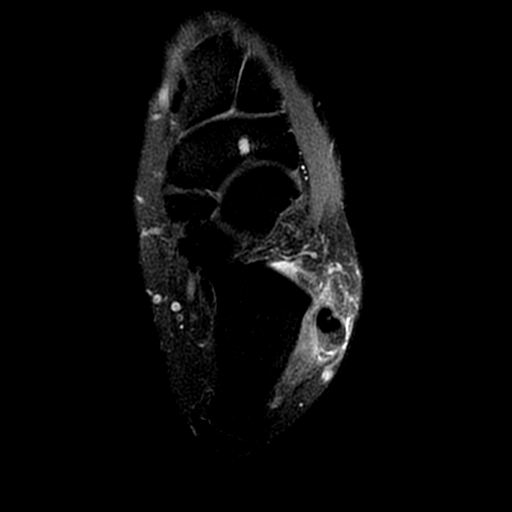
[im 20/36]
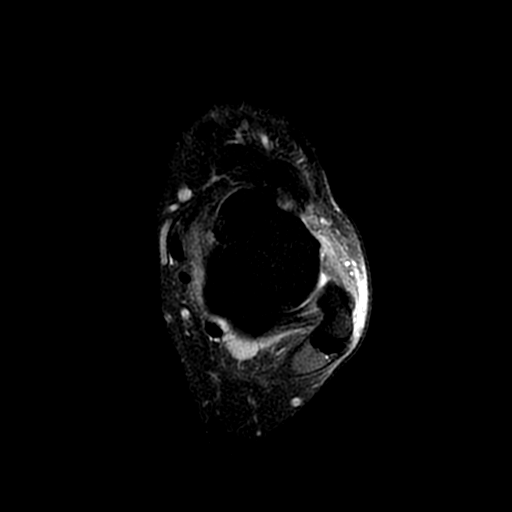
[im 24/36]
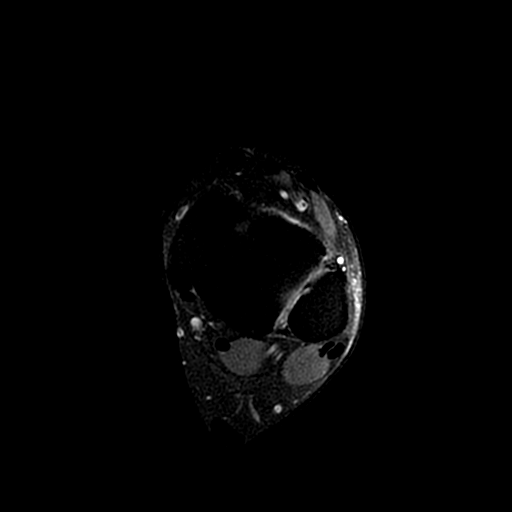
[im 32/36]
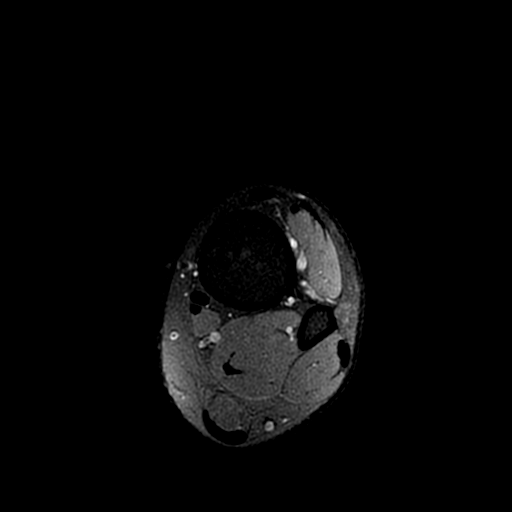
[im 36/36]
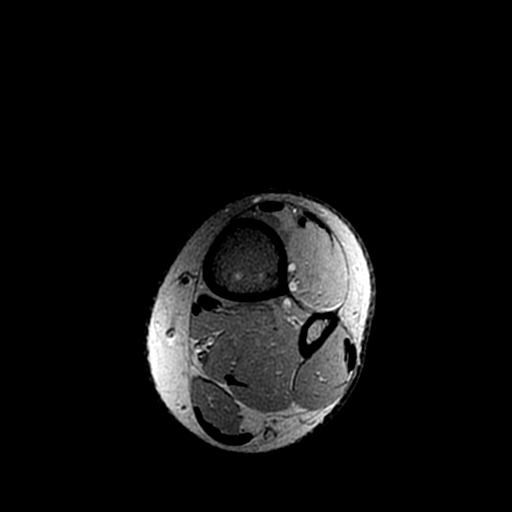

[Series 6: T2 fat-sat · axial · 3.0mm · 0.29mm/px · z∈[-61,+51]mm · 3 of 36 slices shown (3 of 3)]
[im 4/36]
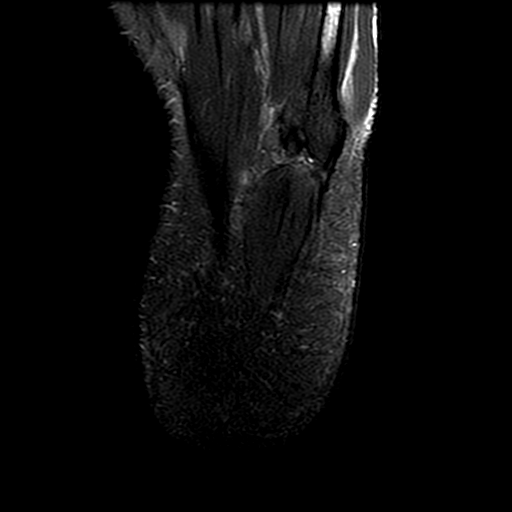
[im 20/36]
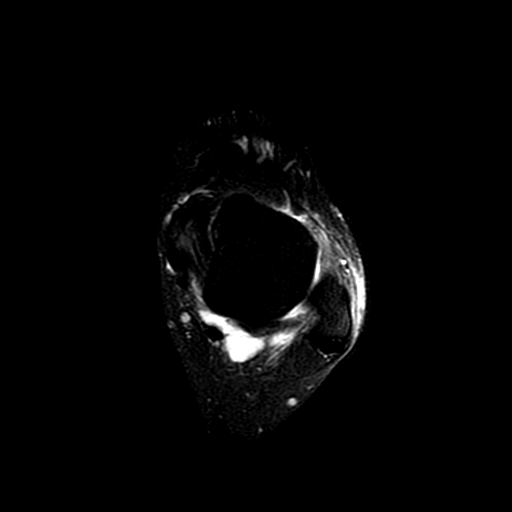
[im 32/36]
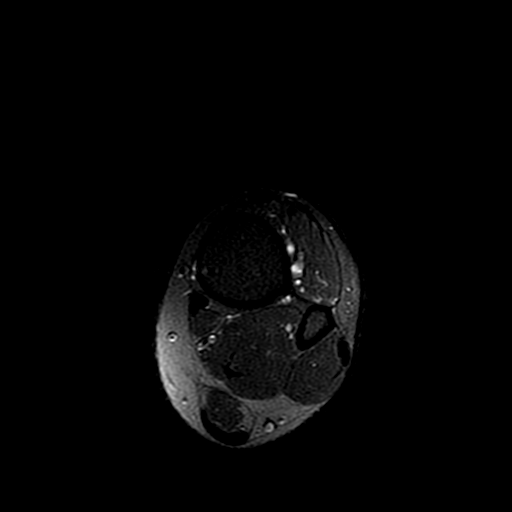

[19 of 40 positions shown; findings below may reference images not displayed]

FINDINGS: TENDONS

Peroneal: Peroneal longus tendon intact. Peroneal brevis intact.

Posteromedial: Posterior tibial tendon intact. Flexor hallucis
longus tendon intact. Flexor digitorum longus tendon intact.

Anterior: Tibialis anterior tendon intact. Extensor hallucis longus
tendon intact Extensor digitorum longus tendon intact.

Achilles:  Intact.

Plantar Fascia: Intact.

LIGAMENTS

Lateral: Complete tear of the anterior talofibular ligament.
Complete tear of the calcaneofibular ligament. Posterior talofibular
ligament intact. Anterior and posterior tibiofibular ligaments
intact.

Medial: Deltoid ligament intact. Spring ligament intact.

CARTILAGE

Ankle Joint: Small ankle joint effusion. Normal ankle mortise. No
chondral defect.

Subtalar Joints/Sinus Tarsi: Normal subtalar joints. No subtalar
joint effusion. Normal sinus tarsi.

Bones: No acute fracture or dislocation. Subchondral reactive marrow
edema in the lateral aspect of navicular.

Soft Tissue: No fluid collection or hematoma.
IMPRESSION: 1. Complete tear of the anterior talofibular ligament and
calcaneofibular ligament.
2. Subchondral reactive marrow edema in the lateral aspect of
navicular.

## 2018-09-09 ENCOUNTER — Telehealth: Payer: No Typology Code available for payment source | Admitting: Family

## 2018-09-09 DIAGNOSIS — J029 Acute pharyngitis, unspecified: Secondary | ICD-10-CM | POA: Diagnosis not present

## 2018-09-09 MED ORDER — PREDNISONE 5 MG PO TABS: 5.0000 mg | ORAL_TABLET | ORAL | 0 refills | Status: DC

## 2018-09-09 MED ORDER — BENZONATATE 100 MG PO CAPS: 100.0000 mg | ORAL_CAPSULE | Freq: Three times a day (TID) | ORAL | 0 refills | Status: DC | PRN

## 2018-09-09 NOTE — Progress Notes (Signed)
Thank you for the details you included in the comment boxes. Those details are very helpful in determining the best course of treatment for you and help us to provide the best care.  We are sorry that you are not feeling well.  Here is how we plan to help!  Based on your presentation I believe you most likely have A cough due to a virus.  This is called viral bronchitis and is best treated by rest, plenty of fluids and control of the cough.  You may use Ibuprofen or Tylenol as directed to help your symptoms.     In addition you may use A non-prescription cough medication called Mucinex DM: take 2 tablets every 12 hours. and A prescription cough medication called Tessalon Perles 100mg. You may take 1-2 capsules every 8 hours as needed for your cough.  Prednisone 5 mg daily for 6 days (see taper instructions below)  Directions for 6 day taper: Day 1: 2 tablets before breakfast, 1 after both lunch & dinner and 2 at bedtime Day 2: 1 tab before breakfast, 1 after both lunch & dinner and 2 at bedtime Day 3: 1 tab at each meal & 1 at bedtime Day 4: 1 tab at breakfast, 1 at lunch, 1 at bedtime Day 5: 1 tab at breakfast & 1 tab at bedtime Day 6: 1 tab at breakfast   From your responses in the eVisit questionnaire you describe inflammation in the upper respiratory tract which is causing a significant cough.  This is commonly called Bronchitis and has four common causes:    Allergies  Viral Infections  Acid Reflux  Bacterial Infection Allergies, viruses and acid reflux are treated by controlling symptoms or eliminating the cause. An example might be a cough caused by taking certain blood pressure medications. You stop the cough by changing the medication. Another example might be a cough caused by acid reflux. Controlling the reflux helps control the cough.  USE OF BRONCHODILATOR ("RESCUE") INHALERS: There is a risk from using your bronchodilator too frequently.  The risk is that over-reliance on  a medication which only relaxes the muscles surrounding the breathing tubes can reduce the effectiveness of medications prescribed to reduce swelling and congestion of the tubes themselves.  Although you feel brief relief from the bronchodilator inhaler, your asthma may actually be worsening with the tubes becoming more swollen and filled with mucus.  This can delay other crucial treatments, such as oral steroid medications. If you need to use a bronchodilator inhaler daily, several times per day, you should discuss this with your provider.  There are probably better treatments that could be used to keep your asthma under control.     HOME CARE . Only take medications as instructed by your medical team. . Complete the entire course of an antibiotic. . Drink plenty of fluids and get plenty of rest. . Avoid close contacts especially the very young and the elderly . Cover your mouth if you cough or cough into your sleeve. . Always remember to wash your hands . A steam or ultrasonic humidifier can help congestion.   GET HELP RIGHT AWAY IF: . You develop worsening fever. . You become short of breath . You cough up blood. . Your symptoms persist after you have completed your treatment plan MAKE SURE YOU   Understand these instructions.  Will watch your condition.  Will get help right away if you are not doing well or get worse.  Your e-visit answers were reviewed   by a board certified advanced clinical practitioner to complete your personal care plan.  Depending on the condition, your plan could have included both over the counter or prescription medications. If there is a problem please reply  once you have received a response from your provider. Your safety is important to us.  If you have drug allergies check your prescription carefully.    You can use MyChart to ask questions about today's visit, request a non-urgent call back, or ask for a work or school excuse for 24 hours related to this  e-Visit. If it has been greater than 24 hours you will need to follow up with your provider, or enter a new e-Visit to address those concerns. You will get an e-mail in the next two days asking about your experience.  I hope that your e-visit has been valuable and will speed your recovery. Thank you for using e-visits.    

## 2018-10-06 ENCOUNTER — Telehealth: Payer: No Typology Code available for payment source | Admitting: Physician Assistant

## 2018-10-06 DIAGNOSIS — J3089 Other allergic rhinitis: Secondary | ICD-10-CM

## 2018-10-06 MED ORDER — AZELASTINE HCL 0.05 % OP SOLN: 1.0000 [drp] | Freq: Two times a day (BID) | OPHTHALMIC | 12 refills | Status: DC

## 2018-10-06 NOTE — Progress Notes (Signed)
E visit for Allergic Rhinitis We are sorry that you are not feeling well.  Here is how we plan to help!  Thankfully your symptoms do not seem consistent with Coronarvirus.   Based on what you have shared with me it looks like you have Allergic Rhinitis.  Rhinitis is when a reaction occurs that causes nasal congestion, runny nose, sneezing, and itching.  Most types of rhinitis are caused by an inflammation and are associated with symptoms in the eyes ears or throat. There are several types of rhinitis.  The most common are acute rhinitis, which is usually caused by a viral illness, allergic or seasonal rhinitis, and nonallergic or year-round rhinitis.  Nasal allergies occur certain times of the year.  Allergic rhinitis is caused when allergens in the air trigger the release of histamine in the body.  Histamine causes itching, swelling, and fluid to build up in the fragile linings of the nasal passages, sinuses and eyelids.  An itchy nose and clear discharge are common.  I recommend the following over the counter treatments: You should take a daily dose of antihistamine and Xyzal 5 mg take 1 tablet daily  I also would recommend a nasal spray: Flonase 2 sprays into each nostril once daily  You may also benefit from eye drops such as: A prescription allergy eye drop called Optivar. I have sent a prescription to your pharmacy.  Giving the chest tightness on occasion you should contact your primary care provider. I do agree it is likely anxiety as the alprazolam helps the symptoms, but this needs further assessment. Please contact Dr. Lenord FellersBaxley ASAP. If you note any true chest pain, shortness of breath, sweating, please go to the ER for assessment.   HOME CARE:   You can use an over-the-counter saline nasal spray as needed  Avoid areas where there is heavy dust, mites, or molds  Stay indoors on windy days during the pollen season  Keep windows closed in home, at least in bedroom; use air  conditioner.  Use high-efficiency house air filter  Keep windows closed in car, turn AC on re-circulate  Avoid playing out with dog during pollen season  GET HELP RIGHT AWAY IF:   If your symptoms do not improve within 10 days  You become short of breath  You develop yellow or green discharge from your nose for over 3 days  You have coughing fits  MAKE SURE YOU:   Understand these instructions  Will watch your condition  Will get help right away if you are not doing well or get worse  Thank you for choosing an e-visit. Your e-visit answers were reviewed by a board certified advanced clinical practitioner to complete your personal care plan. Depending upon the condition, your plan could have included both over the counter or prescription medications. Please review your pharmacy choice. Be sure that the pharmacy you have chosen is open so that you can pick up your prescription now.  If there is a problem you may message your provider in MyChart to have the prescription routed to another pharmacy. Your safety is important to us. If you have drug allergies check your prescription carefully.  For the next 24 hours, you can use MyChart to ask questions about today's visit, request a non-urgent call back, or ask for a work or school excuse from your e-visit provider. You will get an email in the next two days asking about your experience. I hope that your e-visit has been valuable and will speed  your recovery.

## 2018-10-07 MED FILL — FLUTICASONE PROP 50 MCG SPR: 50 | 30 days supply | Qty: 16 | Fill #1

## 2018-10-07 MED FILL — AZELASTINE HCL 0.05% DROPS: 0.05 | 30 days supply | Qty: 6 | Fill #0

## 2018-10-17 ENCOUNTER — Ambulatory Visit: Payer: No Typology Code available for payment source | Admitting: Neurology

## 2018-10-18 ENCOUNTER — Telehealth: Payer: No Typology Code available for payment source | Admitting: Nurse Practitioner

## 2018-10-18 DIAGNOSIS — B9789 Other viral agents as the cause of diseases classified elsewhere: Secondary | ICD-10-CM

## 2018-10-18 DIAGNOSIS — J069 Acute upper respiratory infection, unspecified: Secondary | ICD-10-CM

## 2018-10-18 MED ORDER — FLUTICASONE PROPIONATE 50 MCG/ACT NA SUSP: 2.0000 | Freq: Every day | NASAL | 6 refills | Status: DC

## 2018-10-18 MED ORDER — BENZONATATE 100 MG PO CAPS: 100.0000 mg | ORAL_CAPSULE | Freq: Three times a day (TID) | ORAL | 0 refills | Status: DC | PRN

## 2018-10-18 NOTE — Progress Notes (Signed)
We are sorry you are not feeling well.  Here is how we plan to help!  Based on what you have shared with me, it looks like you may have a viral upper respiratory infection.  Upper respiratory infections are caused by a large number of viruses; however, rhinovirus is the most common cause.   Symptoms vary from person to person, with common symptoms including sore throat, cough, and fatigue or lack of energy.  A low-grade fever of up to 100.4 may present, but is often uncommon.  Symptoms vary however, and are closely related to a person's age or underlying illnesses.  The most common symptoms associated with an upper respiratory infection are nasal discharge or congestion, cough, sneezing, headache and pressure in the ears and face.  These symptoms usually persist for about 3 to 10 days, but can last up to 2 weeks.  It is important to know that upper respiratory infections do not cause serious illness or complications in most cases.    Upper respiratory infections can be transmitted from person to person, with the most common method of transmission being a person's hands.  The virus is able to live on the skin and can infect other persons for up to 2 hours after direct contact.  Also, these can be transmitted when someone coughs or sneezes; thus, it is important to cover the mouth to reduce this risk.  To keep the spread of the illness at bay, good hand hygiene is very important.  This is an infection that is most likely caused by a virus. There are no specific treatments other than to help you with the symptoms until the infection runs its course.  We are sorry you are not feeling well.  Here is how we plan to help!   For nasal congestion, you may use an oral decongestants such as Mucinex D or if you have glaucoma or high blood pressure use plain Mucinex.  Saline nasal spray or nasal drops can help and can safely be used as often as needed for congestion.  For your congestion, I have prescribed Fluticasone  nasal spray one spray in each nostril twice a day  If you do not have a history of heart disease, hypertension, diabetes or thyroid disease, prostate/bladder issues or glaucoma, you may also use Sudafed to treat nasal congestion.  It is highly recommended that you consult with a pharmacist or your primary care physician to ensure this medication is safe for you to take.     If you have a cough, you may use cough suppressants such as Delsym and Robitussin.  If you have glaucoma or high blood pressure, you can also use Coricidin HBP.   For cough I have prescribed for you A prescription cough medication called Tessalon Perles 100 mg. You may take 1-2 capsules every 8 hours as needed for cough  If you have a sore or scratchy throat, use a saltwater gargle-  to  teaspoon of salt dissolved in a 4-ounce to 8-ounce glass of warm water.  Gargle the solution for approximately 15-30 seconds and then spit.  It is important not to swallow the solution.  You can also use throat lozenges/cough drops and Chloraseptic spray to help with throat pain or discomfort.  Warm or cold liquids can also be helpful in relieving throat pain.  For headache, pain or general discomfort, you can use Ibuprofen or Tylenol as directed.   Some authorities believe that zinc sprays or the use of Echinacea may shorten the   course of your symptoms.   HOME CARE . Only take medications as instructed by your medical team. . Be sure to drink plenty of fluids. Water is fine as well as fruit juices, sodas and electrolyte beverages. You may want to stay away from caffeine or alcohol. If you are nauseated, try taking small sips of liquids. How do you know if you are getting enough fluid? Your urine should be a pale yellow or almost colorless. . Get rest. . Taking a steamy shower or using a humidifier may help nasal congestion and ease sore throat pain. You can place a towel over your head and breathe in the steam from hot water coming from a  faucet. . Using a saline nasal spray works much the same way. . Cough drops, hard candies and sore throat lozenges may ease your cough. . Avoid close contacts especially the very young and the elderly . Cover your mouth if you cough or sneeze . Always remember to wash your hands.   GET HELP RIGHT AWAY IF: . You develop worsening fever. . If your symptoms do not improve within 10 days . You develop yellow or green discharge from your nose over 3 days. . You have coughing fits . You develop a severe head ache or visual changes. . You develop shortness of breath, difficulty breathing or start having chest pain . Your symptoms persist after you have completed your treatment plan  MAKE SURE YOU   Understand these instructions.  Will watch your condition.  Will get help right away if you are not doing well or get worse.  Your e-visit answers were reviewed by a board certified advanced clinical practitioner to complete your personal care plan. Depending upon the condition, your plan could have included both over the counter or prescription medications. Please review your pharmacy choice. If there is a problem, you may call our nursing hot line at and have the prescription routed to another pharmacy. Your safety is important to us. If you have drug allergies check your prescription carefully.   You can use MyChart to ask questions about today's visit, request a non-urgent call back, or ask for a work or school excuse for 24 hours related to this e-Visit. If it has been greater than 24 hours you will need to follow up with your provider, or enter a new e-Visit to address those concerns. You will get an e-mail in the next two days asking about your experience.  I hope that your e-visit has been valuable and will speed your recovery. Thank you for using e-visits.    5-10 minutes spent reviewing and documenting in chart.   

## 2018-10-19 ENCOUNTER — Telehealth: Payer: Self-pay | Admitting: Neurology

## 2018-10-19 MED FILL — BENZONATATE 100 MG CAPS: 100 | 6 days supply | Qty: 20 | Fill #0

## 2018-10-19 MED FILL — FLUTICASONE PROP 50 MCG SPR: 50 | 30 days supply | Qty: 16 | Fill #0

## 2018-10-19 NOTE — Telephone Encounter (Signed)
Pt consented to a Virtual Visit and for the insurance to be billed as such. Email has been confirmed. Pt was informed that any copays due will be billed.

## 2018-10-19 NOTE — Telephone Encounter (Signed)
Appt is being changed to vv with pt's consent d/t the covid 19 pandemic; office is severely reducing in person visits at this time and converting to telemedicine for the safety of our patients and staff.   Meeting number (access code): [phone] Meeting password: [email or web address]  Email sent to pt with link to join & instructions to download [email or web address]. [email or web address]

## 2018-10-22 ENCOUNTER — Emergency Department (HOSPITAL_COMMUNITY): Payer: No Typology Code available for payment source

## 2018-10-22 ENCOUNTER — Encounter (HOSPITAL_COMMUNITY): Payer: Self-pay | Admitting: *Deleted

## 2018-10-22 ENCOUNTER — Other Ambulatory Visit: Payer: Self-pay

## 2018-10-22 ENCOUNTER — Emergency Department (HOSPITAL_COMMUNITY)
Admission: EM | Admit: 2018-10-22 | Discharge: 2018-10-23 | Disposition: A | Discharge status: Home / Self Care | Payer: No Typology Code available for payment source | Attending: Emergency Medicine | Admitting: Emergency Medicine

## 2018-10-22 DIAGNOSIS — Z79899 Other long term (current) drug therapy: Secondary | ICD-10-CM | POA: Diagnosis not present

## 2018-10-22 DIAGNOSIS — J069 Acute upper respiratory infection, unspecified: Secondary | ICD-10-CM | POA: Insufficient documentation

## 2018-10-22 DIAGNOSIS — Z91040 Latex allergy status: Secondary | ICD-10-CM | POA: Insufficient documentation

## 2018-10-22 DIAGNOSIS — Z20828 Contact with and (suspected) exposure to other viral communicable diseases: Secondary | ICD-10-CM | POA: Insufficient documentation

## 2018-10-22 DIAGNOSIS — R05 Cough: Secondary | ICD-10-CM | POA: Diagnosis present

## 2018-10-22 DIAGNOSIS — Z91010 Allergy to peanuts: Secondary | ICD-10-CM | POA: Diagnosis not present

## 2018-10-22 DIAGNOSIS — J45909 Unspecified asthma, uncomplicated: Secondary | ICD-10-CM | POA: Diagnosis not present

## 2018-10-22 NOTE — ED Notes (Signed)
Ambulated pt in room on RA. Patients oxygen saturation stayed between 98-100%. Pt reports "I feel a little SHOB, but it may just be the mask."

## 2018-10-22 NOTE — ED Notes (Signed)
Bed: WA12 Expected date:  Expected time:  Means of arrival:  Comments: Negative pressure 

## 2018-10-22 NOTE — ED Triage Notes (Signed)
Pt stated "I've had the sore throat x 6 days, have the sinus stuff, was told I had URI on e-visit this past Tuesday.  I spoke with Dr. Rush Landmark @ [facility] and he said they would probably swab me since they were going to swab me on Monday anyway.  Tonight I just feel different, a little lethargic, my arms and legs tingly-like feeling."

## 2018-10-22 NOTE — ED Provider Notes (Signed)
[facility]-EMERGENCY DEPT Provider Note   CSN: [id number] Arrival date & time: 10/22/18  2209    History   Chief Complaint Chief Complaint  Patient presents with  . Cough    HPI Kristina Stewart is a 33 y.o. female.     33 year old female with a history of asthma, seasonal allergies presents to the emergency department for upper respiratory symptoms.  Kristina Stewart endorses 6 days of a sore throat and nasal congestion with cough that followed 2 days later.  Kristina Stewart was told to take Mucinex DM and Delsym after Kristina Stewart was seen on an ED visit on Tuesday.  Kristina Stewart took her last dose of Mucinex DM at 1600 tonight.  Was sitting on the couch with her husband and began to have a tingling sensation in her bilateral upper and lower extremities.  This sensation has persisted.  Kristina Stewart has not had any shortness of breath, lightheadedness, chest pain.  Got her child's pulse oximetry out of storage and reports reading of 86% to 90% on room air at home.  Kristina Stewart was encouraged to be evaluated in the ED.  Saturations in triage have been normal.  Denies experiencing similar symptoms in the past.  Kristina Stewart also expresses concern about her elevated blood pressure in triage as her blood pressure usually runs normal.  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    The history is provided by the patient. No language interpreter was used.  Cough    Past Medical History:  Diagnosis Date  . Allergy   . Asthma   . IBS (irritable bowel syndrome)   . Obesity   . Seasonal allergic rhinitis   . Strep pharyngitis   . TMJ (temporomandibular joint syndrome)     Patient Active Problem List   Diagnosis Date Noted  . Family history of fragile X syndrome 03/25/2016  . History of acute bronchitis with bronchospasm 04/26/2012  . MTHFR mutation (HCC) 04/01/2012  . 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 BRAIN SURGERY     craniotomy/arnold chiari malformation  .  BRAIN SURGERY     craniotomy/arnold chiari malformation     OB History    Gravida  2   Para  1   Term  1   Preterm      AB  1   Living  1     SAB  1   TAB      Ectopic      Multiple  0   Live Births  1            Home Medications    Prior to Admission medications   Medication Sig Start Date End Date Taking? Authorizing Provider  albuterol (PROVENTIL HFA;VENTOLIN HFA) 108 (90 Base) MCG/ACT inhaler Inhale 1-2 puffs into the lungs every 6 (six) hours as needed for wheezing or shortness of breath. 08/12/17   Margaree Mackintosh, MD  ALPRAZolam Prudy Feeler) 0.25 MG tablet Take 1 tablet (0.25 mg total) by mouth at bedtime as needed for anxiety. 08/12/17   Margaree Mackintosh, MD  azelastine (OPTIVAR) 0.05 % ophthalmic solution Place 1 drop into both eyes 2 (two) times daily. 10/06/18   Waldon Merl, PA-C  benzonatate (TESSALON PERLES) 100 MG capsule Take 1 capsule (100 mg total) by mouth 3 (three) times daily as needed. 10/18/18   Daphine Deutscher, Mary-Margaret, FNP  fluticasone (FLONASE) 50 MCG/ACT nasal spray Place 2 sprays into both nostrils daily. 10/18/18  Daphine Deutscher, Mary-Margaret, FNP  hyoscyamine (LEVSIN) 0.125 MG tablet Take 1 tablet (0.125 mg total) by mouth 4 (four) times daily -  before meals and at bedtime. Patient not taking: Reported on 08/11/2018 08/12/17   Margaree Mackintosh, MD  norgestimate-ethinyl estradiol (ESTARYLLA) 0.25-35 MG-MCG tablet Take 1 tablet by mouth daily.    [provider]    Family History Family History  Problem Relation Age of Onset  . Hypertension Mother   . Thyroid disease Mother   . Hyperlipidemia Mother   . Hypertension Father   . Irritable bowel syndrome Father   . Hyperlipidemia Father   . GER disease Sister   . Fragile X syndrome Sister   . Alzheimer's disease Paternal Grandfather   . Diabetes Paternal Grandmother   . Fibromyalgia Paternal Grandmother   . Thyroid disease Maternal Grandmother        HYPOTHYROID  . Hypertension Maternal  Grandmother   . Cancer Maternal Grandmother        LUNG  . Cancer Maternal Grandfather        LUNG    Social History Social History   Tobacco Use  . Smoking status: Never Smoker  . Smokeless tobacco: Never Used  Substance Use Topics  . Alcohol use: No    Alcohol/week: 4.0 standard drinks    Types: 4 Glasses of wine per week  . Drug use: No     Allergies   Almond (diagnostic); Other; Pistachio nut (diagnostic); and Latex   Review of Systems Review of Systems  Respiratory: Positive for cough.   Ten systems reviewed and are negative for acute change, except as noted in the HPI.    Physical Exam Updated Vital Signs BP 122/76   Pulse (!) 106   Temp 98.8 F (37.1 C) (Oral)   Resp 18   Ht  (1.651 m)   Wt 102.1 kg   LMP 10/14/2018 (Exact Date)   SpO2 97%   BMI 37.44 kg/m   Physical Exam Vitals signs and nursing note reviewed.  Constitutional:      General: Kristina Stewart is not in acute distress.    Appearance: Kristina Stewart is well-developed. Kristina Stewart is not diaphoretic.     Comments: Nontoxic appearing and in NAD  HENT:     Head: Normocephalic and atraumatic.  Eyes:     General: No scleral icterus.    Conjunctiva/sclera: Conjunctivae normal.  Neck:     Musculoskeletal: Normal range of motion.  Cardiovascular:     Rate and Rhythm: Regular rhythm. Tachycardia present.     Pulses: Normal pulses.  Pulmonary:     Effort: Pulmonary effort is normal. No respiratory distress.     Comments: Respirations even and unlabored. SpO2 99% on room air. Musculoskeletal: Normal range of motion.  Skin:    General: Skin is warm and dry.     Coloration: Skin is not pale.     Findings: No erythema or rash.  Neurological:     General: No focal deficit present.     Mental Status: Kristina Stewart is alert and oriented to person, place, and time.     Coordination: Coordination normal.     Comments: Moving all extremities spontaneously.  Psychiatric:        Behavior: Behavior normal.      ED Treatments  / Results  Labs (all labs ordered are listed, but only abnormal results are displayed) Labs Reviewed  SARS CORONAVIRUS 2 (HOSPITAL ORDER, PERFORMED IN [facility])    EKG None  Radiology Dg Chest Port 1 View  Result Date: 10/22/2018 CLINICAL DATA:  Shortness of breath EXAM: PORTABLE CHEST 1 VIEW COMPARISON:  08/18/2012 FINDINGS: Heart and mediastinal contours are within normal limits. No focal opacities or effusions. No acute bony abnormality. IMPRESSION: No active disease. Electronically Signed   By: Charlett NoseKevin  Dover M.D.   On: 10/22/2018 23:20    Procedures Procedures (including critical care time)  Medications Ordered in ED Medications - No data to display   10:50 PM Patient presents to the emergency department for tingling in her extremities which began while sitting on the couch at home with her husband.  Kristina Stewart had taken Mucinex DM prior to this.  Noted to be hypertensive in triage.  Question whether this may be due to anxiety surrounding symptoms versus use of medication containing pseudoephedrine.  It is certainly possible that her paresthesias may be secondary to symptomatic hypertension.  Kristina Stewart has no hypoxia on room air at this time.  No signs of respiratory distress.  Noted to be afebrile.  Will obtain chest x-ray and ambulate on pulse oximetry.  Given potential occupational exposure to coronavirus, will order send out COVID testing.  11:54 PM CXR negative. Patient ambulatory with sats of 98-100% on room air.   Initial Impression / Assessment and Plan / ED Course  I have reviewed the triage vital signs and the nursing notes.  Pertinent labs & imaging results that were available during my care of the patient were reviewed by me and considered in my medical decision making (see chart for details).        33 year old female presents to the emergency department for URI symptoms x 6 days with onset of extremity paresthesias tonight. Home pulse ox with readings of  86-90%, but noted to have normal oxygen sats since ED arrival.  Patient ambulatory in the department without hypoxia.  Her chest x-ray is reassuring.  Given potential occupational exposure, a COVID screen was sent.  This is pending.  No indication for admission or further inpatient work-up.  The patient has been instructed to continue with home supportive management.  Return precautions discussed and provided. Patient discharged in stable condition with no unaddressed concerns.  Kristina Stewart was evaluated in Emergency Department on 10/23/2018 for the symptoms described in the history of present illness. Kristina Stewart was evaluated in the context of the global COVID-19 pandemic, which necessitated consideration that the patient might be at risk for infection with the SARS-CoV-2 virus that causes COVID-19. Institutional protocols and algorithms that pertain to the evaluation of patients at risk for COVID-19 are in a state of rapid change based on information released by regulatory bodies including the CDC and federal and state organizations. These policies and algorithms were followed during the patient's care in the ED.  Vitals:   10/22/18 2215 10/22/18 2330  BP: (!) 156/100 122/76  Pulse: (!) 118 (!) 106  Resp: 18 18  Temp: 98.8 F (37.1 C)   TempSrc: Oral   SpO2: 99% 97%  Weight: 102.1 kg   Height: 5\' 5"  (1.651 m)     Final Clinical Impressions(s) / ED Diagnoses   Final diagnoses:  Upper respiratory tract infection, unspecified type    ED Discharge Orders    None       Antony MaduraHumes, Marielle Mantione, PA-C 10/23/18 0021    Alvira MondaySchlossman, Erin, MD 10/24/18 [phone]

## 2018-10-23 LAB — SARS CORONAVIRUS 2 BY RT PCR (HOSPITAL ORDER, PERFORMED IN ~~LOC~~ HOSPITAL LAB): SARS Coronavirus 2: NEGATIVE

## 2018-10-23 NOTE — Discharge Instructions (Addendum)
Continue use of home Mucinex without Sudafed.  You may also use Delsym for cough.  Remain quarantined pending the results of your COVID test.  If positive, you should quarantine for 14 days.  Follow-up with your primary doctor as needed.  You may return for new or concerning symptoms.

## 2018-10-23 NOTE — ED Notes (Signed)
Reviewed discharge paperwork with patient. Patient verbalizes understanding and has no questions at this time.  

## 2018-10-24 ENCOUNTER — Encounter: Payer: Self-pay | Admitting: Neurology

## 2018-10-24 NOTE — Telephone Encounter (Signed)
Spoke with pt to update her chart prior to vv tomorrow AM. Confirmed pt using 2 identifiers. She provided updates to her chart including PMH, meds, allergies, height/weight, etc. Pt aware she will get a call about 30 minutes prior to her visit. She will plan on joining the meeting at 7:45 AM. She verbalized appreciation for the call.

## 2018-10-24 NOTE — Addendum Note (Signed)
Addended by: Bertram Savin on: 10/24/2018 11:04 AM   Modules accepted: Orders

## 2018-10-25 ENCOUNTER — Other Ambulatory Visit: Payer: Self-pay

## 2018-10-25 ENCOUNTER — Encounter: Payer: Self-pay | Admitting: Neurology

## 2018-10-25 ENCOUNTER — Ambulatory Visit (INDEPENDENT_AMBULATORY_CARE_PROVIDER_SITE_OTHER): Payer: No Typology Code available for payment source | Admitting: Neurology

## 2018-10-25 DIAGNOSIS — M779 Enthesopathy, unspecified: Secondary | ICD-10-CM

## 2018-10-25 DIAGNOSIS — G5623 Lesion of ulnar nerve, bilateral upper limbs: Secondary | ICD-10-CM

## 2018-10-25 MED ORDER — METHYLPREDNISOLONE 4 MG PO TBPK: ORAL_TABLET | ORAL | 1 refills | Status: DC

## 2018-10-25 MED FILL — METHYLPREDNISOLONE 4 MG TBP: 4 | 6 days supply | Qty: 21 | Fill #0

## 2018-10-25 NOTE — Progress Notes (Signed)
[facility]    Provider:  Dr Lucia Gaskins Requesting Provider: Lenord Fellers Luanna Cole, MD Primary Care Provider:  Margaree Mackintosh, MD  CC:  Hand numbness  Virtual Visit via Video Note  I connected with patient on 10/25/18 at  8:00 AM EDT by a video enabled telemedicine application and verified that I am speaking with the correct person using two identifiers. Patient is at home and physician is in the office.   I discussed the limitations of evaluation and management by telemedicine and the availability of in person appointments. The patient expressed understanding and agreed to proceed.  Anson Fret, MD  HPI:  Kristina Stewart is a 33 y.o. female here as requested by Margaree Mackintosh, MD for numbness in the hands. 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 In January started doing many echos, all days long. Started with her left hand in the 4th and 5th digits, cramping and tingling. Then using her right hand she had the same issues. Her left hand improved but she is right hand dominant and she is having issues with the right hand. Also radiates ino the palm. Has symptoms when raising arm to blow dry hair. Worsening. She has neck pain but no radicular symptoms, more musculoskeletal pain. She bends her elbow. No muscle loss. Weakness, dropping things, difficulty opening jars. She has very tight traps and neck musculoskeletal symptoms. Not pain but discomfort. Affecting her work.   Reviewed notes, labs and imaging from outside physicians, which showed: personally reviewed images and agree with the following  Vertebrae: Previous suboccipital craniectomy. Straightening of the normal cervical lordosis. No focal bone finding otherwise.  Cord: No cord compression or primary cord lesion. No crowding of the cerebellar tonsils.  Posterior Fossa, vertebral arteries, paraspinal tissues: Previous suboccipital  craniectomy. Good decompression without evidence of crowding of the cerebellar tonsils or brainstem. No evidence of pseudomeningocele.  Disc levels:  Minimal, non-compressive disc bulges at C3-4, C4-5, C5-6 and C6-7. No stenosis of the canal or foramina.  IMPRESSION: Good appearance following suboccipital craniectomy. No evidence of postoperative complication.  Mild, non-compressive cervical disc bulges.  sars negative ANA neg rf <14    Review of Systems: Patient complains of symptoms per HPI as well as the following symptoms: cramps, neck pain, muscle pain. Pertinent negatives and positives per HPI. All others negative.   Social History   Socioeconomic History  . Marital status: Married    Spouse name: Not on file  . Number of children: 1  . Years of education: Not on file  . Highest education level: Master's degree (e.g., MA, MS, MEng, MEd, MSW, MBA)  Occupational History  . Not on file  Social Needs  . Financial resource strain: Not on file  . Food insecurity:    Worry: Not on file    Inability: Not on file  . Transportation needs:    Medical: Not on file    Non-medical: Not on file  Tobacco Use  . Smoking status: Never Smoker  . Smokeless tobacco: Never Used  Substance and Sexual Activity  . Alcohol use: No    Alcohol/week: 4.0 standard drinks    Types: 4 Glasses of wine per week  . Drug use: No  . Sexual activity: Yes    Birth control/protection: Inserts  Lifestyle  . Physical activity:    Days per week: Not on file    Minutes per  session: Not on file  . Stress: Not on file  Relationships  . Social connections:    Talks on phone: Not on file    Gets together: Not on file    Attends religious service: Not on file    Active member of club or organization: Not on file    Attends meetings of clubs or organizations: Not on file    Relationship status: Not on file  . Intimate partner violence:    Fear of current or ex partner: Not on file     Emotionally abused: Not on file    Physically abused: Not on file    Forced sexual activity: Not on file  Other Topics Concern  . Not on file  Social History Narrative   Lives at home with husband & son   Right handed   Caffeine: none      ** Merged History Encounter **        Family History  Problem Relation Age of Onset  . Hypertension Mother   . Thyroid disease Mother   . Hyperlipidemia Mother   . Hypertension Father   . Irritable bowel syndrome Father   . Hyperlipidemia Father   . GER disease Sister   . Fragile X syndrome Sister   . Alzheimer's disease Paternal Grandfather   . Diabetes Paternal Grandmother   . Fibromyalgia Paternal Grandmother   . Thyroid disease Maternal Grandmother        HYPOTHYROID  . Hypertension Maternal Grandmother   . Cancer Maternal Grandmother        LUNG  . Cancer Maternal Grandfather        LUNG  . CAD Maternal Uncle     Past Medical History:  Diagnosis Date  . Allergy   . Asthma   . IBS (irritable bowel syndrome)   . Obesity   . Seasonal allergic rhinitis   . Strep pharyngitis   . TMJ (temporomandibular joint syndrome)   . Vertigo     Patient Active Problem List   Diagnosis Date Noted  . Family history of fragile X syndrome 03/25/2016  . History of acute bronchitis with bronchospasm 04/26/2012  . MTHFR mutation (HCC) 04/01/2012  . 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 BRAIN SURGERY     craniotomy/arnold chiari malformation; craniotomy & laminectomy  . BRAIN SURGERY     craniotomy/arnold chiari malformation    Current Outpatient Medications  Medication Sig Dispense Refill  . albuterol (PROVENTIL HFA;VENTOLIN HFA) 108 (90 Base) MCG/ACT inhaler Inhale 1-2 puffs into the lungs every 6 (six) hours as needed for wheezing or shortness of breath. 6.7 g prn  . ALPRAZolam (XANAX) 0.25 MG tablet Take 1 tablet (0.25 mg total) by mouth at bedtime as needed for  anxiety. (Patient taking differently: Take 0.25 mg by mouth at bedtime as needed (vertigo). ) 30 tablet 0  . azelastine (OPTIVAR) 0.05 % ophthalmic solution Place 1 drop into both eyes 2 (two) times daily. 6 mL 12  . etonogestrel-ethinyl estradiol (NUVARING) 0.12-0.015 MG/24HR vaginal ring     . fluticasone (FLONASE) 50 MCG/ACT nasal spray Place 2 sprays into both nostrils daily. 16 g 6  . levocetirizine (XYZAL ALLERGY 24HR) 5 MG tablet Take 5 mg by mouth every evening.    . methylPREDNISolone (MEDROL DOSEPAK) 4 MG TBPK tablet follow package directions 21 tablet 1  . Multiple Vitamin (MULTIVITAMIN PO) Take by mouth.    Marland Kitchen VITAMIN D PO  Take 50 mcg by mouth daily.     No current facility-administered medications for this visit.     Allergies as of 10/25/2018 - Review Complete 10/24/2018  Allergen Reaction Noted  . Almond (diagnostic) Anaphylaxis 03/08/2017  . Other Anaphylaxis 04/01/2012  . Pistachio nut (diagnostic) Anaphylaxis 03/08/2017  . Latex Rash 01/12/2012    Vitals: LMP 10/14/2018 (Exact Date)  Last Weight:  Wt Readings from Last 1 Encounters:  10/22/18 225 lb (102.1 kg)   Last Height:   Ht Readings from Last 1 Encounters:  10/22/18 5\' 5"  (1.651 m)     Physical exam: Exam:    Physical exam: Exam: Gen: NAD, conversant      CV: attempted, Could not perform over Web Video. Denies palpitations or chest pain or SOB. VS: Breathing at a normal rate. Weight appears overweight. Not febrile. Eyes: Conjunctivae clear without exudates or hemorrhage  Neuro: Detailed Neurologic Exam  Speech:    Speech is normal; fluent and spontaneous with normal comprehension.  Cognition:    The patient is oriented to person, place, and time;     recent and remote memory intact;     language fluent;     normal attention, concentration,     fund of knowledge Cranial Nerves:    The pupils are equal, round, and reactive to light. Attempted, Cannot perform fundoscopic exam. Visual fields  are full to finger confrontation. Extraocular movements are intact.  The face is symmetric with normal sensation. The palate elevates in the midline. Hearing intact. Voice is normal. Shoulder shrug is normal. The tongue has normal motion without fasciculations.   Coordination:    Normal finger to nose  Gait:    Normal native gait  Motor Observation:   no involuntary movements noted. Tone:    Appears normal  Posture:    Posture is normal. normal erect    Strength:    Strength is anti-gravity and symmetric in the upper and lower limbs.      Sensation: intact to LT     Reflex Exam:  DTR's:    Attempted, Could not perform over Web Video   Toes: Attempted Could not perform over Web Video  Clonus:   Attempted, Could not perform over Web Video    +Phalen's maneuver elbows and wrists.  Assessment/Plan:  Lovely 33 year old patient with likely Ulnar neuropathy at the elbow. Possibly also CTS but her main complaint is in the Ulnar distrbution. Her mother has had multiple mononeuroathies will check for HNPP.  - elbow brace and conservative treatment - medrol dosepak - physical therapy - [facility], Garen LahLawrie Beardsley, Ulnar Neuropathy per patient preference (ask about dry needling) - emg/ncs (June) - HNPP - genetic testing, [facility] to be sent to her home - will send to gramig after testing (can discuss shoulder issues as well) - Discucsed etiology, pathophysiology, treatment options  Follow Up Instructions:     I discussed the assessment and treatment plan with the patient. The patient was provided an opportunity to ask questions and all were answered. The patient agreed with the plan and demonstrated an understanding of the instructions.   The patient was advised to call back or seek an in-person evaluation if the symptoms worsen or if the condition fails to improve as anticipated.  A total of 80 minutes was spent Video face-to-face(not in person) with this patient.  Over half this time was spent on counseling patient on the  1. Ulnar neuropathy of both upper extremities   2.  Inflammation around joint    diagnosis and different diagnostic and therapeutic options, counseling and coordination of care, risks ans benefits of management, compliance, or risk factor reduction and education.     Orders Placed This Encounter  Procedures  . Ambulatory referral to Physical Therapy  . NCV with EMG(electromyography)   Meds ordered this encounter  Medications  . methylPREDNISolone (MEDROL DOSEPAK) 4 MG TBPK tablet    Sig: follow package directions    Dispense:  21 tablet    Refill:  1    Cc: Baxley, Luanna Cole, MD,    Naomie Dean, MD  [facility] [address] [place], [place] [address]  Phone [phone] Fax [phone]

## 2018-10-26 ENCOUNTER — Telehealth: Payer: Self-pay | Admitting: *Deleted

## 2018-10-26 NOTE — Telephone Encounter (Signed)
The free genetic neuropathy test from [facility] does include the gene PMP22. Order form completed and ready for Dr. Trevor Mace signature.

## 2018-10-26 NOTE — Telephone Encounter (Signed)
Order form for genetic testing signed by Dr. Jaynee Eagles & faxed to [facility]. Request included for them to send the collection kit to pt's home, specimen of her choice. Received a receipt of confirmation.

## 2018-10-26 NOTE — Telephone Encounter (Signed)
-----  Message from Melvenia Beam, MD sent at 10/25/2018 10:06 AM EDT ----- Regarding: Send [facility] kit Can you please take a look at the [facility] free genetic panel and see if they check for the gene for Hereditary neuropathy with liability to pressure palsy (HNPP) the gene is PMP22. I am sure it is in there. And would you send a kit to this patient?

## 2018-10-31 ENCOUNTER — Encounter: Payer: Self-pay | Admitting: Neurology

## 2018-10-31 ENCOUNTER — Telehealth: Payer: Self-pay | Admitting: Neurology

## 2018-10-31 NOTE — Telephone Encounter (Signed)
Kristina Stewart, can you call physical therapy and see when they will be seeing patients again? This patient needs to be seen if possible. Please update patient on the PT status thanks.

## 2018-11-05 NOTE — Progress Notes (Signed)
Greater than 5 minutes, yet less than 10 minutes of time have been spent researching, coordinating, and implementing care for this patient today.  Thank you for the details you included in the comment boxes. Those details are very helpful in determining the best course of treatment for you and help us to provide the best care.  

## 2018-11-07 NOTE — Telephone Encounter (Signed)
Referral coordinator is out of the office today daisy she will be back in the office 11/08/2018 around 10:00.

## 2018-11-07 NOTE — Telephone Encounter (Signed)
I have called [facility] and left message about getting patient scheduled. Looks like they have called patient x 1 and left her a message x1 . [phone] . Spoke to [address] they are waiting on patient to call back. I will relay this to the patient.

## 2018-11-08 NOTE — Telephone Encounter (Signed)
Spoke to [place] and she will call patient and get her scheduled today.

## 2018-11-14 ENCOUNTER — Ambulatory Visit: Payer: No Typology Code available for payment source | Attending: Neurology | Admitting: Physical Therapy

## 2018-11-14 ENCOUNTER — Other Ambulatory Visit: Payer: Self-pay

## 2018-11-14 ENCOUNTER — Encounter: Payer: Self-pay | Admitting: Physical Therapy

## 2018-11-14 DIAGNOSIS — M79601 Pain in right arm: Secondary | ICD-10-CM | POA: Insufficient documentation

## 2018-11-14 DIAGNOSIS — M79602 Pain in left arm: Secondary | ICD-10-CM | POA: Insufficient documentation

## 2018-11-14 MED FILL — ETONOGESTREL-ETHINYL ESTRAD: 0.12-0.015 | 84 days supply | Qty: 3 | Fill #0

## 2018-11-14 NOTE — Therapy (Signed)
[facility] [address] [place], [place], [address] Phone: [phone]   Fax:  [phone]  Physical Therapy Evaluation  Patient Details  Name: Kristina Stewart MRN: [id number] Date of Birth: [birth year] Referring Provider (PT): Naomie Dean, MD   Encounter Date: 11/14/2018  PT End of Session - 11/14/18 1035    Visit Number  1    Number of Visits  12    Date for PT Re-Evaluation  12/26/18    Authorization Type  [employer] plan    PT Start Time  0935    PT Stop Time  1015    PT Time Calculation (min)  40 min    Activity Tolerance  Patient tolerated treatment well    Behavior During Therapy  [facility] for tasks assessed/performed       Past Medical History:  Diagnosis Date  . Allergy   . Asthma   . IBS (irritable bowel syndrome)   . Obesity   . Seasonal allergic rhinitis   . Strep pharyngitis   . TMJ (temporomandibular joint syndrome)   . Vertigo     Past Surgical History:  Procedure Laterality Date  . BRAIN SURGERY     craniotomy/arnold chiari malformation; craniotomy & laminectomy  . BRAIN SURGERY     craniotomy/arnold chiari malformation    There were no vitals filed for this visit.   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 ulnar neuropathy diagnosis. She reports multi-year history of symptoms but notes exacerbation this past January with swicth to doing more echos with different transducer size. NCS ordered but not comppleted as of eval due to backlog in scheduling from Covid-19. Symptoms worse on right side, generally involve numbness and tingling more than pain (pt. does have intermittent elbow and shoulder pain). Pt. reports difficulty with gripping activities, work duties, activities such as opening jars. Issues with neck/postural tension also noted.    Pertinent History  TMJ    Limitations  House hold activities;Lifting;Other (comment)    difficulty gripping with cooking, opening jars, work duties   Currently in Pain?  Yes    Pain Score  5     Pain Location  Elbow    Pain Orientation  Right;Medial    Pain Descriptors / Indicators  Dull;Aching    Pain Type  Chronic pain    Pain Radiating Towards  shoulder and elnar aspect of forarm, hand with associated parasthesias    Pain Onset  More than a month ago    Pain Frequency  Intermittent    Aggravating Factors   gripping activities, hand use    Pain Relieving Factors  rest    Effect of Pain on Daily Activities  limits tolerance and ability for gripping activities, work duties, fine Company secretary PT Assessment - 11/14/18 0001      Assessment   Medical Diagnosis  Bilateral ulnar neuropathy    Referring Provider (PT)  Naomie Dean, MD    Onset Date/Surgical Date  07/16/18   estimated for date of symptom exacerbation   Hand Dominance  Right      Precautions   Precautions  None      Restrictions   Weight Bearing Restrictions  No      Balance Screen   Has the patient fallen in the past 6 months  No      Prior  Function   Level of Independence  Independent with basic ADLs      Cognition   Overall Cognitive Status  Within Functional Limits for tasks assessed      Observation/Other Assessments   Focus on Therapeutic Outcomes (FOTO)   35% limited      Sensation   Additional Comments  Decreased sensation to light touch on left at C8 and T1 dermatomal regions, (+) Tinels at cubital tunnel bilat.      Posture/Postural Control   Posture Comments  Mild forward head, rounded shoulders bilat.      ROM / Strength   AROM / PROM / Strength  AROM;Strength      AROM   Overall AROM Comments  Bilat. shoulder, elbow, wrist and hand AROM grossly WFL bilat.    AROM Assessment Site  Cervical    Cervical Flexion  40    Cervical Extension  30    Cervical - Right Side Bend  30    Cervical - Left Side Bend  30    Cervical - Right Rotation  60    Cervical - Left  Rotation  50      Strength   Overall Strength Comments  Bilat. UE grossly 5/5, R grip 40.66 lbs., L grip 40 lbs. both average of 3 attempts with grip dynamonometer      Palpation   Palpation comment  Tight upper traps with TTP bilat., no significant findings posterior scapular or tricep region bilat., symptom reproduction with palpation medial wrist flexors particularly flexor carpi ulnaris bilat.      Special Tests   Other special tests  Froment's (-), ULTT (+) bilat. for ulnar nerve                Objective measurements completed on examination: See above findings.      [facility] Adult PT Treatment/Exercise - 11/14/18 0001      Exercises   Exercises  Elbow      Elbow Exercises   Other elbow exercises  Brief HEP instruction/handout review ulnar nerve floss, chin tucks, scapular retraction and upper trap stretches             PT Education - 11/14/18 1035    Education Details  HEP, POC, symptom etiology, dry needling    Person(s) Educated  Patient    Methods  Explanation;Demonstration;Verbal cues;Handout    Comprehension  Returned demonstration          PT Long Term Goals - 11/14/18 1040      PT LONG TERM GOAL #1   Title  Pt will be independent with advanced HEP    Baseline  basic HEP at eval, will update prn    Time  6    Period  Weeks    Status  New    Target Date  12/26/18      PT LONG TERM GOAL #2   Title  Improve FOTO outcome to 27% or less impairment    Baseline  35% limited    Time  6    Period  Weeks    Status  New      PT LONG TERM GOAL #3   Title  Increase left cervical rotation 5-10 deg to improve ability to turn head while driving    Baseline  50 deg    Time  6    Period  Weeks    Status  New    Target Date  12/26/18      PT LONG TERM GOAL #  4   Title  R grip 40 lbs. or greater at 3rd rep of grip dynamonometer testing to improve grip endurance for work duties    Baseline  starts >40 lbs. but fatigues to 30 lb. or less at 3rd rep     Time  6    Period  Weeks    Status  New    Target Date  12/26/18      PT LONG TERM GOAL #5   Title  Perform gripping activities for cooking, opening jars, work duties with 50% or greater symptom reduction    Time  6    Period  Weeks    Status  New    Target Date  12/26/18             Plan - 11/14/18 1036    Clinical Impression Statement  Pt. presents with bilat. UE parasthesias and pain in ulnar nerve distribution consistent with ulnar neuropathy. Potential contribution from cervical region tension and local soft tissue restriction to symptom presentation. Pt. would benefit from PT to address current associated functional limitations.    Personal Factors and Comorbidities  Time since onset of injury/illness/exacerbation    Examination-Activity Limitations  Lift;Carry   gripping activities, work duties   Examination-Participation Restrictions  Meal Prep    Stability/Clinical Decision Making  Evolving/Moderate complexity    Clinical Decision Making  Moderate    Rehab Potential  Good    PT Frequency  2x / week    PT Duration  6 weeks    PT Treatment/Interventions  ADLs/Self Care Home Management;Cryotherapy;Ultrasound;Moist Heat;Iontophoresis 4mg /ml Dexamethasone;Electrical Stimulation;Manual techniques;Dry needling;Therapeutic exercise;Therapeutic activities;Patient/family education;Taping;Neuromuscular re-education    PT Next Visit Plan  Dry needle upper traps, cervical region and flexor carpi ulnaris, cervical/upper trap stretches and STM, nerve glides, postural strengthening    PT Home Exercise Plan  ulnar nerve floss, chin tucks, scapular retraction, upper trap stretch    Consulted and Agree with Plan of Care  Patient       Patient will benefit from skilled therapeutic intervention in order to improve the following deficits and impairments:  Pain, Impaired sensation, Impaired UE functional use, Decreased strength, Postural dysfunction  Visit Diagnosis: Pain in right  arm  Pain in left arm     Problem List Patient Active Problem List   Diagnosis Date Noted  . Family history of fragile X syndrome 03/25/2016  . History of acute bronchitis with bronchospasm 04/26/2012  . MTHFR mutation (HCC) 04/01/2012  . 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

## 2018-11-14 NOTE — Patient Instructions (Signed)

## 2018-11-28 ENCOUNTER — Other Ambulatory Visit: Payer: Self-pay

## 2018-11-28 ENCOUNTER — Ambulatory Visit: Payer: No Typology Code available for payment source | Attending: Neurology | Admitting: Physical Therapy

## 2018-11-28 DIAGNOSIS — M79601 Pain in right arm: Secondary | ICD-10-CM | POA: Diagnosis present

## 2018-11-28 DIAGNOSIS — M79602 Pain in left arm: Secondary | ICD-10-CM | POA: Diagnosis present

## 2018-11-28 NOTE — Therapy (Signed)
[facility] [address] [place], [place], [address] Phone: [phone]   Fax:  [phone]  Physical Therapy Treatment  Patient Details  Name: Kristina Stewart MRN: [id number] Date of Birth: Mar 07, 1986 Referring Provider (PT): Naomie DeanAntonia Ahern, MD   Encounter Date: 11/28/2018  PT End of Session - 11/28/18 0755    Visit Number  2    Number of Visits  12    Date for PT Re-Evaluation  12/26/18    Authorization Type  [facility] [employer] plan    PT Start Time  0758    PT Stop Time  0843   time spent dry needling not included in timed minutes   PT Time Calculation (min)  45 min    Activity Tolerance  Patient tolerated treatment well    Behavior During Therapy  [facility] assessed/performed       Past Medical History:  Diagnosis Date  . Allergy   . Asthma   . IBS (irritable bowel syndrome)   . Obesity   . Seasonal allergic rhinitis   . Strep pharyngitis   . TMJ (temporomandibular joint syndrome)   . Vertigo     Past Surgical History:  Procedure Laterality Date  . BRAIN SURGERY     craniotomy/arnold chiari malformation; craniotomy & laminectomy  . BRAIN SURGERY     craniotomy/arnold chiari malformation    There were no vitals filed for this visit.  Subjective Assessment - 11/28/18 0759    Subjective  Symptoms about the same since eval. Mild discomfort in palm on right with ulnar nerve glide HEP.    Currently in Pain?  Yes    Pain Score  2     Pain Location  Elbow    Pain Orientation  Right;Medial    Pain Descriptors / Indicators  Dull;Aching    Pain Type  Chronic pain    Pain Onset  More than a month ago    Pain Frequency  Intermittent    Aggravating Factors   gripping activities, hand use    Pain Relieving Factors  rest    Effect of Pain on Daily Activities  limits tolerance and ability for gripping activities, work duties                       [facility] Adult PT Treatment/Exercise - 11/28/18 0001      Exercises   Exercises  Neck;Shoulder;Elbow      Neck Exercises: Theraband   Rows  15 reps;Blue      Shoulder Exercises: Supine   Horizontal ABduction  Strengthening;Both;15 reps    Theraband Level (Shoulder Horizontal ABduction)  Level 3 (Green)   band wrapped around wrist     Shoulder Exercises: Seated   External Rotation  Strengthening;Both;15 reps    Theraband Level (Shoulder External Rotation)  Level 3 (Green)    External Rotation Limitations  band wrapped around wrist      Manual Therapy   Manual Therapy  Soft tissue mobilization    Soft tissue mobilization  STM wrist flexors ulnar aspect bilat., STM bilat. upper traps, cervical paraspinals      Neck Exercises: Stretches   Upper Trapezius Stretch  Right;Left;3 reps;20 seconds       Trigger Point Dry Needling - 11/28/18 0001    Consent Given?  Yes    Education Handout Provided  Yes   provided at eval visit   Muscles Treated Head and Neck  Upper trapezius;Splenius capitus  Muscles Treated Wrist/Hand  Flexor carpi ulnaris    Dry Needling Comments  needling done bilat., 32 gauge 40 mm needles used, semispinalis needled at C5 region           PT Education - 11/28/18 0839    Education Details  POC, HEP    Person(s) Educated  Patient    Methods  Explanation    Comprehension  Verbalized understanding          PT Long Term Goals - 11/14/18 1040      PT LONG TERM GOAL #1   Title  Pt will be independent with advanced HEP    Baseline  basic HEP at eval, will update prn    Time  6    Period  Weeks    Status  New    Target Date  12/26/18      PT LONG TERM GOAL #2   Title  Improve FOTO outcome to 27% or less impairment    Baseline  35% limited    Time  6    Period  Weeks    Status  New      PT LONG TERM GOAL #3   Title  Increase left cervical rotation 5-10 deg to improve ability to turn head while driving    Baseline  50 deg    Time  6    Period  Weeks    Status  New    Target Date  12/26/18      PT  LONG TERM GOAL #4   Title  R grip 40 lbs. or greater at 3rd rep of grip dynamonometer testing to improve grip endurance for work duties    Baseline  starts >40 lbs. but fatigues to 30 lb. or less at 3rd rep    Time  6    Period  Weeks    Status  New    Target Date  12/26/18      PT LONG TERM GOAL #5   Title  Perform gripping activities for cooking, opening jars, work duties with 50% or greater symptom reduction    Time  6    Period  Weeks    Status  New    Target Date  12/26/18            Plan - 11/28/18 0839    Clinical Impression Statement  Limited symptom change since eval but given symptom duration expect may take more time to note tx. results. Mild discomfort with dry needling to wrist flexor region but overall session well-tolerated. Functional limitations for gripping activities ongoing/needling continued PT to address.    Stability/Clinical Decision Making  Evolving/Moderate complexity    Clinical Decision Making  Moderate    PT Frequency  2x / week    PT Duration  6 weeks    PT Treatment/Interventions  ADLs/Self Care Home Management;Cryotherapy;Ultrasound;Moist Heat;Iontophoresis /ml Dexamethasone;Electrical Stimulation;Manual techniques;Dry needling;Therapeutic exercise;Therapeutic activities;Patient/family education;Taping;Neuromuscular re-education    PT Next Visit Plan  Dry needle upper traps, cervical region and flexor carpi ulnaris, cervical/upper trap stretches and STM, nerve glides, postural strengthening    PT Home Exercise Plan  ulnar nerve floss, chin tucks, scapular retraction, upper trap stretch    Consulted and Agree with Plan of Care  Patient       Patient will benefit from skilled therapeutic intervention in order to improve the following deficits and impairments:  Pain, Impaired sensation, Impaired UE functional use, Decreased strength, Postural dysfunction  Visit Diagnosis: Pain in right arm  Pain in left arm     Problem List Patient Active  Problem List   Diagnosis Date Noted  . Family history of fragile X syndrome 03/25/2016  . History of acute bronchitis with bronchospasm 04/26/2012  . MTHFR mutation (HCC) 04/01/2012  . 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 [place], [place], [address] Phone: [phone]   Fax:  [phone]  Name: Kristina Stewart MRN: [id number] Date of Birth: December 02, 1985

## 2018-11-30 ENCOUNTER — Ambulatory Visit: Payer: No Typology Code available for payment source | Admitting: Physical Therapy

## 2018-12-05 ENCOUNTER — Telehealth: Payer: Self-pay | Admitting: *Deleted

## 2018-12-05 ENCOUNTER — Other Ambulatory Visit: Payer: Self-pay

## 2018-12-05 ENCOUNTER — Ambulatory Visit: Payer: No Typology Code available for payment source | Admitting: Physical Therapy

## 2018-12-05 DIAGNOSIS — M79601 Pain in right arm: Secondary | ICD-10-CM | POA: Diagnosis not present

## 2018-12-05 DIAGNOSIS — M79602 Pain in left arm: Secondary | ICD-10-CM

## 2018-12-05 NOTE — Telephone Encounter (Signed)
Called patient to schedule a NCV/EMG. Patient did not answer. I LVM requesting she call back.

## 2018-12-05 NOTE — Therapy (Signed)
[place], [place], [address] Phone: [phone]   Fax:  [phone]  Physical Therapy Treatment  Patient Details  Name: Kristina Stewart MRN: [id number] Date of Birth: 04/08/1986 Referring Provider (PT): Sarina Ill, MD   Encounter Date: 12/05/2018  PT End of Session - 12/05/18 0759    Visit Number  3    Number of Visits  12    Date for PT Re-Evaluation  12/26/18    Authorization Type  [facility] [employer] plan    PT Start Time  7322    PT Stop Time  0852   time spent dry needling not included in direct minutes   PT Time Calculation (min)  54 min    Activity Tolerance  Patient tolerated treatment well    Behavior During Therapy  [facility] for tasks assessed/performed       Past Medical History:  Diagnosis Date  . Allergy   . Asthma   . IBS (irritable bowel syndrome)   . Obesity   . Seasonal allergic rhinitis   . Strep pharyngitis   . TMJ (temporomandibular joint syndrome)   . Vertigo     Past Surgical History:  Procedure Laterality Date  . BRAIN SURGERY     craniotomy/arnold chiari malformation; craniotomy & laminectomy  . BRAIN SURGERY     craniotomy/arnold chiari malformation    There were no vitals filed for this visit.  Subjective Assessment - 12/05/18 0759    Subjective  Pt. reports had exacerbation of symptoms after exercising late last week doing pushups. She reports had "achy" pain in thenar region of right hand along with ulnar aspect of forearm. She does note prior to this had some improvement with neck ROM after last session.    Currently in Pain?  Yes    Pain Score  7     Pain Location  Elbow    Pain Orientation  Right;Medial    Pain Descriptors / Indicators  Aching    Pain Type  Chronic pain    Pain Radiating Towards  thenar aspect of right hand    Pain Onset  More than a month ago    Pain Frequency  Constant    Aggravating Factors   right hand use and gripping activities    Pain  Relieving Factors  rest    Effect of Pain on Daily Activities  limited tolerance gripping activities                       [facility] Adult PT Treatment/Exercise - 12/05/18 0001      Manual Therapy   Manual Therapy  Manual Traction    Soft tissue mobilization  STM upper traps/levator bilat. and right wrist flexors with cross friction STM near medial epicondyle    Manual Traction  Gentle cervical manual traction and suboccipital release      Neck Exercises: Stretches   Upper Trapezius Stretch  Right;Left;3 reps;20 seconds    Neural Stretch  right wrist flexor manual stretch 3x30 sec, also performed right median nerve glide exercise with hand x 2 sequences of 3-5 sec holds ea. position    Other Neck Stretches  HEP instruction and practice variations wrist flexor stretches with prayer stretch, self manual stretch vs. use of hands on mat/counter       Trigger Point Dry Needling - 12/05/18 0001    Consent Given?  Yes    Muscles Treated Head and Neck  Upper trapezius;Semispinalis capitus  semispinalis C4-6 region bilat.   Muscles Treated Wrist/Hand  Flexor carpi ulnaris    Dry Needling Comments  muscle needled bilat. with 32 gauge 40 mm needles, upper traps/cervical needling in sidelying, flexor carpi ulnaris needles left in situ during performance cervical manual traction and suboccipital release           PT Education - 12/05/18 0946    Education Details  HEP updates, stretches, POC    Person(s) Educated  Patient    Methods  Explanation    Comprehension  Verbalized understanding          PT Long Term Goals - 11/14/18 1040      PT LONG TERM GOAL #1   Title  Pt will be independent with advanced HEP    Baseline  basic HEP at eval, will update prn    Time  6    Period  Weeks    Status  New    Target Date  12/26/18      PT LONG TERM GOAL #2   Title  Improve FOTO outcome to 27% or less impairment    Baseline  35% limited    Time  6    Period  Weeks    Status   New      PT LONG TERM GOAL #3   Title  Increase left cervical rotation 5-10 deg to improve ability to turn head while driving    Baseline  50 deg    Time  6    Period  Weeks    Status  New    Target Date  12/26/18      PT LONG TERM GOAL #4   Title  R grip 40 lbs. or greater at 3rd rep of grip dynamonometer testing to improve grip endurance for work duties    Baseline  starts >40 lbs. but fatigues to 30 lb. or less at 3rd rep    Time  6    Period  Weeks    Status  New    Target Date  12/26/18      PT LONG TERM GOAL #5   Title  Perform gripping activities for cooking, opening jars, work duties with 50% or greater symptom reduction    Time  6    Period  Weeks    Status  New    Target Date  12/26/18            Plan - 12/05/18 0946    Clinical Impression Statement  Mild improvement in terms of decreased cervical tension but UE neural symptoms ongoing. Thenar eminence symptoms consistent with median nerve involvement (pt. reports carpal tunnel suspected by MD in addition to ulnar nerve involvement). For now plan continue visits as expect results could take several treatments but discussed with pt. plan to contact MD re: possible NCS/EMG.    Stability/Clinical Decision Making  Evolving/Moderate complexity    Clinical Decision Making  Moderate    Rehab Potential  Good    PT Frequency  2x / week    PT Duration  6 weeks    PT Treatment/Interventions  ADLs/Self Care Home Management;Cryotherapy;Ultrasound;Moist Heat;Iontophoresis 4mg /ml Dexamethasone;Electrical Stimulation;Manual techniques;Dry needling;Therapeutic exercise;Therapeutic activities;Patient/family education;Taping;Neuromuscular re-education    PT Next Visit Plan  Dry needle upper traps, cervical region and flexor carpi ulnaris, cervical/upper trap stretches and STM, nerve glides, postural strengthening    PT Home Exercise Plan  ulnar nerve floss, chin tucks, scapular retraction, upper trap stretch, wrist flexor stretches  and median nerve glide  Consulted and Agree with Plan of Care  Patient       Patient will benefit from skilled therapeutic intervention in order to improve the following deficits and impairments:  Pain, Impaired sensation, Impaired UE functional use, Decreased strength, Postural dysfunction  Visit Diagnosis: Pain in right arm  Pain in left arm     Problem List Patient Active Problem List   Diagnosis Date Noted  . Family history of fragile X syndrome 03/25/2016  . History of acute bronchitis with bronchospasm 04/26/2012  . MTHFR mutation (HCC) 04/01/2012  . 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

## 2018-12-05 NOTE — Telephone Encounter (Signed)
Patient reached out in [place]. She would like to schedule EMG/NCS.

## 2018-12-08 ENCOUNTER — Ambulatory Visit: Payer: No Typology Code available for payment source | Admitting: Physical Therapy

## 2018-12-09 ENCOUNTER — Other Ambulatory Visit: Payer: Self-pay

## 2018-12-09 ENCOUNTER — Encounter: Payer: Self-pay | Admitting: Physical Therapy

## 2018-12-09 ENCOUNTER — Ambulatory Visit: Payer: No Typology Code available for payment source | Admitting: Physical Therapy

## 2018-12-09 DIAGNOSIS — M79601 Pain in right arm: Secondary | ICD-10-CM | POA: Diagnosis not present

## 2018-12-09 DIAGNOSIS — M79602 Pain in left arm: Secondary | ICD-10-CM

## 2018-12-09 NOTE — Therapy (Signed)
[facility] [address] [place], [place], [address] Phone: [phone]   Fax:  [phone]  Physical Therapy Treatment  Patient Details  Name: Kristina Stewart MRN: [id number] Date of Birth: 1986/02/10 Referring Provider (PT): Naomie DeanAntonia Ahern, MD   Encounter Date: 12/09/2018  PT End of Session - 12/09/18 1101    Visit Number  4    Number of Visits  12    Date for PT Re-Evaluation  12/26/18    Authorization Type  [employer] plan    PT Start Time  1003    PT Stop Time  1051   time spent dry needling not included in direct tx. minutes   PT Time Calculation (min)  48 min    Activity Tolerance  Patient tolerated treatment well    Behavior During Therapy  [facility] for tasks assessed/performed       Past Medical History:  Diagnosis Date  . Allergy   . Asthma   . IBS (irritable bowel syndrome)   . Obesity   . Seasonal allergic rhinitis   . Strep pharyngitis   . TMJ (temporomandibular joint syndrome)   . Vertigo     Past Surgical History:  Procedure Laterality Date  . BRAIN SURGERY     craniotomy/arnold chiari malformation; craniotomy & laminectomy  . BRAIN SURGERY     craniotomy/arnold chiari malformation    There were no vitals filed for this visit.  Subjective Assessment - 12/09/18 1004    Subjective  Pt. contacted Dr. Trevor MaceAhern's office re: NCS-scheduling of these is pending call back. She reports mild improvement particularly iwth neck tension but continues with moderate right elbow pain.    Currently in Pain?  Yes    Pain Score  4     Pain Location  Elbow    Pain Orientation  Right;Medial    Pain Descriptors / Indicators  Aching    Pain Type  Chronic pain    Pain Onset  More than a month ago    Pain Frequency  Constant    Aggravating Factors   right hand use and gripping activities    Pain Relieving Factors  rest    Effect of Pain on Daily Activities  limits ability fine motor skills and gripping activities for work  and ADLs         [facility] - 12/09/18 0001      AROM   Cervical Flexion  35    Cervical Extension  35    Cervical - Right Side Bend  45    Cervical - Left Side Bend  37    Cervical - Right Rotation  75    Cervical - Left Rotation  65                   [facility] Adult PT Treatment/Exercise - 12/09/18 0001      Shoulder Exercises: Seated   Retraction  AROM;Both;20 reps      Manual Therapy   Manual Therapy  Joint mobilization;Neural Stretch    Manual therapy comments  skilled palpation with TPDN    Joint Mobilization  cervical lateral glides grade I-III    Soft tissue mobilization  STM bilat. upper traps, levator    Manual Traction  cervical manual traction and suboccipital releae    Neural Stretch  manual nerve glides for right ulnar and median nerve      Neck Exercises: Stretches   Upper Trapezius Stretch  Right;Left;3 reps;30 seconds  Corner Stretch  3 reps;20 seconds   in doorway   Other Neck Stretches  brief HEP discussion/review nerve glides       Trigger Point Dry Needling - 12/09/18 0001    Consent Given?  Yes    Muscles Treated Head and Neck  Upper trapezius;Semispinalis capitus    Muscles Treated Wrist/Hand  Flexor carpi ulnaris    Dry Needling Comments  muscle needles bilat. with 30 gauge 40 mm needles, flexor carpi ulnaris needles left in situ during cervical manual work           PT Education - 12/09/18 1059    Education Details  HEP, POC    Person(s) Educated  Patient    Methods  Explanation    Comprehension  Verbalized understanding          PT Long Term Goals - 12/09/18 1105      PT LONG TERM GOAL #1   Title  Pt will be independent with advanced HEP    Baseline  basic HEP at eval, will update prn    Time  6    Period  Weeks    Status  On-going      PT LONG TERM GOAL #2   Title  Improve FOTO outcome to 27% or less impairment    Baseline  35% limited at eval, not retested today    Time  6    Period  Weeks     Status  On-going      PT LONG TERM GOAL #3   Title  Increase left cervical rotation 5-10 deg to improve ability to turn head while driving    Baseline  65 deg    Time  6    Period  Weeks    Status  Achieved      PT LONG TERM GOAL #4   Title  R grip 40 lbs. or greater at 3rd rep of grip dynamonometer testing to improve grip endurance for work duties    Baseline  starts >40 lbs. but fatigues to 30 lb. or less at 3rd rep (not tested since eval)    Time  6    Period  Weeks    Status  On-going      PT LONG TERM GOAL #5   Title  Perform gripping activities for cooking, opening jars, work duties with 50% or greater symptom reduction    Baseline  goal ongoing, still with limitations for this    Time  6    Period  Weeks    Status  On-going            Plan - 12/09/18 1102    Clinical Impression Statement  Significant improvement in cervical ROM from baseline status but fair/limited progress with UE nerve symptoms with continued limitations for gripping activities, Plan continue POC for now given mild improvements while awaiting testing with NCS.    Stability/Clinical Decision Making  Evolving/Moderate complexity    Clinical Decision Making  Moderate    Rehab Potential  Good    PT Frequency  2x / week    PT Duration  6 weeks    PT Treatment/Interventions  ADLs/Self Care Home Management;Cryotherapy;Ultrasound;Moist Heat;Iontophoresis 4mg /ml Dexamethasone;Electrical Stimulation;Manual techniques;Dry needling;Therapeutic exercise;Therapeutic activities;Patient/family education;Taping;Neuromuscular re-education    PT Next Visit Plan  Dry needle upper traps, cervical region and flexor carpi ulnaris, cervical/upper trap stretches and STM, nerve glides, postural strengthening    PT Home Exercise Plan  ulnar nerve floss, chin tucks, scapular retraction, upper trap  stretch, wrist flexor stretches and median nerve glide    Consulted and Agree with Plan of Care  Patient       Patient will  benefit from skilled therapeutic intervention in order to improve the following deficits and impairments:  Pain, Impaired sensation, Impaired UE functional use, Decreased strength, Postural dysfunction  Visit Diagnosis: Pain in right arm  Pain in left arm     Problem List Patient Active Problem List   Diagnosis Date Noted  . Family history of fragile X syndrome 03/25/2016  . History of acute bronchitis with bronchospasm 04/26/2012  . MTHFR mutation ([place]) 04/01/2012  . 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

## 2018-12-12 ENCOUNTER — Encounter: Payer: Self-pay | Admitting: Physical Therapy

## 2018-12-12 ENCOUNTER — Ambulatory Visit: Payer: No Typology Code available for payment source | Admitting: Physical Therapy

## 2018-12-12 ENCOUNTER — Other Ambulatory Visit: Payer: Self-pay

## 2018-12-12 DIAGNOSIS — M79602 Pain in left arm: Secondary | ICD-10-CM

## 2018-12-12 DIAGNOSIS — M79601 Pain in right arm: Secondary | ICD-10-CM | POA: Diagnosis not present

## 2018-12-12 NOTE — Therapy (Signed)
[facility] [address] [place], [place], [address] Phone: [phone]   Fax:  [phone]  Physical Therapy Treatment  Patient Details  Name: Kristina FillMichelle A Stewart MRN: [id number] Date of Birth: April 04, 1986 Referring Provider (PT): Naomie DeanAntonia Ahern, MD   Encounter Date: 12/12/2018  PT End of Session - 12/12/18 0959    Visit Number  5    Number of Visits  12    Date for PT Re-Evaluation  12/26/18    Authorization Type  [employer] plan    PT Start Time  0957    PT Stop Time  1045   time for dry needling and estim not included in direct tx. time   PT Time Calculation (min)  48 min       Past Medical History:  Diagnosis Date  . Allergy   . Asthma   . IBS (irritable bowel syndrome)   . Obesity   . Seasonal allergic rhinitis   . Strep pharyngitis   . TMJ (temporomandibular joint syndrome)   . Vertigo     Past Surgical History:  Procedure Laterality Date  . BRAIN SURGERY     craniotomy/arnold chiari malformation; craniotomy & laminectomy  . BRAIN SURGERY     craniotomy/arnold chiari malformation    There were no vitals filed for this visit.  Subjective Assessment - 12/12/18 0952    Subjective  Pt. reports feels like slowly getting better including with arm symptoms but noting some pain along distal radial aspect of right wrist along with ongoing thenar eminence discomfort.    Currently in Pain?  Yes    Pain Score  5     Pain Location  Elbow    Pain Orientation  Right;Medial    Pain Descriptors / Indicators  Aching    Pain Type  Chronic pain    Pain Radiating Towards  thenar aspect if hand and along distal radius    Pain Onset  More than a month ago    Pain Frequency  Constant    Aggravating Factors   right hand use and gripping activities    Pain Relieving Factors  rest    Effect of Pain on Daily Activities  limits ability RUE use for fine motor skills, work duties, gripping activities.         [facility] PT  Assessment - 12/12/18 0001      Special Tests   Other special tests  (+) Finkelstein's test on right                   [facility] Treatment/Exercise - 12/12/18 0001      Manual Therapy   Manual therapy comments  skilled palpation of trigger points    Joint Mobilization  cervical lateral glides grade I-III    Soft tissue mobilization  STM bilat. upper trapezius right>left side    Manual Traction  gentle cervical manual traction and suboccipital release      Neck Exercises: Stretches   Upper Trapezius Stretch  Right;Left;3 reps;30 seconds    Levator Stretch  Right;Left;3 reps;30 seconds    Other Neck Stretches  DeQuervain's/Finklestein stretch x 10, Right median nerve glide x 10       Trigger Point Dry Needling - 12/12/18 0001    Consent Given?  Yes    Muscles Treated Head and Neck  Upper trapezius;Semispinalis capitus    Muscles Treated Wrist/Hand  Flexor carpi ulnaris;Opponens pollicis    Dry Needling Comments  muscles needled with 32  gauge 40 mm needles, cervical/upper trap muscles needles bilat., wrist/hand only on right    Electrical Stimulation Performed with Dry Needling  Yes    E-stim with Dry Needling Details  TENS 100 pps to right medial elbow and thenar region           PT Education - 12/12/18 0959    Education Details  estim    Person(s) Educated  Patient    Methods  Explanation    Comprehension  Verbalized understanding          PT Long Term Goals - 12/09/18 1105      PT LONG TERM GOAL #1   Title  Pt will be independent with advanced HEP    Baseline  basic HEP at eval, will update prn    Time  6    Period  Weeks    Status  On-going      PT LONG TERM GOAL #2   Title  Improve FOTO outcome to 27% or less impairment    Baseline  35% limited at eval, not retested today    Time  6    Period  Weeks    Status  On-going      PT LONG TERM GOAL #3   Title  Increase left cervical rotation 5-10 deg to improve ability to turn head while  driving    Baseline  65 deg    Time  6    Period  Weeks    Status  Achieved      PT LONG TERM GOAL #4   Title  R grip 40 lbs. or greater at 3rd rep of grip dynamonometer testing to improve grip endurance for work duties    Baseline  starts >40 lbs. but fatigues to 30 lb. or less at 3rd rep (not tested since eval)    Time  6    Period  Weeks    Status  On-going      PT LONG TERM GOAL #5   Title  Perform gripping activities for cooking, opening jars, work duties with 50% or greater symptom reduction    Baseline  goal ongoing, still with limitations for this    Time  6    Period  Weeks    Status  On-going            Plan - 12/12/18 0959    Clinical Impression Statement  Fair progress but pt. has been noting improvements with arm pain in addition to previous gains with cervical region. Trial estim today to assist with pain relief with fair tolerance. Still with ongoing limitations for prolonged use right UE for gripping activities.    Stability/Clinical Decision Making  Evolving/Moderate complexity    Clinical Decision Making  Moderate    Rehab Potential  Good    PT Frequency  2x / week    PT Duration  6 weeks    PT Treatment/Interventions  ADLs/Self Care Home Management;Cryotherapy;Ultrasound;Moist Heat;Iontophoresis 4mg /ml Dexamethasone;Electrical Stimulation;Manual techniques;Dry needling;Therapeutic exercise;Therapeutic activities;Patient/family education;Taping;Neuromuscular re-education    PT Next Visit Plan  Check response estim and further include as found beneficial, dry needle upper traps, cervical region and flexor carpi ulnaris, cervical/upper trap stretches and STM, nerve glides, postural strengthening    PT Home Exercise Plan  ulnar nerve floss, chin tucks, scapular retraction, upper trap stretch, wrist flexor stretches and median nerve glide    Consulted and Agree with Plan of Care  Patient       Patient will benefit from skilled  therapeutic intervention in order  to improve the following deficits and impairments:  Pain, Impaired sensation, Impaired UE functional use, Decreased strength, Postural dysfunction  Visit Diagnosis: Pain in right arm  Pain in left arm     Problem List Patient Active Problem List   Diagnosis Date Noted  . Family history of fragile X syndrome 03/25/2016  . History of acute bronchitis with bronchospasm 04/26/2012  . MTHFR mutation ([place]) 04/01/2012  . 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 [place], [place], [address] Phone: [phone]   Fax:  [phone]  Name: Kristina Stewart MRN: [id number] Date of Birth: 1986-06-25

## 2018-12-14 ENCOUNTER — Other Ambulatory Visit: Payer: Self-pay

## 2018-12-14 ENCOUNTER — Encounter: Payer: Self-pay | Admitting: Physical Therapy

## 2018-12-14 ENCOUNTER — Ambulatory Visit: Payer: No Typology Code available for payment source | Admitting: Physical Therapy

## 2018-12-14 DIAGNOSIS — M79601 Pain in right arm: Secondary | ICD-10-CM

## 2018-12-14 DIAGNOSIS — M79602 Pain in left arm: Secondary | ICD-10-CM

## 2018-12-14 NOTE — Therapy (Signed)
[facility] [address] [place], [place], [address] Phone: [phone]   Fax:  [phone]  Physical Therapy Treatment  Patient Details  Name: Kristina Stewart MRN: [id number] Date of Birth: 10/16/1985 Referring Provider (PT): Antonia Ahern, MD   Encounter Date: 12/14/2018  PT End of Session - 12/14/18 1802    Visit Number  6    Number of Visits  12    Date for PT Re-Evaluation  12/26/18    Authorization Type  [employer] plan    PT Start Time  1616    PT Stop Time  1658    PT Time Calculation (min)  42 min    Activity Tolerance  Patient tolerated treatment well    Behavior During Therapy  WFL for tasks assessed/performed       Past Medical History:  Diagnosis Date  . Allergy   . Asthma   . IBS (irritable bowel syndrome)   . Obesity   . Seasonal allergic rhinitis   . Strep pharyngitis   . TMJ (temporomandibular joint syndrome)   . Vertigo     Past Surgical History:  Procedure Laterality Date  . BRAIN SURGERY     craniotomy/arnold chiari malformation; craniotomy & laminectomy  . BRAIN SURGERY     craniotomy/arnold chiari malformation    There were no vitals filed for this visit.  Subjective Assessment - 12/14/18 1757    Subjective  Pt. reports improvement with pain in right hand and feels like grip is getting stronger.    Currently in Pain?  No/denies         [facility] PT Assessment - 12/14/18 0001      Strength   Overall Strength Comments  R grip 70 lbs., L grip 67 lbs.                   [facility] Adult PT Treatment/Exercise - 12/14/18 0001      Manual Therapy   Manual therapy comments  skilled palpation of trigger points    Soft tissue mobilization  STM bilat. upper traps in sitting    Manual Traction  gentle cervical manual traction and suboccipital release    Neural Stretch  manual nerve stretch right ulnar and median nerve x 10 ea.      Neck Exercises: Stretches   Upper Trapezius  Stretch  Right;Left;3 reps;30 seconds    Levator Stretch  Right;Left;3 reps;30 seconds       Trigger Point Dry Needling - 12/14/18 0001    Consent Given?  Yes    Muscles Treated Head and Neck  Upper trapezius;Semispinalis capitus    Muscles Treated Wrist/Hand  Opponens pollicis    Dry Needling Comments  muscle needled bilat. with 40 mm 32 gauge needle for neck muscles and 32 gauge 30 mm needles for wrist/forearm and hand    Electrical Stimulation Performed with Dry Needling  Yes    E-stim with Dry Needling Details  TENS 100 pps to tolerance x 10 min, needles left in situ during manual traction and suboccipital release           PT Education - 12/14/18 1801    Education Details  POC, grip strength improvements/progress    Person(s) Educated  Patient    Methods  Explanation    Comprehension  Verbalized understanding          PT Long Term Goals - 12/14/18 1804      PT LONG TERM GOAL #1   Title    Pt will be independent with advanced HEP    Baseline  basic HEP at eval, will update prn    Time  6    Period  Weeks    Status  On-going      PT LONG TERM GOAL #2   Title  Improve FOTO outcome to 27% or less impairment    Baseline  35% limited at eval, not retested today    Time  6    Period  Weeks    Status  On-going      PT LONG TERM GOAL #3   Title  Increase left cervical rotation 5-10 deg to improve ability to turn head while driving    Baseline  65 deg    Time  6    Period  Weeks    Status  Achieved      PT LONG TERM GOAL #4   Title  R grip 40 lbs. or greater at 3rd rep of grip dynamonometer testing to improve grip endurance for work duties    Baseline  70 lb. grip, goal met    Time  6    Period  Weeks    Status  On-going      PT LONG TERM GOAL #5   Title  Perform gripping activities for cooking, opening jars, work duties with 50% or greater symptom reduction    Baseline  goal ongoing, still with limitations for this but improving from baseline status    Time  6     Period  Weeks    Status  On-going            Plan - 12/14/18 1802    Clinical Impression Statement  More improvement now with pain and significant improvement demonstrated today in bilat. grip strength compared with baseline status with associated functional gains for gripping and lifting activities.    Personal Factors and Comorbidities  Time since onset of injury/illness/exacerbation    Examination-Activity Limitations  Lift;Carry    Examination-Participation Restrictions  Meal Prep    Stability/Clinical Decision Making  Evolving/Moderate complexity    Rehab Potential  Good    PT Frequency  2x / week    PT Duration  6 weeks    PT Treatment/Interventions  ADLs/Self Care Home Management;Cryotherapy;Ultrasound;Moist Heat;Iontophoresis 67m/ml Dexamethasone;Electrical Stimulation;Manual techniques;Dry needling;Therapeutic exercise;Therapeutic activities;Patient/family education;Taping;Neuromuscular re-education    PT Next Visit Plan  continue estim use, dry needling, cervical/upper trap stretches and STM, nerve glides, postural strengthening    PT Home Exercise Plan  ulnar nerve floss, chin tucks, scapular retraction, upper trap stretch, wrist flexor stretches and median nerve glide    Consulted and Agree with Plan of Care  Patient       Patient will benefit from skilled therapeutic intervention in order to improve the following deficits and impairments:  Pain, Impaired sensation, Impaired UE functional use, Decreased strength, Postural dysfunction  Visit Diagnosis: 1. Pain in right arm   2. Pain in left arm        Problem List Patient Active Problem List   Diagnosis Date Noted  . Family history of fragile X syndrome 03/25/2016  . History of acute bronchitis with bronchospasm 04/26/2012  . MTHFR mutation ([place] 04/01/2012  . 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

## 2018-12-19 ENCOUNTER — Encounter: Payer: Self-pay | Admitting: Physical Therapy

## 2018-12-19 ENCOUNTER — Other Ambulatory Visit: Payer: Self-pay

## 2018-12-19 ENCOUNTER — Ambulatory Visit: Payer: No Typology Code available for payment source | Admitting: Physical Therapy

## 2018-12-19 DIAGNOSIS — M79601 Pain in right arm: Secondary | ICD-10-CM | POA: Diagnosis not present

## 2018-12-19 DIAGNOSIS — M79602 Pain in left arm: Secondary | ICD-10-CM

## 2018-12-19 NOTE — Therapy (Signed)
[place], [place], [address] Phone: [phone]   Fax:  [phone]  Physical Therapy Treatment  Patient Details  Name: Kristina Stewart MRN: [id number] Date of Birth: 08/17/1985 Referring Provider (PT): Sarina Ill, MD   Encounter Date: 12/19/2018  PT End of Session - 12/19/18 1620    Visit Number  7    Number of Visits  12    Date for PT Re-Evaluation  12/26/18    Authorization Type  [facility] [employer] plan    PT Start Time  6226    PT Stop Time  1615    PT Time Calculation (min)  44 min    Activity Tolerance  Patient tolerated treatment well    Behavior During Therapy  [facility] for tasks assessed/performed       Past Medical History:  Diagnosis Date  . Allergy   . Asthma   . IBS (irritable bowel syndrome)   . Obesity   . Seasonal allergic rhinitis   . Strep pharyngitis   . TMJ (temporomandibular joint syndrome)   . Vertigo     Past Surgical History:  Procedure Laterality Date  . BRAIN SURGERY     craniotomy/arnold chiari malformation; craniotomy & laminectomy  . BRAIN SURGERY     craniotomy/arnold chiari malformation    There were no vitals filed for this visit.  Subjective Assessment - 12/19/18 1532    Subjective  Mild pain in right medial elbow and radial aspect of wrist. EMG/NCS scheduled for 01/05/19. Overall still feels improving.    Currently in Pain?  Yes    Pain Score  4     Pain Location  Elbow    Pain Orientation  Right;Medial    Pain Descriptors / Indicators  Aching    Pain Type  Chronic pain    Pain Radiating Towards  medial aspect of elbow and radial aspect of wrist    Pain Onset  More than a month ago    Pain Frequency  Constant    Aggravating Factors   right UE use, gripping activities    Pain Relieving Factors  rest    Effect of Pain on Daily Activities  limits ability for RUE use for fine motor skills, work duties, gripping activities                        [place] [facility] PT Treatment/Exercise - 12/19/18 0001      Manual Therapy   Manual therapy comments  skilled palpation of trigger points    Soft tissue mobilization  STM bilat. upper trapezius region    Manual Traction  gentle manual cervical traction and suboccipital release      Neck Exercises: Stretches   Upper Trapezius Stretch  Right;Left;3 reps;30 seconds   supine manual stretches   Levator Stretch  Right;Left;3 reps;30 seconds   supine manual stretches      Trigger Point Dry Needling - 12/19/18 0001    Consent Given?  Yes    Muscles Treated Head and Neck  Upper trapezius;Semispinalis capitus    Muscles Treated Wrist/Hand  Flexor carpi ulnaris;Opponens pollicis    Dry Needling Comments  muscles needles bilat., wrist/hand muscle needles left in situ x 10 min, 40 mm 32 gauge needles for cervical region, 30 mm 32 gauge needles for wrist/hand    Electrical Stimulation Performed with Dry Needling  Yes    E-stim with Dry Needling Details  TENS 100 pps to right flexor  carpi ulnaris and opponens polliicus           PT Education - 12/19/18 1620    Education Details  POC    Person(s) Educated  Patient    Methods  Explanation    Comprehension  Verbalized understanding          PT Long Term Goals - 12/14/18 1804      PT LONG TERM GOAL #1   Title  Pt will be independent with advanced HEP    Baseline  basic HEP at eval, will update prn    Time  6    Period  Weeks    Status  On-going      PT LONG TERM GOAL #2   Title  Improve FOTO outcome to 27% or less impairment    Baseline  35% limited at eval, not retested today    Time  6    Period  Weeks    Status  On-going      PT LONG TERM GOAL #3   Title  Increase left cervical rotation 5-10 deg to improve ability to turn head while driving    Baseline  65 deg    Time  6    Period  Weeks    Status  Achieved      PT LONG TERM GOAL #4   Title  R grip 40 lbs. or greater at 3rd rep of grip  dynamonometer testing to improve grip endurance for work duties    Baseline  70 lb. grip, goal met    Time  6    Period  Weeks    Status  On-going      PT LONG TERM GOAL #5   Title  Perform gripping activities for cooking, opening jars, work duties with 50% or greater symptom reduction    Baseline  goal ongoing, still with limitations for this but improving from baseline status    Time  6    Period  Weeks    Status  On-going            Plan - 12/19/18 1620    Clinical Impression Statement  Pt. is making functional gains with grip strength/tolerance gripping activities but still with associated functional limitations for these for prolonged use such as gripping vacuum for use in multiple rooms (fatigues/limited due to pain after 1 room). Given pt. noting some improvement plan continue POC pending further assessment iwth EMG/NCS.    Stability/Clinical Decision Making  Evolving/Moderate complexity    Clinical Decision Making  Moderate    Rehab Potential  Good    PT Frequency  2x / week    PT Duration  6 weeks    PT Treatment/Interventions  ADLs/Self Care Home Management;Cryotherapy;Ultrasound;Moist Heat;Iontophoresis 23m/ml Dexamethasone;Electrical Stimulation;Manual techniques;Dry needling;Therapeutic exercise;Therapeutic activities;Patient/family education;Taping;Neuromuscular re-education    PT Next Visit Plan  continue estim use, dry needling, cervical/upper trap stretches and STM, nerve glides, postural strengthening    PT Home Exercise Plan  ulnar nerve floss, chin tucks, scapular retraction, upper trap stretch, wrist flexor stretches and median nerve glide    Consulted and Agree with Plan of Care  Patient       Patient will benefit from skilled therapeutic intervention in order to improve the following deficits and impairments:  Pain, Impaired sensation, Impaired UE functional use, Decreased strength, Postural dysfunction  Visit Diagnosis: 1. Pain in right arm   2. Pain in  left arm        Problem List Patient Active Problem  List   Diagnosis Date Noted  . Family history of fragile X syndrome 03/25/2016  . History of acute bronchitis with bronchospasm 04/26/2012  . MTHFR mutation ([place]) 04/01/2012  . 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 [place], [place], [address] Phone: [phone]   Fax:  [phone]  Name: Kristina Stewart MRN: [id number] Date of Birth: February 28, 1986

## 2018-12-20 NOTE — Telephone Encounter (Signed)
Scheduled office visit 

## 2018-12-21 ENCOUNTER — Encounter: Payer: Self-pay | Admitting: Physical Therapy

## 2018-12-21 ENCOUNTER — Ambulatory Visit: Payer: No Typology Code available for payment source | Admitting: Physical Therapy

## 2018-12-21 ENCOUNTER — Other Ambulatory Visit: Payer: Self-pay

## 2018-12-21 DIAGNOSIS — M79601 Pain in right arm: Secondary | ICD-10-CM

## 2018-12-21 DIAGNOSIS — M79602 Pain in left arm: Secondary | ICD-10-CM

## 2018-12-21 NOTE — Therapy (Signed)
[place], [place], [address] Phone: [phone]   Fax:  [phone]  Physical Therapy Treatment  Patient Details  Name: Kristina Stewart MRN: [id number] Date of Birth: 10/22/1985 Referring Provider (PT): Sarina Ill, MD   Encounter Date: 12/21/2018  PT End of Session - 12/21/18 1703    Visit Number  8    Number of Visits  12    Date for PT Re-Evaluation  12/26/18    Authorization Type  [facility] [employer] plan    PT Start Time  1594    PT Stop Time  1700    PT Time Calculation (min)  43 min    Activity Tolerance  Patient tolerated treatment well    Behavior During Therapy  [facility] for tasks assessed/performed       Past Medical History:  Diagnosis Date  . Allergy   . Asthma   . IBS (irritable bowel syndrome)   . Obesity   . Seasonal allergic rhinitis   . Strep pharyngitis   . TMJ (temporomandibular joint syndrome)   . Vertigo     Past Surgical History:  Procedure Laterality Date  . BRAIN SURGERY     craniotomy/arnold chiari malformation; craniotomy & laminectomy  . BRAIN SURGERY     craniotomy/arnold chiari malformation    There were no vitals filed for this visit.  Subjective Assessment - 12/21/18 1620    Subjective  Pt. reports there have been improvements with strength/less weakness but continues to have difficulty with gripping with activities such as vacuuming with numbness and tingling experienced. Overall she rates improvement at about 70% from baseline.    Pertinent History  TMJ    Limitations  House hold activities;Lifting;Other (comment)    Currently in Pain?  Yes    Pain Score  7     Pain Location  Wrist    Pain Orientation  Right    Pain Descriptors / Indicators  Aching    Pain Type  Chronic pain    Pain Radiating Towards  radial aspect of distal wrist    Pain Onset  More than a month ago    Pain Frequency  Intermittent    Aggravating Factors   gripping activities, RUE use    Pain Relieving Factors  rest    Effect of Pain on Daily Activities  limited ability RUE use for chores, cleaning, work duties         [facility] PT Assessment - 12/21/18 0001      Observation/Other Assessments   Focus on Therapeutic Outcomes (FOTO)   39% Limited                   [facility] Adult PT Treatment/Exercise - 12/21/18 0001      Manual Therapy   Manual therapy comments  skilled palpation of trigger points    Soft tissue mobilization  STM bilat. upper trapezius region    Manual Traction  gentle manual cervical traction and suboccipital release       Trigger Point Dry Needling - 12/21/18 0001    Consent Given?  Yes    Muscles Treated Head and Neck  Upper trapezius;Semispinalis capitus    Muscles Treated Wrist/Hand  Flexor carpi ulnaris;Opponens pollicis    Dry Needling Comments  muscles needles bilat., wrist/hand muscle needles left in situ x 10 min, 40 mm 32 gauge needles for cervical region, 30 mm 32 gauge needles for wrist/hand    Electrical Stimulation Performed with Dry Needling  Yes    E-stim with Dry Needling Details  TENS 100 pps to right flexor carpi ulnaris and opponens polliicus           PT Education - 12/21/18 1703    Education Details  POC    Person(s) Educated  Patient    Methods  Explanation    Comprehension  Verbalized understanding          PT Long Term Goals - 12/21/18 1708      PT LONG TERM GOAL #1   Title  Pt will be independent with advanced HEP    Baseline  met for initial HEP    Time  6    Period  Weeks    Status  Achieved      PT LONG TERM GOAL #2   Title  Improve FOTO outcome to 27% or less impairment    Baseline  39% limited    Time  6    Period  Weeks    Status  Not Met      PT LONG TERM GOAL #3   Title  Increase left cervical rotation 5-10 deg to improve ability to turn head while driving    Baseline  65 deg    Time  6    Period  Weeks    Status  Achieved      PT LONG TERM GOAL #4   Title  R grip 40 lbs. or  greater at 3rd rep of grip dynamonometer testing to improve grip endurance for work duties    Baseline  70 lb. grip, goal met    Time  6    Period  Weeks    Status  Achieved      PT LONG TERM GOAL #5   Title  Perform gripping activities for cooking, opening jars, work duties with 50% or greater symptom reduction    Baseline  goal met but continues with associated functional limitations with gripping for chores, cleaning and work duties    Time  6    Period  Weeks    Status  Achieved            Plan - 12/21/18 1704    Clinical Impression Statement  Pt. has made improvements with both grip strength and decreased cervical tightness/ROM gains from baseline but continues with elbow and hand pain and associated functional limitations for gripping activities. Continues with symptoms consistent with ulnar neuropathy, possible carpal tunnel given symptoms on palmar aspect of wrist and thenar eminence, also (+) Finkelstein's test with distal radial pain could be consistent with DeQuervain's so overall suspect multifactorial etiology. Pt. is scheduled for NCS 01/05/19 so for now plan is to have her continue with HEP and await testing with further plan of care pending results and MD recommendations.    Personal Factors and Comorbidities  Time since onset of injury/illness/exacerbation    Examination-Activity Limitations  Lift;Carry    Examination-Participation Restrictions  Meal Prep;Cleaning    Stability/Clinical Decision Making  Evolving/Moderate complexity    Clinical Decision Making  Moderate    Rehab Potential  Good    PT Frequency  2x / week    PT Duration  6 weeks    PT Treatment/Interventions  ADLs/Self Care Home Management;Cryotherapy;Ultrasound;Moist Heat;Iontophoresis 54m/ml Dexamethasone;Electrical Stimulation;Manual techniques;Dry needling;Therapeutic exercise;Therapeutic activities;Patient/family education;Taping;Neuromuscular re-education    PT Next Visit Plan  await NCS results     PT Home Exercise Plan  ulnar nerve floss, chin tucks, scapular retraction, upper trap stretch, wrist flexor stretches  and median nerve glide       Patient will benefit from skilled therapeutic intervention in order to improve the following deficits and impairments:  Pain, Impaired sensation, Impaired UE functional use, Decreased strength, Postural dysfunction  Visit Diagnosis: 1. Pain in right arm   2. Pain in left arm        Problem List Patient Active Problem List   Diagnosis Date Noted  . Family history of fragile X syndrome 03/25/2016  . History of acute bronchitis with bronchospasm 04/26/2012  . MTHFR mutation ([place]) 04/01/2012  . 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

## 2018-12-22 ENCOUNTER — Encounter: Payer: Self-pay | Admitting: *Deleted

## 2019-01-05 ENCOUNTER — Ambulatory Visit (INDEPENDENT_AMBULATORY_CARE_PROVIDER_SITE_OTHER): Payer: No Typology Code available for payment source | Admitting: Internal Medicine

## 2019-01-05 ENCOUNTER — Encounter: Payer: Self-pay | Admitting: Internal Medicine

## 2019-01-05 ENCOUNTER — Other Ambulatory Visit: Payer: Self-pay

## 2019-01-05 ENCOUNTER — Ambulatory Visit (INDEPENDENT_AMBULATORY_CARE_PROVIDER_SITE_OTHER): Payer: No Typology Code available for payment source | Admitting: Neurology

## 2019-01-05 VITALS — BP 120/88 | HR 80 | Temp 98.4°F | Ht 64.75 in | Wt 231.0 lb

## 2019-01-05 DIAGNOSIS — R197 Diarrhea, unspecified: Secondary | ICD-10-CM | POA: Diagnosis not present

## 2019-01-05 DIAGNOSIS — R29898 Other symptoms and signs involving the musculoskeletal system: Secondary | ICD-10-CM | POA: Diagnosis not present

## 2019-01-05 DIAGNOSIS — Z0289 Encounter for other administrative examinations: Secondary | ICD-10-CM

## 2019-01-05 DIAGNOSIS — R531 Weakness: Secondary | ICD-10-CM

## 2019-01-05 DIAGNOSIS — R200 Anesthesia of skin: Secondary | ICD-10-CM | POA: Diagnosis not present

## 2019-01-05 DIAGNOSIS — M6281 Muscle weakness (generalized): Secondary | ICD-10-CM | POA: Diagnosis not present

## 2019-01-05 DIAGNOSIS — G5623 Lesion of ulnar nerve, bilateral upper limbs: Secondary | ICD-10-CM

## 2019-01-05 DIAGNOSIS — M779 Enthesopathy, unspecified: Secondary | ICD-10-CM

## 2019-01-05 MED ORDER — ALPRAZOLAM 0.25 MG PO TABS: ORAL_TABLET | ORAL | 2 refills | Status: DC

## 2019-01-05 MED FILL — ALPRAZolam 0.25 MG TABS: 0.25 | 30 days supply | Qty: 60 | Fill #0

## 2019-01-05 NOTE — Progress Notes (Signed)
   Subjective:    Patient ID: Kristina Stewart, female    DOB: 02/08/1986, 33 y.o.   MRN: [id number]  HPI 33 year old Female with episodic diarrhea for some 10 years.  Shortly after eating, she gets gurgling noises in intestines.   Caffeine, Vegetables esp green vegetables, nuts, olives do not digest well and can trigger diarrhea,  Review of Systems Hx bacterial colitis 10 years ago in [place] treated with antiibiotics.  This is a frequent occurrence especially with stress at work. She is miserable from this. It is inconvenient and unpredictable.  No recent travel history     Objective:   Physical Exam  Abdomen is soft nondistended without hepatosplenomegaly or significant tenderness.  She is afebrile.      Assessment & Plan:  Irritable bowel syndrome-D  Plan: Discussion regarding management of irritable bowel symptoms.  Avoidance of foods that trigger diarrhea.  Trial of Xanax 1/2-hour before breakfast and dinner #60 with 2 refills.  If not improving, she will be referred to gastroenterologist.  25 minutes spent with patient and with medical decision making explaining treatments and dietary adjustments.  Most common bowel complaint in [place].  She would like to be checked for celiac disease.  We will also have her submit stool for  fecal lactoferrin and O&P.

## 2019-01-05 NOTE — Progress Notes (Signed)
History:  This is a lovely 33 year old female with sensory changes and weakness in the distal upper extremities. Symptoms are progressive. She initially reports more sensory changes in the ulnar distribution but today reports progressive weakness and decreased fine motor function. She  couldn't turn the door knob because of weakness and was stuck in a room, had difficulty with velcro of diaper with pinching of fingers with thumb opposition. Physical therapy has helped. She still has some problems with pinching that requires some strength. She fatigues must faster. No significant sensory complaints today more weakness and cramping. Examination did show distal muscle weakness of the hands.   -I reviewed EMG/NCS study with patient. EMG/NCS unrevealing. - Given concerning symptoms and physical findings, need to check for central process. MRI brain MRI cervical spine both w/wo contrast evaluate for MS or other intracranial or spinal cord disorder.  - Her mother has multiple mononeuropathies so we sent out an 80+ genetic polyneuropathy panel that included HNPP (Hereditary Neuropathy with Liability to Pressure Palsies). The genetic panel was normal.  - will check labs today including CK - May consider another genetic panel for genetic muscle disorders given unusual distal weakness. No atrophy or wasting noted.   Orders Placed This Encounter  Procedures  . MR BRAIN W WO CONTRAST  . MR CERVICAL SPINE W WO CONTRAST  . CK  . B12 and Folate Panel  . Methylmalonic acid, serum  . B. burgdorfi Antibody   .- muscular dystrophy genetic panel ordered as well  A total of 25 minutes was spent face-to-face with this patient. Over half this time was spent on counseling patient on the  1. Weakness   2. Upper extremity weakness   3. Hand numbness   4. Distal muscle weakness   5. Inflammation around joint   6. Ulnar neuropathy of both upper extremities    diagnosis and different diagnostic and therapeutic options,  counseling and coordination of care, risks ans benefits of management, compliance, or risk factor reduction and education. This does  Not include time spent on emg/ncs.

## 2019-01-08 NOTE — Progress Notes (Signed)
See procedure note.

## 2019-01-08 NOTE — Progress Notes (Signed)
Full Name: Kristina Stewart Gender: Female MRN #: [id number] Date of Birth: [birth year]    Visit Date: 01/05/2019 09:32 Age: 33 Years 87 Months Old Examining Physician: Sarina Ill, MD  Referring Physician: Elby Showers, MD  History: This is a patient with unexplained weakness in the upper extremities as well as decreased fine motor in the hands.   Summary: EMG/NCS was performed on the upper extremities.    Conclusion: This is a normal study. No electrophysiologic evidence for mononeuropathy, polyneuropathy or radiculopathy. Nothing to explain her symptoms found.   Sarina Ill, M.D. Cc: [facility]. [facility] [place], [place] [address] Tel: [phone] Fax: [phone]        [facility]    Nerve / Sites Muscle Latency Ref. Amplitude Ref. Rel Amp Segments Distance Velocity Ref. Area    ms ms mV mV %  cm m/s m/s mVms  R Median - APB     Wrist APB 2.9 ?4.4 14.4 ?4.0 100 Wrist - APB 7   44.6     Upper arm APB 6.4  11.8  81.8 Upper arm - Wrist 20 57 ?49 34.8  L Median - APB     Wrist APB 2.9 ?4.4 11.8 ?4.0 100 Wrist - APB 7   41.2     Upper arm APB 6.3  11.5  97 Upper arm - Wrist 18 54 ?49 40.6  R Ulnar - ADM     Wrist ADM 2.0 ?3.3 9.1 ?6.0 100 Wrist - ADM 7   30.4     B.Elbow ADM 4.8  9.0  99.4 B.Elbow - Wrist 17 60 ?49 29.7     A.Elbow ADM 6.6  8.8  97.8 A.Elbow - B.Elbow 10 58 ?49 31.0         A.Elbow - Wrist      L Ulnar - ADM     Wrist ADM 2.2 ?3.3 10.0 ?6.0 100 Wrist - ADM 7   32.4     B.Elbow ADM 4.9  9.6  96 B.Elbow - Wrist 17 62 ?49 30.3     A.Elbow ADM 6.6  9.3  96.5 A.Elbow - B.Elbow 10 62 ?49 29.9         A.Elbow - Wrist      R Ulnar - FDI     Wrist FDI 3.2 ?4.5 8.8 ?7.0 100 Wrist - FDI 8   21.8     B.Elbow FDI 5.9  8.5  96.9 B.Elbow - Wrist 17 62 ?49 20.8     A.Elbow FDI 7.7  8.4  98.9 A.Elbow - B.Elbow 10 58 ?49 21.3         A.Elbow - Wrist      L Ulnar - FDI     Wrist FDI 3.1 ?4.5 7.0 ?7.0 100 Wrist - FDI 8   19.0   B.Elbow FDI 5.9  6.1  88 B.Elbow - Wrist 17 62 ?49 15.8     A.Elbow FDI 7.6  6.1  100 A.Elbow - B.Elbow 10 60 ?49 16.1         A.Elbow - Wrist                         SNC    Nerve / Sites Rec. Site Peak Lat Ref.  Amp Ref. Segments Distance Peak Diff Ref.    ms ms V V  cm ms ms  R Median, Ulnar - Transcarpal comparison  Median Palm Wrist 1.9 ?2.2 91 ?35 Median Palm - Wrist 8       Ulnar Palm Wrist 1.9 ?2.2 23 ?12 Ulnar Palm - Wrist 8          Median Palm - Ulnar Palm  0.0 ?0.4  L Median, Ulnar - Transcarpal comparison     Median Palm Wrist 1.7 ?2.2 81 ?35 Median Palm - Wrist 8       Ulnar Palm Wrist 1.9 ?2.2 18 ?12 Ulnar Palm - Wrist 8          Median Palm - Ulnar Palm  -0.2 ?0.4  R Median - Orthodromic (Dig II, Mid palm)     Dig II Wrist 2.7 ?3.4 29 ?10 Dig II - Wrist 13    L Median - Orthodromic (Dig II, Mid palm)     Dig II Wrist 2.6 ?3.4 27 ?10 Dig II - Wrist 13    R Ulnar - Orthodromic, (Dig V, Mid palm)     Dig V Wrist 2.3 ?3.1 20 ?5 Dig V - Wrist 11    L Ulnar - Orthodromic, (Dig V, Mid palm)     Dig V Wrist 2.4 ?3.1 14 ?5 Dig V - Wrist 7911                   F  Wave    Nerve F Lat Ref.   ms ms  R Ulnar - ADM 25.2 ?32.0  L Ulnar - ADM 25.0 ?32.0         EMG full       EMG Summary Table    Spontaneous MUAP Recruitment  Muscle IA Fib PSW Fasc Other Amp Dur. Poly Pattern  L. Cervical paraspinals (low) Normal None None None _______ Normal Normal Normal Normal  R. Cervical paraspinals (low) Normal None None None _______ Normal Normal Normal Normal  L. Deltoid Normal None None None _______ Normal Normal Normal Normal  R. Deltoid Normal None None None _______ Normal Normal Normal Normal  L. Triceps brachii Normal None None None _______ Normal Normal Normal Normal  R. Triceps brachii Normal None None None _______ Normal Normal Normal Normal  L. Flexor digitorum profundus (Ulnar) Normal None None None _______ Normal Normal Normal Normal  R. Flexor digitorum profundus  (Ulnar) Normal None None None _______ Normal Normal Normal Normal  L. Pronator teres Normal None None None _______ Normal Normal Normal Normal  R. Pronator teres Normal None None None _______ Normal Normal Normal Normal  L. First dorsal interosseous Normal None None None _______ Normal Normal Normal Normal  R. First dorsal interosseous Normal None None None _______ Normal Normal Normal Normal  L. Opponens pollicis Normal None None None _______ Normal Normal Normal Normal  R. Opponens pollicis Normal None None None _______ Normal Normal Normal Normal

## 2019-01-08 NOTE — Procedures (Signed)
Full Name: Kristina Stewart Gender: Female MRN #: [id number] Date of Birth: [birth year]    Visit Date: 01/05/2019 09:32 Age: 33 Years 87 Months Old Examining Physician: Sarina Ill, MD  Referring Physician: Elby Showers, MD  History: This is a patient with unexplained weakness in the upper extremities as well as decreased fine motor in the hands.   Summary: EMG/NCS was performed on the upper extremities.    Conclusion: This is a normal study. No electrophysiologic evidence for mononeuropathy, polyneuropathy or radiculopathy. Nothing to explain her symptoms found.   Sarina Ill, M.D. Cc: [facility] [place], [place] [address] Tel: [phone] Fax: [phone]        [facility]    Nerve / Sites Muscle Latency Ref. Amplitude Ref. Rel Amp Segments Distance Velocity Ref. Area    ms ms mV mV %  cm m/s m/s mVms  R Median - APB     Wrist APB 2.9 ?4.4 14.4 ?4.0 100 Wrist - APB 7   44.6     Upper arm APB 6.4  11.8  81.8 Upper arm - Wrist 20 57 ?49 34.8  L Median - APB     Wrist APB 2.9 ?4.4 11.8 ?4.0 100 Wrist - APB 7   41.2     Upper arm APB 6.3  11.5  97 Upper arm - Wrist 18 54 ?49 40.6  R Ulnar - ADM     Wrist ADM 2.0 ?3.3 9.1 ?6.0 100 Wrist - ADM 7   30.4     B.Elbow ADM 4.8  9.0  99.4 B.Elbow - Wrist 17 60 ?49 29.7     A.Elbow ADM 6.6  8.8  97.8 A.Elbow - B.Elbow 10 58 ?49 31.0         A.Elbow - Wrist      L Ulnar - ADM     Wrist ADM 2.2 ?3.3 10.0 ?6.0 100 Wrist - ADM 7   32.4     B.Elbow ADM 4.9  9.6  96 B.Elbow - Wrist 17 62 ?49 30.3     A.Elbow ADM 6.6  9.3  96.5 A.Elbow - B.Elbow 10 62 ?49 29.9         A.Elbow - Wrist      R Ulnar - FDI     Wrist FDI 3.2 ?4.5 8.8 ?7.0 100 Wrist - FDI 8   21.8     B.Elbow FDI 5.9  8.5  96.9 B.Elbow - Wrist 17 62 ?49 20.8     A.Elbow FDI 7.7  8.4  98.9 A.Elbow - B.Elbow 10 58 ?49 21.3         A.Elbow - Wrist      L Ulnar - FDI     Wrist FDI 3.1 ?4.5 7.0 ?7.0 100 Wrist - FDI 8   19.0   B.Elbow FDI 5.9  6.1  88 B.Elbow - Wrist 17 62 ?49 15.8     A.Elbow FDI 7.6  6.1  100 A.Elbow - B.Elbow 10 60 ?49 16.1         A.Elbow - Wrist                         [facility]    Nerve / Sites Rec. Site Peak Lat Ref.  Amp Ref. Segments Distance Peak Diff Ref.    ms ms V V  cm ms ms  R Median, Ulnar - Transcarpal comparison  Median Palm Wrist 1.9 ?2.2 91 ?35 Median Palm - Wrist 8       Ulnar Palm Wrist 1.9 ?2.2 23 ?12 Ulnar Palm - Wrist 8          Median Palm - Ulnar Palm  0.0 ?0.4  L Median, Ulnar - Transcarpal comparison     Median Palm Wrist 1.7 ?2.2 81 ?35 Median Palm - Wrist 8       Ulnar Palm Wrist 1.9 ?2.2 18 ?12 Ulnar Palm - Wrist 8          Median Palm - Ulnar Palm  -0.2 ?0.4  R Median - Orthodromic (Dig II, Mid palm)     Dig II Wrist 2.7 ?3.4 29 ?10 Dig II - Wrist 13    L Median - Orthodromic (Dig II, Mid palm)     Dig II Wrist 2.6 ?3.4 27 ?10 Dig II - Wrist 13    R Ulnar - Orthodromic, (Dig V, Mid palm)     Dig V Wrist 2.3 ?3.1 20 ?5 Dig V - Wrist 11    L Ulnar - Orthodromic, (Dig V, Mid palm)     Dig V Wrist 2.4 ?3.1 14 ?5 Dig V - Wrist 11                   F  Wave    Nerve F Lat Ref.   ms ms  R Ulnar - ADM 25.2 ?32.0  L Ulnar - ADM 25.0 ?32.0         EMG full       EMG Summary Table    Spontaneous MUAP Recruitment  Muscle IA Fib PSW Fasc Other Amp Dur. Poly Pattern  L. Cervical paraspinals (low) Normal None None None _______ Normal Normal Normal Normal  R. Cervical paraspinals (low) Normal None None None _______ Normal Normal Normal Normal  L. Deltoid Normal None None None _______ Normal Normal Normal Normal  R. Deltoid Normal None None None _______ Normal Normal Normal Normal  L. Triceps brachii Normal None None None _______ Normal Normal Normal Normal  R. Triceps brachii Normal None None None _______ Normal Normal Normal Normal  L. Flexor digitorum profundus (Ulnar) Normal None None None _______ Normal Normal Normal Normal  R. Flexor digitorum profundus  (Ulnar) Normal None None None _______ Normal Normal Normal Normal  L. Pronator teres Normal None None None _______ Normal Normal Normal Normal  R. Pronator teres Normal None None None _______ Normal Normal Normal Normal  L. First dorsal interosseous Normal None None None _______ Normal Normal Normal Normal  R. First dorsal interosseous Normal None None None _______ Normal Normal Normal Normal  L. Opponens pollicis Normal None None None _______ Normal Normal Normal Normal  R. Opponens pollicis Normal None None None _______ Normal Normal Normal Normal    

## 2019-01-09 ENCOUNTER — Telehealth: Payer: Self-pay | Admitting: Neurology

## 2019-01-09 LAB — TISSUE TRANSGLUTAMINASE ABS,IGG,IGA
(tTG) Ab, IgA: 1 U/mL
(tTG) Ab, IgG: 2 U/mL

## 2019-01-09 NOTE — Telephone Encounter (Signed)
LVM for pt to call back about scheduling mri   [facility] auth: [id number]

## 2019-01-11 NOTE — Telephone Encounter (Signed)
Pt returning call please call back °

## 2019-01-12 LAB — METHYLMALONIC ACID, SERUM: Methylmalonic Acid: 83 nmol/L (ref 0–378)

## 2019-01-12 LAB — B. BURGDORFI ANTIBODIES: Lyme IgG/IgM Ab: <0.91 {ISR} (ref 0.00–0.90)

## 2019-01-12 LAB — B12 AND FOLATE PANEL
Folate: 16.1 ng/mL (ref >3.0)
Vitamin B-12: 477 pg/mL (ref 232–1245)

## 2019-01-12 LAB — CK: Total CK: 100 U/L (ref 32–182)

## 2019-01-12 NOTE — Telephone Encounter (Signed)
I spoke to the patient she is scheduled for 01/24/19 at [facility].

## 2019-01-13 LAB — OVA AND PARASITE EXAMINATION
CONCENTRATE RESULT:: NONE SEEN
MICRO NUMBER:: 665015
SPECIMEN QUALITY:: ADEQUATE
TRICHROME RESULT:: NONE SEEN

## 2019-01-13 LAB — TIQ-NTM

## 2019-01-13 LAB — FECAL LACTOFERRIN, QUANT
Fecal Lactoferrin: NEGATIVE
MICRO NUMBER:: 665014
SPECIMEN QUALITY:: ADEQUATE

## 2019-01-22 NOTE — Patient Instructions (Signed)
Testing for celiac disease, fecal lactoferrin, O&P.  Trial of Xanax twice daily.  Call if not improving.

## 2019-01-24 ENCOUNTER — Ambulatory Visit: Payer: No Typology Code available for payment source

## 2019-01-24 ENCOUNTER — Other Ambulatory Visit: Payer: Self-pay

## 2019-01-24 DIAGNOSIS — R29898 Other symptoms and signs involving the musculoskeletal system: Secondary | ICD-10-CM | POA: Diagnosis not present

## 2019-01-24 DIAGNOSIS — R200 Anesthesia of skin: Secondary | ICD-10-CM | POA: Diagnosis not present

## 2019-01-24 DIAGNOSIS — M6281 Muscle weakness (generalized): Secondary | ICD-10-CM

## 2019-01-24 DIAGNOSIS — R531 Weakness: Secondary | ICD-10-CM | POA: Diagnosis not present

## 2019-01-24 MED ORDER — GADOBENATE DIMEGLUMINE 529 MG/ML IV SOLN
20.0000 mL | Freq: Once | INTRAVENOUS | Status: AC | PRN
  Administered 2019-01-24: 11:00:00 20 mL via INTRAVENOUS

## 2019-01-30 MED FILL — FLUTICASONE PROP 50 MCG SPR: 50 | 30 days supply | Qty: 16 | Fill #0

## 2019-01-30 MED FILL — ETONOGESTREL-ETHINYL ESTRAD: 0.12-0.015 | 84 days supply | Qty: 3 | Fill #0

## 2019-02-02 ENCOUNTER — Other Ambulatory Visit: Payer: Self-pay

## 2019-02-02 ENCOUNTER — Ambulatory Visit (INDEPENDENT_AMBULATORY_CARE_PROVIDER_SITE_OTHER): Payer: No Typology Code available for payment source | Admitting: Internal Medicine

## 2019-02-02 ENCOUNTER — Encounter: Payer: Self-pay | Admitting: Internal Medicine

## 2019-02-02 VITALS — BP 120/80 | HR 78 | Ht 64.75 in | Wt 233.0 lb

## 2019-02-02 DIAGNOSIS — F411 Generalized anxiety disorder: Secondary | ICD-10-CM

## 2019-02-02 DIAGNOSIS — J3089 Other allergic rhinitis: Secondary | ICD-10-CM | POA: Diagnosis not present

## 2019-02-02 DIAGNOSIS — F439 Reaction to severe stress, unspecified: Secondary | ICD-10-CM

## 2019-02-02 DIAGNOSIS — M542 Cervicalgia: Secondary | ICD-10-CM | POA: Diagnosis not present

## 2019-02-02 DIAGNOSIS — M79601 Pain in right arm: Secondary | ICD-10-CM | POA: Diagnosis not present

## 2019-02-02 DIAGNOSIS — Z8709 Personal history of other diseases of the respiratory system: Secondary | ICD-10-CM

## 2019-02-02 MED ORDER — MONTELUKAST SODIUM 10 MG PO TABS: 10.0000 mg | ORAL_TABLET | Freq: Every day | ORAL | 3 refills | Status: DC

## 2019-02-02 MED ORDER — CYCLOBENZAPRINE HCL 10 MG PO TABS: 10.0000 mg | ORAL_TABLET | Freq: Three times a day (TID) | ORAL | 0 refills | Status: DC | PRN

## 2019-02-02 MED ORDER — FLUTICASONE-SALMETEROL 250-50 MCG/DOSE IN AEPB: 1.0000 | INHALATION_SPRAY | Freq: Two times a day (BID) | RESPIRATORY_TRACT | 99 refills | Status: DC

## 2019-02-02 MED ORDER — ALBUTEROL SULFATE HFA 108 (90 BASE) MCG/ACT IN AERS: 2.0000 | INHALATION_SPRAY | Freq: Four times a day (QID) | RESPIRATORY_TRACT | 99 refills | Status: DC | PRN

## 2019-02-02 MED FILL — CYCLOBENZAPRINE 10 MG TAB: 10 | 30 days supply | Qty: 90 | Fill #0

## 2019-02-02 MED FILL — ALBUTEROL SULFATE HFA 108 (: 108 (90 BAS | 25 days supply | Qty: 18 | Fill #0

## 2019-02-02 MED FILL — ADVAIR 250/50 DISKUS: 250-50 | 30 days supply | Qty: 60 | Fill #0

## 2019-02-02 MED FILL — MONTELUKAST SOD 10 MG TAB: 10 | 90 days supply | Qty: 90 | Fill #0

## 2019-02-20 NOTE — Therapy (Signed)
[place], [place], [address] Phone: [phone]   Fax:  [phone]  Physical Therapy Treatment/Discharge  Patient Details  Name: Kristina Stewart MRN: [id number] Date of Birth: August 04, 1985 Referring Provider (PT): Sarina Ill, MD   Encounter Date: 12/21/2018    Past Medical History:  Diagnosis Date  . Allergy   . Asthma   . IBS (irritable bowel syndrome)   . Obesity   . Seasonal allergic rhinitis   . Strep pharyngitis   . TMJ (temporomandibular joint syndrome)   . Vertigo     Past Surgical History:  Procedure Laterality Date  . BRAIN SURGERY     craniotomy/arnold chiari malformation; craniotomy & laminectomy  . BRAIN SURGERY     craniotomy/arnold chiari malformation    There were no vitals filed for this visit.                                 PT Long Term Goals - 12/21/18 1708      PT LONG TERM GOAL #1   Title  Pt will be independent with advanced HEP    Baseline  met for initial HEP    Time  6    Period  Weeks    Status  Achieved      PT LONG TERM GOAL #2   Title  Improve FOTO outcome to 27% or less impairment    Baseline  39% limited    Time  6    Period  Weeks    Status  Not Met      PT LONG TERM GOAL #3   Title  Increase left cervical rotation 5-10 deg to improve ability to turn head while driving    Baseline  65 deg    Time  6    Period  Weeks    Status  Achieved      PT LONG TERM GOAL #4   Title  R grip 40 lbs. or greater at 3rd rep of grip dynamonometer testing to improve grip endurance for work duties    Baseline  70 lb. grip, goal met    Time  6    Period  Weeks    Status  Achieved      PT LONG TERM GOAL #5   Title  Perform gripping activities for cooking, opening jars, work duties with 50% or greater symptom reduction    Baseline  goal met but continues with associated functional limitations with gripping for chores, cleaning  and work duties    Time  6    Period  Weeks    Status  Achieved              Patient will benefit from skilled therapeutic intervention in order to improve the following deficits and impairments:  Pain, Impaired sensation, Impaired UE functional use, Decreased strength, Postural dysfunction  Visit Diagnosis: Pain in right arm  Pain in left arm     Problem List Patient Active Problem List   Diagnosis Date Noted  . Family history of fragile X syndrome 03/25/2016  . History of acute bronchitis with bronchospasm 04/26/2012  . MTHFR mutation ([place]) 04/01/2012  . 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   Remaining deficits: NA   Education / Equipment: NA Plan: Patient agrees to discharge.  Patient goals were partially met. Patient is being discharged due to not returning since the last visit.  ?????          Beaulah Dinning, PT, DPT 02/20/19 10:15 AM    [facility] [address] [place], [place], [address] Phone: [phone]   Fax:  [phone]  Name: Kristina Stewart MRN: [id number] Date of Birth: May 04, 1986

## 2019-03-16 MED FILL — ALPRAZolam 0.25 MG TABS: 0.25 | 30 days supply | Qty: 60 | Fill #1

## 2019-03-19 NOTE — Progress Notes (Signed)
   Subjective:    Patient ID: Kristina Stewart, female    DOB: 1986/06/06, 33 y.o.   MRN: [id number]  HPI 33 year old Female in today to discuss several issues including right arm pain, neck pain and asthma management. Albuterol inhaler refilled. Given Rx for Singulair. Takes Xyzal and uses Flonase nasal spray.Has also used Optivar in eyes in the past if needed.  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     Review of Systems see above     Objective:   Physical Exam  15 minute discussion re musculoskeletal pain, allergic rhinitis symptoms and asthma control. Recommend Advair as maintenance and albuterol more for rescue.  For musculoskeletal pain which is related to palpable trapezius muscle spasm have prescribed Flexeril up to tid but mainly in the evening as it can cause drowsiness.      Assessment & Plan:  Situational stress with healthcare  job in pandemic  Musculoskeletal pain- treat with prn  Flexeril, heat or ice prn-related to physical activity with job  Asthma and allergic rhinitis. Continue Xyzal. Have prescribed Advair for maintenance inhaler one spray q 12 hours.Refill Albuterol inhaler to use if wheezing.  Continue Xyzal and Flonase.

## 2019-03-19 NOTE — Patient Instructions (Addendum)
Albuterol inhaler 2 sprays po 4 times daily as needed for wheezing.  Advair 250/50 1 spray p.o. every 12 hours maintenance inhaler.  Flexeril 10 mg 1/2 to 1 tablet up to 3 times daily as needed for musculoskeletal pain.  Apply heat or ice to neck and arm as needed.

## 2019-03-22 ENCOUNTER — Encounter: Payer: Self-pay | Admitting: Internal Medicine

## 2019-03-22 ENCOUNTER — Telehealth (INDEPENDENT_AMBULATORY_CARE_PROVIDER_SITE_OTHER): Payer: No Typology Code available for payment source | Admitting: Internal Medicine

## 2019-03-22 DIAGNOSIS — J22 Unspecified acute lower respiratory infection: Secondary | ICD-10-CM | POA: Diagnosis not present

## 2019-03-22 DIAGNOSIS — Z8709 Personal history of other diseases of the respiratory system: Secondary | ICD-10-CM

## 2019-03-22 MED ORDER — IPRATROPIUM-ALBUTEROL 0.5-2.5 (3) MG/3ML IN SOLN: 3.0000 mL | Freq: Four times a day (QID) | RESPIRATORY_TRACT | 0 refills | Status: DC | PRN

## 2019-03-22 MED ORDER — AZITHROMYCIN 250 MG PO TABS: ORAL_TABLET | ORAL | 0 refills | Status: DC

## 2019-03-22 MED ORDER — HYDROCOD POLST-CPM POLST ER 10-8 MG/5ML PO SUER: 5.0000 mL | Freq: Two times a day (BID) | ORAL | 0 refills | Status: DC | PRN

## 2019-03-22 MED FILL — IPRAT-ALBUT 0.5-3(2.5) MG/3: 0.5-2.5 (3) | 30 days supply | Qty: 360 | Fill #0

## 2019-03-22 MED FILL — HYDROCODONE-CHLORPHEN ER SU: 10-8 | 14 days supply | Qty: 140 | Fill #0

## 2019-03-22 MED FILL — AZITHROMYCIN 250 MG TABLET: 250 | 5 days supply | Qty: 6 | Fill #0

## 2019-03-22 NOTE — Telephone Encounter (Signed)
Has developed productive cough and low grade fever. Out of work until [employer] test is back. Hx of asthma with some bronchospasm.  Spoke with patient by phone.  Says she has been tachycardic at times.  May benefit from using her inhaler although she is tachycardic.  May have some anxiety  component as well with COVID testing pending.  She is a Dietitian.  Call in Tussionex 1 teaspoon p.o. every 12 hours as needed cough.  Call in Zithromax Z-PAK 2 tabs by mouth day 1 followed by 1 tab p.o. days 2 through 5 with no refill.  Called in [employer] solution.  Suggested she use albuterol inhaler and Advair inhaler regularly.

## 2019-03-24 ENCOUNTER — Ambulatory Visit: Payer: No Typology Code available for payment source | Admitting: Internal Medicine

## 2019-03-24 NOTE — Telephone Encounter (Signed)
Pt called and said her test for covid came back negative and that she is feeling better, she wasn't sure if you wanted to see her or just wait until her CPE

## 2019-04-10 ENCOUNTER — Encounter: Payer: Self-pay | Admitting: Internal Medicine

## 2019-04-10 ENCOUNTER — Telehealth: Payer: Self-pay | Admitting: Internal Medicine

## 2019-04-10 ENCOUNTER — Ambulatory Visit (INDEPENDENT_AMBULATORY_CARE_PROVIDER_SITE_OTHER): Payer: No Typology Code available for payment source | Admitting: Internal Medicine

## 2019-04-10 VITALS — BP 130/82 | Temp 98.4°F | Ht 64.75 in | Wt 233.0 lb

## 2019-04-10 DIAGNOSIS — J22 Unspecified acute lower respiratory infection: Secondary | ICD-10-CM | POA: Diagnosis not present

## 2019-04-10 MED ORDER — HYDROCOD POLST-CPM POLST ER 10-8 MG/5ML PO SUER: ORAL | 0 refills | Status: DC

## 2019-04-10 MED ORDER — HYDROCOD POLST-CPM POLST ER 10-8 MG/5ML PO SUER: 5.0000 mL | Freq: Two times a day (BID) | ORAL | 0 refills | Status: DC

## 2019-04-10 MED ORDER — PREDNISONE 10 MG PO TABS: ORAL_TABLET | ORAL | 0 refills | Status: DC

## 2019-04-10 MED ORDER — LEVOFLOXACIN 500 MG PO TABS: 500.0000 mg | ORAL_TABLET | Freq: Every day | ORAL | 0 refills | Status: DC

## 2019-04-10 MED FILL — HYDROCODONE-CHLORPHEN ER SU: 10-8 | 14 days supply | Qty: 140 | Fill #0

## 2019-04-10 MED FILL — predniSONE 10 MG TABS: 10 | 6 days supply | Qty: 21 | Fill #0

## 2019-04-10 MED FILL — levoFLOXacin 500 MG TABS: 500 | 10 days supply | Qty: 10 | Fill #0

## 2019-04-10 NOTE — Telephone Encounter (Signed)
Emailed work note to [email or web address], plus mailed original to home address.

## 2019-04-10 NOTE — Progress Notes (Signed)
   Subjective:    Patient ID: Kristina Stewart, female    DOB: Sep 17, 1985, 33 y.o.   MRN: [id number]  HPI 33 year old Female spoken with via phone on Sept 23 with complaint of cough. Treated with Z pak, inhalers, prn nebulizer treatment and Tussionex. Had productive cough and low grade fever. Covid test was negative.  She sent message through My Chart complaining of persistent cough today and virtual visit was advised.  She is identified using 2 identifiers as Kristina Stewart, a patient in this practice.  She is agreeable to visit in this format today due to COVID-19 pandemic.  She is seen at work.  She is in no acute distress.  No audible wheezing or shortness of breath noted.   Still has productive cough at times. Still having urge to cough and sputum is discolored. She was offered CXR but declined. Using inhalers. Nebulizer solution makes her shakey. Out of Tussionex so this will be refilled.  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 Has about 2 Covid-19 patients per day.  I think prednisone is indicated at this time to see if cough will improve. Also, may still have bacterial lower respiratory infection. Will place her on Levaquin 500 mg daily x 10 days and Prednisone 10mg  taper #21 tabs going from 60 mg to 0 mg over 7 days  Should not be around Covid-19 patients if on steroids. Note provided to not be around Covid-19 patients x 2 weeks. Have refilled Tussionex. Has Diflucan if needed for Candida vaginitis. Continue inhalers.  Is afebrile she says.  Has been taking Mucinex DM.  Mucinex D caused her to be hypertensive she says.           Review of Systems .  Monitor    Objective:   Physical Exam Seen virtually at work in no acute distress with occasional cough that sounds slightly congested.  Reports that she is afebrile.  No audible wheezing.  Does not appear to be tachypnea       Assessment & Plan:  Protracted lower respiratory infection  Plan: Patient  will take prednisone 10 mg (#21) going from 60 mg to 0 mg over 7 days.  During the next 2 weeks she should not be around 9 COVID-19 patients as she is on steroids.  She will continue with inhalers as previously prescribed.  Tussionex refill.  Take Levaquin 500 mg daily for 10 days.  Offered chest x-ray but patient declined.  Written will be emailed to her regarding work restrictions.  Time spent including writing work excuse note, reviewing previous visit and other medical records as well as interviewing patient is 20 minutes.

## 2019-04-17 MED FILL — ETONOGESTREL-ETHINYL ESTRAD: 0.12-0.015 | 84 days supply | Qty: 3 | Fill #1

## 2019-04-21 ENCOUNTER — Other Ambulatory Visit: Payer: No Typology Code available for payment source | Admitting: Internal Medicine

## 2019-04-21 ENCOUNTER — Other Ambulatory Visit: Payer: Self-pay

## 2019-04-21 DIAGNOSIS — Z1329 Encounter for screening for other suspected endocrine disorder: Secondary | ICD-10-CM

## 2019-04-21 DIAGNOSIS — E559 Vitamin D deficiency, unspecified: Secondary | ICD-10-CM

## 2019-04-21 DIAGNOSIS — E781 Pure hyperglyceridemia: Secondary | ICD-10-CM

## 2019-04-21 DIAGNOSIS — Z0000 Encounter for general adult medical examination without abnormal findings: Secondary | ICD-10-CM

## 2019-04-21 LAB — CBC WITH DIFFERENTIAL/PLATELET
Absolute Monocytes: 501 cells/uL (ref 200–950)
Basophils Absolute: 55 cells/uL (ref 0–200)
Basophils Relative: 0.5 %
Eosinophils Absolute: 65 cells/uL (ref 15–500)
Eosinophils Relative: 0.6 %
HCT: 41.6 % (ref 35.0–45.0)
Hemoglobin: 13.3 g/dL (ref 11.7–15.5)
Lymphs Abs: 2801 cells/uL (ref 850–3900)
MCH: 27 pg (ref 27.0–33.0)
MCHC: 32 g/dL (ref 32.0–36.0)
MCV: 84.6 fL (ref 80.0–100.0)
MPV: 10.7 fL (ref 7.5–12.5)
Monocytes Relative: 4.6 %
Neutro Abs: 7477 cells/uL (ref 1500–7800)
Neutrophils Relative %: 68.6 %
Platelets: 312 10*3/uL (ref 140–400)
RBC: 4.92 10*6/uL (ref 3.80–5.10)
RDW: 13.1 % (ref 11.0–15.0)
Total Lymphocyte: 25.7 %
WBC: 10.9 10*3/uL — ABNORMAL HIGH (ref 3.8–10.8)

## 2019-04-21 LAB — COMPLETE METABOLIC PANEL WITH GFR
AG Ratio: 1.5 (calc) (ref 1.0–2.5)
ALT: 10 U/L (ref 6–29)
AST: 12 U/L (ref 10–30)
Albumin: 4 g/dL (ref 3.6–5.1)
Alkaline phosphatase (APISO): 50 U/L (ref 31–125)
BUN: 13 mg/dL (ref 7–25)
CO2: 25 mmol/L (ref 20–32)
Calcium: 9.2 mg/dL (ref 8.6–10.2)
Chloride: 106 mmol/L (ref 98–110)
Creat: 0.74 mg/dL (ref 0.50–1.10)
GFR, Est African American: 125 mL/min/{1.73_m2} (ref >=60)
GFR, Est Non African American: 108 mL/min/{1.73_m2} (ref >=60)
Globulin: 2.6 g/dL (calc) (ref 1.9–3.7)
Glucose, Bld: 80 mg/dL (ref 65–99)
Potassium: 4.1 mmol/L (ref 3.5–5.3)
Sodium: 139 mmol/L (ref 135–146)
Total Bilirubin: 0.4 mg/dL (ref 0.2–1.2)
Total Protein: 6.6 g/dL (ref 6.1–8.1)

## 2019-04-21 LAB — LIPID PANEL
Cholesterol: 186 mg/dL (ref <200)
HDL: 52 mg/dL (ref >=50)
LDL Cholesterol (Calc): 109 mg/dL (calc) — ABNORMAL HIGH
Non-HDL Cholesterol (Calc): 134 mg/dL (calc) — ABNORMAL HIGH (ref <130)
Total CHOL/HDL Ratio: 3.6 (calc) (ref <5.0)
Triglycerides: 132 mg/dL (ref <150)

## 2019-04-21 LAB — VITAMIN D 25 HYDROXY (VIT D DEFICIENCY, FRACTURES): Vit D, 25-Hydroxy: 43 ng/mL (ref 30–100)

## 2019-04-21 LAB — TSH: TSH: 1.67 mIU/L

## 2019-04-24 ENCOUNTER — Ambulatory Visit (INDEPENDENT_AMBULATORY_CARE_PROVIDER_SITE_OTHER): Payer: No Typology Code available for payment source | Admitting: Internal Medicine

## 2019-04-24 ENCOUNTER — Encounter: Payer: Self-pay | Admitting: Internal Medicine

## 2019-04-24 ENCOUNTER — Other Ambulatory Visit: Payer: Self-pay

## 2019-04-24 VITALS — BP 120/80 | HR 100 | Temp 97.9°F | Ht 64.75 in | Wt 222.0 lb

## 2019-04-24 DIAGNOSIS — Z0000 Encounter for general adult medical examination without abnormal findings: Secondary | ICD-10-CM

## 2019-04-24 DIAGNOSIS — J3089 Other allergic rhinitis: Secondary | ICD-10-CM

## 2019-04-24 DIAGNOSIS — Z8709 Personal history of other diseases of the respiratory system: Secondary | ICD-10-CM

## 2019-04-24 DIAGNOSIS — F411 Generalized anxiety disorder: Secondary | ICD-10-CM | POA: Diagnosis not present

## 2019-04-24 DIAGNOSIS — M7918 Myalgia, other site: Secondary | ICD-10-CM

## 2019-04-24 DIAGNOSIS — K580 Irritable bowel syndrome with diarrhea: Secondary | ICD-10-CM

## 2019-04-24 LAB — POCT URINALYSIS DIPSTICK
Appearance: NEGATIVE
Bilirubin, UA: NEGATIVE
Blood, UA: NEGATIVE
Glucose, UA: NEGATIVE
Ketones, UA: NEGATIVE
Leukocytes, UA: NEGATIVE
Nitrite, UA: NEGATIVE
Odor: NEGATIVE
Protein, UA: NEGATIVE
Spec Grav, UA: 1.01 (ref 1.010–1.025)
Urobilinogen, UA: 0.2 E.U./dL
pH, UA: 6.5 (ref 5.0–8.0)

## 2019-04-24 NOTE — Patient Instructions (Addendum)
Referral to allergist for exercise induced asthma and allergies. Continue to work on diet and exercise.  Continue current medications.  It was a pleasure to see you today.

## 2019-04-24 NOTE — Progress Notes (Signed)
Subjective:    Patient ID: Kristina Stewart, female    DOB: [birth year], 33 y.o.   MRN: [id number]  HPI 33 year old Female for health maintenance exam and evaluation of medical issues.  She has a history of allergic rhinitis and food allergies.  Food allergies including almonds cherries apples and plum.  She gets an itchy throat when she eats these.  Recently had protracted productive cough and respiratory symptoms.  She tested negative for COVID-19.  Was treated with Tussionex, Zithromax, inhalers and nebulizer treatment but cough persisted.  Subsequently was given prednisone and Levaquin.  Finally getting better with regard to cough.  History of asthma.   patient would like allergy referral.  History of irritable bowel symptoms that have improved with judicious use of Xanax.  History of anxiety and has anxiety about her health.  History of benign positional vertigo in 2013  History of TMJ syndrome  Social history: She is married.  Has a 74-year-old son.  She moved here from [place].  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  Husband is a high school Music therapist.  She does not smoke.  Rare alcohol consumption.  Family history: Both parents with history of hypertension.  1 brother in good health.  1 sister with history of Fragile X syndrome and attention deficit hyperactivity disorder.  Patient has a history of MTHFR gene mutation.  She saw Dr. Beryle Beams in the past who did not think this was a significant concern.  She had a craniotomy in May 2006 for Arnold-Chiari malformation in [place].  Has had musculoskeletal pain over the past few months but she attributes that to having to push sonography machine to various parts of the hospital to perform studies.  She saw Dr. Jaynee Eagles in April for ulnar neuropathy of both upper extremities.  She was referred to physical therapy and improved.  She had an extensive work-up including MRI of the C-spine and MRI of the brain.  She also was  treated with a course of steroids for the bilateral ulnar neuropathy.  Labs reviewed.  Her LDL cholesterol is 109.  Vitamin D improved from 19 to 43.  TSH is normal.  CBC and C met within normal limits.  Dipstick urinalysis is normal.    Review of Systems cough is gone.  Complains of resting tachycardia but denies palpitations or skipped beats.     Objective:   Physical Exam Has lost 8 pounds since August.  Blood pressure 120/80, pulse 100 regular.  Weight 222 pounds.  BMI 37.23.  Skin warm and dry.  Nodes none.  No thyromegaly.  Chest clear to auscultation.  TMs are clear.  Neck is supple.  Cardiac exam regular rate and rhythm normal S1 and S2 without murmurs or gallops.  Abdomen obese soft nondistended without hepatosplenomegaly masses or tenderness.  No lower extremity edema.  Neuro no gross focal deficits on brief exam.  She is alert and oriented.  Affect and judgment are within normal limits.       Assessment & Plan:  Anxiety state-related to issues of stressful work environment and COVID-19 pandemic.  Discussed at length.  May benefit from counseling.  Irritable bowel syndrome treated with Xanax which also helps anxiety symptoms  Allergic rhinitis and asthma-patient feels the allergy referral would be helpful and getting better control over these issues.  Allergy referral will be made.  Health maintenance-will have flu vaccine through employment.  Tetanus immunization is up-to-date.  History of surgery  for Chiari malformation with recent neurological evaluation showing no issues with this.  Bilateral ulnar neuropathy improved with physical therapy and prednisone  Musculoskeletal pain related to physical activity at [facility] continue with Flexeril as needed  History of vitamin D deficiency-vitamin D level is now within normal limits  Plan: Allergy referral will be made.  Continue current medications and follow-up in 1 year or as needed.

## 2019-04-28 NOTE — Patient Instructions (Signed)
It was a pleasure to see you virtually today.  Take prednisone in tapering course going from 60 mg to 0 mg over 7 days.  Avoid COVID-19 patients over the next 2 weeks since you are on immunosuppressive medication.  Tussionex refilled.  Use inhalers as previously prescribed.  Levaquin 500 mg daily for 10 days.

## 2019-05-01 MED FILL — ETONOGESTREL-ETHINYL ESTRAD: 0.12-0.015 | 84 days supply | Qty: 3 | Fill #1

## 2019-05-26 MED FILL — FLUTICASONE PROP 50 MCG SPR: 50 | 30 days supply | Qty: 16 | Fill #1

## 2019-06-05 ENCOUNTER — Telehealth: Payer: No Typology Code available for payment source | Admitting: Physician Assistant

## 2019-06-05 DIAGNOSIS — J0110 Acute frontal sinusitis, unspecified: Secondary | ICD-10-CM

## 2019-06-05 MED ORDER — AMOXICILLIN-POT CLAVULANATE 875-125 MG PO TABS: 1.0000 | ORAL_TABLET | Freq: Two times a day (BID) | ORAL | 0 refills | Status: DC

## 2019-06-05 MED FILL — AMOX-CLAV 875-125 MG TABLET: 875-125 | 10 days supply | Qty: 20 | Fill #0

## 2019-06-05 NOTE — Progress Notes (Signed)
We are sorry that you are not feeling well.  Here is how we plan to help!  Based on what you have shared with me it looks like you have sinusitis.  Sinusitis is inflammation and infection in the sinus cavities of the head.  Based on your presentation I believe you most likely have Acute Bacterial Sinusitis.  This is an infection caused by bacteria and is treated with antibiotics. I have prescribed Augmentin 875mg /125mg  one tablet twice daily with food, for 7 days. You may use an oral decongestant such as Mucinex D or if you have glaucoma or high blood pressure use plain Mucinex. Saline nasal spray help and can safely be used as often as needed for congestion.  If you develop worsening sinus pain, fever or notice severe headache and vision changes, or if symptoms are not better after completion of antibiotic, please schedule an appointment with a health care provider.    Sinus infections are not as easily transmitted as other respiratory infection, however we still recommend that you avoid close contact with loved ones, especially the very young and elderly. Especially in the context of the covid 19 virus, you should be cautious and stay home for at least 10 days from symptom onset AND until you are fever free for more than 72 hours. Remember to wash your hands thoroughly throughout the day as this is the number one way to prevent the spread of infection!  Home Care:  Only take medications as instructed by your medical team.  Complete the entire course of an antibiotic.  Do not take these medications with alcohol.  A steam or ultrasonic humidifier can help congestion.  You can place a towel over your head and breathe in the steam from hot water coming from a faucet.  Avoid close contacts especially the very young and the elderly.  Cover your mouth when you cough or sneeze.  Always remember to wash your hands.  Get Help Right Away If:  You develop worsening fever or sinus pain.  You develop a  severe head ache or visual changes.  Your symptoms persist after you have completed your treatment plan.  Make sure you  Understand these instructions.  Will watch your condition.  Will get help right away if you are not doing well or get worse.  Your e-visit answers were reviewed by a board certified advanced clinical practitioner to complete your personal care plan.  Depending on the condition, your plan could have included both over the counter or prescription medications.  If there is a problem please reply  once you have received a response from your provider.  Your safety is important to [place].  If you have drug allergies check your prescription carefully.    You can use MyChart to ask questions about today's visit, request a non-urgent call back, or ask for a work or school excuse for 24 hours related to this e-Visit. If it has been greater than 24 hours you will need to follow up with your provider, or enter a new e-Visit to address those concerns.  You will get an e-mail in the next two days asking about your experience.  I hope that your e-visit has been valuable and will speed your recovery. Thank you for using e-visits.  5 minutes spent on this chart

## 2019-06-12 ENCOUNTER — Other Ambulatory Visit: Payer: Self-pay | Admitting: *Deleted

## 2019-06-12 ENCOUNTER — Encounter: Payer: Self-pay | Admitting: Allergy

## 2019-06-12 ENCOUNTER — Ambulatory Visit (INDEPENDENT_AMBULATORY_CARE_PROVIDER_SITE_OTHER): Payer: No Typology Code available for payment source | Admitting: Allergy

## 2019-06-12 ENCOUNTER — Other Ambulatory Visit: Payer: Self-pay

## 2019-06-12 ENCOUNTER — Telehealth: Payer: Self-pay | Admitting: *Deleted

## 2019-06-12 VITALS — BP 110/70 | HR 107 | Temp 97.3°F | Resp 16 | Ht 65.0 in | Wt 228.0 lb

## 2019-06-12 DIAGNOSIS — T781XXD Other adverse food reactions, not elsewhere classified, subsequent encounter: Secondary | ICD-10-CM

## 2019-06-12 DIAGNOSIS — J4520 Mild intermittent asthma, uncomplicated: Secondary | ICD-10-CM | POA: Insufficient documentation

## 2019-06-12 DIAGNOSIS — T781XXA Other adverse food reactions, not elsewhere classified, initial encounter: Secondary | ICD-10-CM | POA: Insufficient documentation

## 2019-06-12 DIAGNOSIS — J3089 Other allergic rhinitis: Secondary | ICD-10-CM | POA: Diagnosis not present

## 2019-06-12 DIAGNOSIS — L301 Dyshidrosis [pompholyx]: Secondary | ICD-10-CM | POA: Insufficient documentation

## 2019-06-12 DIAGNOSIS — T7819XA Other adverse food reactions, not elsewhere classified, initial encounter: Secondary | ICD-10-CM | POA: Insufficient documentation

## 2019-06-12 DIAGNOSIS — H1013 Acute atopic conjunctivitis, bilateral: Secondary | ICD-10-CM | POA: Insufficient documentation

## 2019-06-12 MED ORDER — EUCRISA 2 % EX OINT: 1.0000 "application " | TOPICAL_OINTMENT | Freq: Two times a day (BID) | CUTANEOUS | 5 refills | Status: DC | PRN

## 2019-06-12 MED ORDER — MONTELUKAST SODIUM 10 MG PO TABS: 10.0000 mg | ORAL_TABLET | Freq: Every day | ORAL | 3 refills | Status: DC

## 2019-06-12 MED ORDER — AZELASTINE-FLUTICASONE 137-50 MCG/ACT NA SUSP: 1.0000 | Freq: Two times a day (BID) | NASAL | 5 refills | Status: DC

## 2019-06-12 MED ORDER — EPINEPHRINE 0.3 MG/0.3ML IJ SOAJ: 0.3000 mg | Freq: Once | INTRAMUSCULAR | 1 refills | Status: AC

## 2019-06-12 MED ORDER — OLOPATADINE HCL 0.1 % OP SOLN: 1.0000 [drp] | Freq: Two times a day (BID) | OPHTHALMIC | 5 refills | Status: DC | PRN

## 2019-06-12 MED FILL — EPINEPHRINE 0.3 MG AUTO-INJ: 0.3 | 30 days supply | Qty: 2 | Fill #0

## 2019-06-12 MED FILL — AZELASTINE-FLUTICASONE 137-: 137-50 | 30 days supply | Qty: 23 | Fill #0

## 2019-06-12 MED FILL — OLOPATADINE HCL 0.1% EYE DR: 0.1 | 18 days supply | Qty: 5 | Fill #0
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
## 2019-06-12 NOTE — Assessment & Plan Note (Signed)
Dyshidrotic Rodick eczema on the palms bilaterally.  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  Uses topical steroid cream with some benefit.  Try Eucrisa twice a day on the hands - this is a non-steroidal cream. Samples given.  Discussed proper skin care.

## 2019-06-12 NOTE — Assessment & Plan Note (Signed)
Perennial rhinoconjunctivitis symptoms for the last 10+ years with worsening during change of season.  Currently takes Xyzal, Flonase 2 sprays daily, and azelastine eyedrops with some benefit.  Skin testing in 2012 showed multiple positives and was going to start on allergy immunotherapy but did not. Today's skin testing showed: Positive to grass, weed, ragweed, trees, mold.   Start environmental control measures.  Start Singulair (montelukast) 10mg  daily at night. Cautioned that in some children/adults can experience behavioral changes including hyperactivity, agitation, depression, sleep disturbances and suicidal ideations. These side effects are rare, but if you notice them you should notify me and discontinue Singulair (montelukast).  May use over the counter antihistamines such as Zyrtec (cetirizine), Claritin (loratadine), Allegra (fexofenadine), or Xyzal (levocetirizine) daily as needed.  May take twice a day for breakthrough symptoms.  May use Patanol 0.1% eye drop 1 drop in each eye twice daily as needed for itchy/watery eyes.   May use dymista 1 spray twice a day. This replaces Flonase.   Discussed the risks and benefits of allergy immunotherapy and patient consented to starting.

## 2019-06-12 NOTE — Telephone Encounter (Signed)
PA for Georga Hacking was initiated through [email or web address] and awaiting approval from insurance company.

## 2019-06-12 NOTE — Telephone Encounter (Signed)
Please call patient and ask her what steroid cream she has at home first.  If she has one at home, then we can go ahead with the PA.  If she doesn't, then I will send in triamcinolone. If it doesn't work, have her call us back and let us know that way we have it in our system that she tried it and then we can get [place] PA.

## 2019-06-12 NOTE — Assessment & Plan Note (Signed)
Diagnosed with asthma in middle school and was doing well up until a few years ago.  Main triggers include allergies, weather change, infection, strong scents and perfumes.  During these flares she takes Advair 250 1 puff twice a day, Singulair 10 mg daily and albuterol as needed with good benefit.  No flare the last 2 weeks.  Today's spirometry was normal. . Daily controller medication(s): none . Prior to physical activity: May use albuterol rescue inhaler 2 puffs 5 to 15 minutes prior to strenuous physical activities. Marland Kitchen Rescue medications: May use albuterol rescue inhaler 2 puffs or nebulizer every 4 to 6 hours as needed for shortness of breath, chest tightness, coughing, and wheezing. Monitor frequency of use.  . During upper respiratory infections/asthma flares: Start Advair Diskus 250 1 puff twice a day and rinse mouth afterwards for 1-2 week.

## 2019-06-12 NOTE — Telephone Encounter (Signed)
Amcinonide 0.1% cream is a high potency topical steroid. That should meet the criteria for the [place] PA. Please go ahead with Eucrisa PA and place that tried amcinonide 0.1% cream.   Thank you

## 2019-06-12 NOTE — Assessment & Plan Note (Addendum)
   See assessment and plan as above for allergic rhinitis.  

## 2019-06-12 NOTE — Patient Instructions (Addendum)
Today's skin testing showed: Positive to grass, weed, ragweed, trees, mold and borderline to almond.  Negative to pistachio.   Allergic rhino conjunctivitis:  Start environmental control measures as below. Start Singulair (montelukast) 10mg  daily at night. Cautioned that in some children/adults can experience behavioral changes including hyperactivity, agitation, depression, sleep disturbances and suicidal ideations. These side effects are rare, but if you notice them you should notify me and discontinue Singulair (montelukast).  May use over the counter antihistamines such as Zyrtec (cetirizine), Claritin (loratadine), Allegra (fexofenadine), or Xyzal (levocetirizine) daily as needed.  May take twice a day for breakthrough symptoms.  May use Patanol 1 drop in each eye twice daily as needed for itchy/watery eyes.   May use dymista 1 spray twice a day. This replaces Flonase.   Had a detailed discussion with patient/family that clinical history is suggestive of allergic rhinitis, and may benefit from allergy immunotherapy (AIT). Discussed in detail regarding the dosing, schedule, side effects (mild to moderate local allergic reaction and rarely systemic allergic reactions including anaphylaxis), and benefits (significant improvement in nasal symptoms, seasonal flares of asthma) of immunotherapy with the patient. There is significant time commitment involved with allergy shots, which includes weekly immunotherapy injections for first 9-12 months and then biweekly to monthly injections for 3-5 years.   Start allergy injections.  Asthma:  Today's spirometry was normal. . Daily controller medication(s): none . Prior to physical activity: May use albuterol rescue inhaler 2 puffs 5 to 15 minutes prior to strenuous physical activities. Rescue medications: May use albuterol rescue inhaler 2 puffs or nebulizer every 4 to 6 hours as needed for shortness of breath, chest tightness, coughing, and  wheezing. Monitor frequency of use.  . During upper respiratory infections/asthma flares: Start Advair Diskus 250 1 puff twice a day and rinse mouth afterwards for 1-2 week. . Asthma control goals:  o Full participation in all desired activities (may need albuterol before activity) o Albuterol use two times or less a week on average (not counting use with activity) o Cough interfering with sleep two times or less a month o Oral steroids no more than once a year o No hospitalizations  Food:  Avoid fresh fruits and almonds for now.  May try pistachios at home.  Discussed that her food triggered oral and throat symptoms are likely caused by oral food allergy syndrome (OFAS). This is caused by cross reactivity of pollen with fresh fruits and vegetables, and nuts. Symptoms are usually localized in the form of itching and burning in mouth and throat. Very rarely it can progress to more severe symptoms. Eating foods in cooked or processed forms usually minimizes symptoms. I recommended avoidance of eating the problem foods, especially during the peak season(s). Sometimes, OFAS can induce severe throat swelling or even a systemic reaction; with such instance, I advised them to report to a local ER. A list of common pollens and food cross-reactivities was provided to the patient.   Eczema:  See below for skin care.  Try Eucrisa twice a day on the hands - this is a non-steroidal cream. Samples given.  Place in refrigerator if it burns.  Follow up in 3 months or sooner if needed.    Skin care recommendations  Bath time: . Always use lukewarm water. AVOID very hot or cold water. Marland Kitchen Keep bathing time to 5-10 minutes. . Do NOT use bubble bath. . Use a mild soap and use just enough to wash the dirty areas. . Do NOT scrub skin  vigorously.  . After bathing, pat dry your skin with a towel. Do NOT rub or scrub the skin.  Moisturizers and prescriptions:  . ALWAYS apply moisturizers immediately  after bathing (within 3 minutes). This helps to lock-in moisture. . Use the moisturizer several times a day over the whole body. Kermit Balo summer moisturizers include: Aveeno, CeraVe, Cetaphil. Kermit Balo winter moisturizers include: Aquaphor, Vaseline, Cerave, Cetaphil, Eucerin, Vanicream. . When using moisturizers along with medications, the moisturizer should be applied about one hour after applying the medication to prevent diluting effect of the medication or moisturize around where you applied the medications. When not using medications, the moisturizer can be continued twice daily as maintenance.  Laundry and clothing: . Avoid laundry products with added color or perfumes. . Use unscented hypo-allergenic laundry products such as Tide free, Cheer free & gentle, and All free and clear.  . If the skin still seems dry or sensitive, you can try double-rinsing the clothes. . Avoid tight or scratchy clothing such as wool. . Do not use fabric softeners or dyer sheets.     Reducing Pollen Exposure . Pollen seasons: trees (spring), grass (summer) and ragweed/weeds (fall). Marland Kitchen Keep windows closed in your home and car to lower pollen exposure.  Susa Simmonds air conditioning in the bedroom and throughout the house if possible.  . Avoid going out in dry windy days - especially early morning. . Pollen counts are highest between 5 - 10 AM and on dry, hot and windy days.  . Save outside activities for late afternoon or after a heavy rain, when pollen levels are lower.  . Avoid mowing of grass if you have grass pollen allergy. Marland Kitchen Be aware that pollen can also be transported indoors on people and pets.  . Dry your clothes in an automatic dryer rather than hanging them outside where they might collect pollen.  . Rinse hair and eyes before bedtime. Mold Control . Mold and fungi can grow on a variety of surfaces provided certain temperature and moisture conditions exist.  . Outdoor molds grow on plants, decaying  vegetation and soil. The major outdoor mold, Alternaria and Cladosporium, are found in very high numbers during hot and dry conditions. Generally, a late summer - fall peak is seen for common outdoor fungal spores. Rain will temporarily lower outdoor mold spore count, but counts rise rapidly when the rainy period ends. . The most important indoor molds are Aspergillus and Penicillium. Dark, humid and poorly ventilated basements are ideal sites for mold growth. The next most common sites of mold growth are the bathroom and the kitchen. Outdoor (Seasonal) Mold Control . Use air conditioning and keep windows closed. . Avoid exposure to decaying vegetation. Marland Kitchen Avoid leaf raking. . Avoid grain handling. . Consider wearing a face mask if working in moldy areas.  Indoor (Perennial) Mold Control  . Maintain humidity below 50%. . Get rid of mold growth on hard surfaces with water, detergent and, if necessary, 5% bleach (do not mix with other cleaners). Then dry the area completely. If mold covers an area more than 10 square feet, consider hiring an indoor environmental professional. . For clothing, washing with soap and water is best. If moldy items cannot be cleaned and dried, throw them away. . Remove sources e.g. contaminated carpets. . Repair and seal leaking roofs or pipes. Using dehumidifiers in damp basements may be helpful, but empty the water and clean units regularly to prevent mildew from forming. All rooms, especially basements, bathrooms and  kitchens, require ventilation and cleaning to deter mold and mildew growth. Avoid carpeting on concrete or damp floors, and storing items in damp areas.

## 2019-06-12 NOTE — Assessment & Plan Note (Signed)
Certain raw fruits and almonds cause perioral pruritus.  Tolerates processed and cooked forms with no issues.  Patient questionably had a reaction to pistachios but it was cross contaminated almonds in the past.  She tolerates other tree nuts including cashews, walnuts, pecans and hazelnuts with no issues  Today skin testing was borderline positive to almonds and negative pistachios.  Avoid fresh fruits and almonds for now.  May try pistachios at home.  Discussed that her food triggered oral and throat symptoms are likely caused by oral food allergy syndrome (OFAS). This is caused by cross reactivity of pollen with fresh fruits and vegetables, and nuts. Symptoms are usually localized in the form of itching and burning in mouth and throat. Very rarely it can progress to more severe symptoms. Eating foods in cooked or processed forms usually minimizes symptoms. I recommended avoidance of eating the problem foods, especially during the peak season(s). Sometimes, OFAS can induce severe throat swelling or even a systemic reaction; with such instance, I advised them to report to a local ER. A list of common pollens and food cross-reactivities was provided to the patient.  This may improve with improvement of her pollen allergy once allergy immunotherapy is started

## 2019-06-12 NOTE — Telephone Encounter (Signed)
Spoke with patient. She has Amcinonide 0.1% cream at home.

## 2019-06-12 NOTE — Telephone Encounter (Signed)
Kristina Stewart is not covered on the patient's insurance. Pharmacy states that corticosteroid is required to try and fail first. Desonide and triamcinolone are preferred. Please advise.

## 2019-06-13 NOTE — Progress Notes (Signed)
Vials exp 06-12-20 

## 2019-06-14 DIAGNOSIS — J301 Allergic rhinitis due to pollen: Secondary | ICD-10-CM

## 2019-06-14 NOTE — Telephone Encounter (Signed)
Approved and sent pharmacy as well as scan center.

## 2019-06-15 DIAGNOSIS — J302 Other seasonal allergic rhinitis: Secondary | ICD-10-CM | POA: Diagnosis not present

## 2019-06-16 ENCOUNTER — Telehealth: Payer: Self-pay | Admitting: *Deleted

## 2019-06-16 ENCOUNTER — Telehealth: Payer: No Typology Code available for payment source | Admitting: Physician Assistant

## 2019-06-16 DIAGNOSIS — R21 Rash and other nonspecific skin eruption: Secondary | ICD-10-CM | POA: Diagnosis not present

## 2019-06-16 MED ORDER — TRIAMCINOLONE ACETONIDE 0.1 % EX CREA: 1.0000 "application " | TOPICAL_CREAM | Freq: Four times a day (QID) | CUTANEOUS | 0 refills | Status: DC | PRN

## 2019-06-16 MED FILL — TRIAMCINOLONE 0.1% CREAM: 0.1 | 15 days supply | Qty: 30 | Fill #0

## 2019-06-16 NOTE — Telephone Encounter (Signed)
Left message to call the [place] office.

## 2019-06-16 NOTE — Telephone Encounter (Signed)
Dr. Kim please advise 

## 2019-06-16 NOTE — Telephone Encounter (Signed)
Patient stated that she had IDs done on her arms on Monday, December 14th and the bumps on her arms still haven't gone away. They are raised and red and mildly itchy. Patient would like advise on what to do.

## 2019-06-16 NOTE — Telephone Encounter (Signed)
Please call patient.  Ask her to take picture and send via mychart.  She can take benadryl 25mg  every 4-6 hours as needed for itching.  Also may apply over the counter hydrocortisone cream.

## 2019-06-16 NOTE — Telephone Encounter (Signed)
Patient called back and was informed. She stated that Dr. Maudie Mercury was not under her providers in epic. I spoke with [name] and she has added Dr. Maudie Mercury to patient's mychart. Patient will go on mychart and send a picture.

## 2019-06-16 NOTE — Telephone Encounter (Signed)
Also sent patient a message via MyChart with Dr.Kim's instructions.

## 2019-06-16 NOTE — Progress Notes (Signed)
E Visit for Rash  We are sorry that you are not feeling well. Here is how we plan to help!  Thank you for the extra information.  It was helpful.  It does not look like impetigo.  I would stop using the Bactroban.  As you have had eczema in the past I suspect it is a small patch of eczema.  This does not appear to be a fungal rash.  It does not look like shingles.  It does not appear to be infected.  It does not look like an allergic reaction.  I'm going to try you on a stronger steroid cream - triamcinolone.  If it opens or begins to ooze or develops a honey colored crust he may go back to using the Bactroban.  In the meantime I would utilize Eucerin cream to help keep the area moist between doses of steroid.  If no improvement in 1 week or if symptoms worsen I would like for you to have a face-to-face visit with your primary care or dermatology.  Additionally you can start taking Zyrtec 10 mg 2 times per day to help with the itching.   HOME CARE:   Take cool showers and avoid direct sunlight.  Apply cool compress or wet dressings.  Take a bath in an oatmeal bath.  Sprinkle content of one Aveeno packet under running faucet with comfortably warm water.  Bathe for 15-20 minutes, 1-2 times daily.  Pat dry with a towel. Do not rub the rash.  Use hydrocortisone cream.  Take an antihistamine like Benadryl for widespread rashes that itch.  The adult dose of Benadryl is 25-50 mg by mouth 4 times daily.  Caution:  This type of medication may cause sleepiness.  Do not drink alcohol, drive, or operate dangerous machinery while taking antihistamines.  Do not take these medications if you have prostate enlargement.  Read package instructions thoroughly on all medications that you take.  GET HELP RIGHT AWAY IF:   Symptoms don't go away after treatment.  Severe itching that persists.  If you rash spreads or swells.  If you rash begins to smell.  If it blisters and opens or develops a  yellow-brown crust.  You develop a fever.  You have a sore throat.  You become short of breath.  MAKE SURE YOU:  Understand these instructions. Will watch your condition. Will get help right away if you are not doing well or get worse.  Thank you for choosing an e-visit. Your e-visit answers were reviewed by a board certified advanced clinical practitioner to complete your personal care plan. Depending upon the condition, your plan could have included both over the counter or prescription medications. Please review your pharmacy choice. Be sure that the pharmacy you have chosen is open so that you can pick up your prescription now.  If there is a problem you may message your provider in [place] to have the prescription routed to another pharmacy. Your safety is important to [place]. If you have drug allergies check your prescription carefully.  For the next 24 hours, you can use MyChart to ask questions about today's visit, request a non-urgent call back, or ask for a work or school excuse from your e-visit provider. You will get an email in the next two days asking about your experience. I hope that your e-visit has been valuable and will speed your recovery.   Greater than 5 minutes, yet less than 10 minutes of time have been spent researching, coordinating,  and implementing care for this patient today

## 2019-06-19 MED FILL — EUCRISA 2 % OINT: 2 | 30 days supply | Qty: 100 | Fill #0

## 2019-07-06 ENCOUNTER — Other Ambulatory Visit: Payer: Self-pay

## 2019-07-06 ENCOUNTER — Ambulatory Visit (INDEPENDENT_AMBULATORY_CARE_PROVIDER_SITE_OTHER): Payer: No Typology Code available for payment source

## 2019-07-06 DIAGNOSIS — J3089 Other allergic rhinitis: Secondary | ICD-10-CM | POA: Diagnosis not present

## 2019-07-06 NOTE — Progress Notes (Signed)
Immunotherapy   Patient Details  Name: Kristina Stewart MRN: [id number] Date of Birth: [birth year]  07/06/2019  Kristina Stewart started injections for  **MOLD, G-W-RW-T* Following schedule: B  Frequency:1 time per week Epi-Pen:Epi-Pen Available  Consent signed and patient instructions given. Patient started allergy injections today and received .40ml of MOLD in RUA and .62ml of G-W-RW-T in the LUA. Patient waited 30 minutes and did not experience any issues.   Jonita Albee 07/06/2019, 8:47 AM

## 2019-07-13 ENCOUNTER — Ambulatory Visit (INDEPENDENT_AMBULATORY_CARE_PROVIDER_SITE_OTHER): Payer: No Typology Code available for payment source

## 2019-07-13 DIAGNOSIS — J3089 Other allergic rhinitis: Secondary | ICD-10-CM

## 2019-07-19 ENCOUNTER — Ambulatory Visit (INDEPENDENT_AMBULATORY_CARE_PROVIDER_SITE_OTHER): Payer: No Typology Code available for payment source

## 2019-07-19 DIAGNOSIS — J3089 Other allergic rhinitis: Secondary | ICD-10-CM | POA: Diagnosis not present

## 2019-07-31 MED FILL — MONTELUKAST SOD 10 MG TAB: 10 | 90 days supply | Qty: 90 | Fill #0

## 2019-07-31 MED FILL — AZELASTINE-FLUTICASONE 137-: 137-50 | 30 days supply | Qty: 23 | Fill #0

## 2019-08-01 ENCOUNTER — Ambulatory Visit (INDEPENDENT_AMBULATORY_CARE_PROVIDER_SITE_OTHER): Payer: No Typology Code available for payment source

## 2019-08-01 DIAGNOSIS — J3089 Other allergic rhinitis: Secondary | ICD-10-CM | POA: Diagnosis not present

## 2019-08-11 ENCOUNTER — Ambulatory Visit (INDEPENDENT_AMBULATORY_CARE_PROVIDER_SITE_OTHER): Payer: No Typology Code available for payment source | Admitting: *Deleted

## 2019-08-11 DIAGNOSIS — J309 Allergic rhinitis, unspecified: Secondary | ICD-10-CM

## 2019-08-14 ENCOUNTER — Ambulatory Visit (INDEPENDENT_AMBULATORY_CARE_PROVIDER_SITE_OTHER): Payer: No Typology Code available for payment source

## 2019-08-14 DIAGNOSIS — J309 Allergic rhinitis, unspecified: Secondary | ICD-10-CM | POA: Diagnosis not present

## 2019-08-24 ENCOUNTER — Ambulatory Visit (INDEPENDENT_AMBULATORY_CARE_PROVIDER_SITE_OTHER): Payer: No Typology Code available for payment source

## 2019-08-24 DIAGNOSIS — J309 Allergic rhinitis, unspecified: Secondary | ICD-10-CM | POA: Diagnosis not present

## 2019-09-01 ENCOUNTER — Ambulatory Visit (INDEPENDENT_AMBULATORY_CARE_PROVIDER_SITE_OTHER): Payer: No Typology Code available for payment source | Admitting: *Deleted

## 2019-09-01 DIAGNOSIS — J309 Allergic rhinitis, unspecified: Secondary | ICD-10-CM | POA: Diagnosis not present

## 2019-09-07 ENCOUNTER — Ambulatory Visit (INDEPENDENT_AMBULATORY_CARE_PROVIDER_SITE_OTHER): Payer: No Typology Code available for payment source

## 2019-09-07 DIAGNOSIS — J309 Allergic rhinitis, unspecified: Secondary | ICD-10-CM | POA: Diagnosis not present

## 2019-09-11 ENCOUNTER — Ambulatory Visit (INDEPENDENT_AMBULATORY_CARE_PROVIDER_SITE_OTHER): Payer: No Typology Code available for payment source | Admitting: Allergy

## 2019-09-11 ENCOUNTER — Encounter: Payer: Self-pay | Admitting: Allergy

## 2019-09-11 ENCOUNTER — Other Ambulatory Visit: Payer: Self-pay

## 2019-09-11 VITALS — BP 114/76 | HR 82 | Temp 98.1°F | Resp 18 | Ht 64.0 in | Wt 231.0 lb

## 2019-09-11 DIAGNOSIS — J4520 Mild intermittent asthma, uncomplicated: Secondary | ICD-10-CM

## 2019-09-11 DIAGNOSIS — J3089 Other allergic rhinitis: Secondary | ICD-10-CM

## 2019-09-11 DIAGNOSIS — T781XXD Other adverse food reactions, not elsewhere classified, subsequent encounter: Secondary | ICD-10-CM | POA: Diagnosis not present

## 2019-09-11 DIAGNOSIS — L301 Dyshidrosis [pompholyx]: Secondary | ICD-10-CM | POA: Diagnosis not present

## 2019-09-11 DIAGNOSIS — J302 Other seasonal allergic rhinitis: Secondary | ICD-10-CM

## 2019-09-11 DIAGNOSIS — J309 Allergic rhinitis, unspecified: Secondary | ICD-10-CM

## 2019-09-11 DIAGNOSIS — H1010 Acute atopic conjunctivitis, unspecified eye: Secondary | ICD-10-CM

## 2019-09-11 MED ORDER — TRIAMCINOLONE ACETONIDE 0.1 % EX OINT: 1.0000 "application " | TOPICAL_OINTMENT | Freq: Two times a day (BID) | CUTANEOUS | 2 refills | Status: AC

## 2019-09-11 MED FILL — TRIAMCINOLONE 0.1% OINTMEN: 0.1 | 14 days supply | Qty: 30 | Fill #0

## 2019-09-11 NOTE — Progress Notes (Signed)
Follow Up Note  RE: Kristina Stewart MRN: [id number] DOB: 04-28-1986 Date of Office Visit: 09/11/2019  Referring provider: Margaree Mackintosh, MD Primary care provider: Margaree Mackintosh, MD  Chief Complaint: Follow-up (allergy injection)  History of Present Illness: I had the pleasure of seeing Kristina Stewart for a follow up visit at the [facility] on 09/11/2019. She is a 34 y.o. female, who is being followed for asthma, allergic rhinoconjunctivitis, oral allergy syndrome and dyshidrotic eczema. Her previous allergy office visit was on 06/12/2019 with Dr. Selena Batten. Today is a regular follow up visit.  Mild intermittent asthma without complication Denies any SOB, coughing, wheezing, chest tightness, nocturnal awakenings, ER/urgent care visits or prednisone use since the last visit.  Other allergic rhinitis Started allergy injections but has been having some localized reactions to the pollen.  Currently takes allegra in the morning and Xyzal/Singulair at night. Takes dymista 1 spray twice a day. Using eye drops twice a day with good benefit.   Pollen-food allergy Had nectarines and pistachios with no issues.   Dyshidrotic eczema Eucrisa initially worked well but now not working as well. Currently having a flare from washing her hands too much after a weekend of potty training her child.   Assessment and Plan: Kristina Stewart is a 34 y.o. female with: Seasonal and perennial allergic rhinoconjunctivitis Past history - Perennial rhinoconjunctivitis symptoms for the last 10+ years with worsening during change of season. 2020 skin testing showed: Positive to grass, weed, ragweed, trees, mold. Started AIT on 07/06/2019 (MOLD, G-W-RW-T). Interim history - some localized reaction with the injections especially to pollen injection.  Continue environmental control measures.   Continue Singulair (montelukast) 10mg  daily at night.  May use over the counter antihistamines such as  Zyrtec (cetirizine), Claritin (loratadine), Allegra (fexofenadine), or Xyzal (levocetirizine) daily as needed.  May take twice a day for breakthrough symptoms.  May use Patanol 0.1% eye drop 1 drop in each eye twice daily as needed for itchy/watery eyes.   May use dymista 1 spray twice a day.   Continue allergy injections - change schedule from B to A.  Mild intermittent asthma without complication Past history - Diagnosed with asthma in middle school and was doing well up until a few years ago.  Main triggers include allergies, weather change, infection, strong scents and perfumes.  During these flares she takes Advair 250 1 puff twice a day, Singulair 10 mg daily and albuterol as needed with good benefit. 2020 spirometry was normal. Interim history - No issues.   May use albuterol rescue inhaler 2 puffs or nebulizer every 4 to 6 hours as needed for shortness of breath, chest tightness, coughing, and wheezing. May use albuterol rescue inhaler 2 puffs 5 to 15 minutes prior to strenuous physical activities. Monitor frequency of use.   During upper respiratory infections/asthma flares: Start Advair Diskus 250 1 puff twice a day and rinse mouth afterwards for 1-2 week.  Pollen-food allergy Past history - Certain raw fruits and almonds cause perioral pruritus.  Tolerates processed and cooked forms with no issues.  Patient questionably had a reaction to pistachios but it was cross contaminated almonds in the past.  She tolerates other tree nuts including cashews, walnuts, pecans and hazelnuts with no issues. 2020 skin testing was borderline positive to almonds and negative pistachios. Interim history - tolerated pistachios and nectarines at home with no issues.   Avoid fresh fruits and almonds for now.  For mild symptoms you can take  over the counter antihistamines such as Benadryl and monitor symptoms closely. If symptoms worsen or if you have severe symptoms including breathing issues, throat  closure, significant swelling, whole body hives, severe diarrhea and vomiting, lightheadedness then inject epinephrine and seek immediate medical care afterwards.  Dyshidrotic eczema Past history - Dyshidrotic eczema on the palms bilaterally.  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   Interim history - Kristina Stewart worked initially but now having flare.   Use triamcinolone 0.1% ointment twice a day for moderate eczema flares.   Use Eucrisa twice a day for mild flares.   Continue proper skin care.  Discussed Dupixent injections for eczema - handout given.   Return in about 3 months (around 12/12/2019).  Meds ordered this encounter  Medications  . triamcinolone ointment (KENALOG) 0.1 %    Sig: Apply 1 application topically 2 (two) times daily.    Dispense:  30 g    Refill:  2   Diagnostics: None.  Medication List:  Current Outpatient Medications  Medication Sig Dispense Refill  . albuterol (VENTOLIN HFA) 108 (90 Base) MCG/ACT inhaler Inhale 2 puffs into the lungs every 6 (six) hours as needed for wheezing or shortness of breath. 6.7 g prn  . Azelastine-Fluticasone 137-50 MCG/ACT SUSP Place 1 spray into the nose 2 (two) times daily. 23 g 5  . Crisaborole (EUCRISA) 2 % OINT Apply 1 application topically 2 (two) times daily as needed. 100 g 5  . cyclobenzaprine (FLEXERIL) 10 MG tablet Take 1 tablet (10 mg total) by mouth 3 (three) times daily as needed for muscle spasms. 90 tablet 0  . EPINEPHrine 0.3 mg/0.3 mL IJ SOAJ injection SMARTSIG:0.3 Milliliter(s) IM Once PRN    . levocetirizine (XYZAL ALLERGY 24HR) 5 MG tablet Take 5 mg by mouth every evening.    . montelukast (SINGULAIR) 10 MG tablet Take 1 tablet (10 mg total) by mouth at bedtime. 90 tablet 3  . Multiple Vitamin (MULTIVITAMIN PO) Take by mouth.    Marland Kitchen olopatadine (PATANOL) 0.1 % ophthalmic solution Place 1 drop into both eyes 2 (two) times daily as needed for allergies (itchy/watery  eyes). 5 mL 5  . VITAMIN D PO Take 50 mcg by mouth daily.    Marland Kitchen ALPRAZolam (XANAX) 0.25 MG tablet Take 1 tablet (0.25 mg total) by mouth at bedtime as needed for anxiety. (Patient not taking: Reported on 09/11/2019) 30 tablet 0  . amoxicillin-clavulanate (AUGMENTIN) 875-125 MG tablet Take 1 tablet by mouth 2 (two) times daily. (Patient not taking: Reported on 09/11/2019) 20 tablet 0  . azelastine (OPTIVAR) 0.05 % ophthalmic solution Place 1 drop into both eyes 2 (two) times daily. (Patient not taking: Reported on 09/11/2019) 6 mL 12  . etonogestrel-ethinyl estradiol (NUVARING) 0.12-0.015 MG/24HR vaginal ring     . fluticasone (FLONASE) 50 MCG/ACT nasal spray Place 2 sprays into both nostrils daily. (Patient not taking: Reported on 09/11/2019) 16 g 6  . Fluticasone-Salmeterol (ADVAIR DISKUS) 250-50 MCG/DOSE AEPB Inhale 1 puff into the lungs 2 (two) times daily. (Patient not taking: Reported on 06/12/2019) 60 each prn  . ipratropium-albuterol (DUONEB) 0.5-2.5 (3) MG/3ML SOLN Take 3 mLs by nebulization every 6 (six) hours as needed. (Patient not taking: Reported on 09/11/2019) 360 mL 0  . triamcinolone ointment (KENALOG) 0.1 % Apply 1 application topically 2 (two) times daily. 30 g 2   No current facility-administered medications for this visit.   Allergies: Allergies  Allergen Reactions  . Almond (Diagnostic) Anaphylaxis  .  Other Anaphylaxis    PT IS ALLERGIC PITTED FRUITS  . Pistachio Nut (Diagnostic) Anaphylaxis  . Latex Rash   I reviewed her past medical history, social history, family history, and environmental history and no significant changes have been reported from her previous visit.  Review of Systems  Constitutional: Negative for appetite change, chills, fever and unexpected weight change.  HENT: Positive for congestion, rhinorrhea and sneezing.   Eyes: Positive for itching.  Respiratory: Negative for cough, chest tightness, shortness of breath and wheezing.   Cardiovascular: Negative  for chest pain.  Gastrointestinal: Negative for abdominal pain.  Genitourinary: Negative for difficulty urinating.  Skin: Positive for rash.  Allergic/Immunologic: Positive for environmental allergies and food allergies.  Neurological: Negative for headaches.   Objective: BP 114/76 (BP Location: Left Arm, Patient Position: Sitting, Cuff Size: Normal)   Pulse 82   Temp 98.1 F (36.7 C) (Temporal)   Resp 18   Ht 5\' 4"  (1.626 m)   Wt 231 lb (104.8 kg)   SpO2 97%   BMI 39.65 kg/m  Body mass index is 39.65 kg/m. Physical Exam  Constitutional: She is oriented to person, place, and time. She appears well-developed and well-nourished.  HENT:  Head: Normocephalic and atraumatic.  Right Ear: External ear normal.  Left Ear: External ear normal.  Nose: Mucosal edema present.  Mouth/Throat: Oropharynx is clear and moist.  Eyes: Conjunctivae and EOM are normal.  Cardiovascular: Normal rate, regular rhythm and normal heart sounds. Exam reveals no gallop and no friction rub.  No murmur heard. Pulmonary/Chest: Effort normal and breath sounds normal. She has no wheezes. She has no rales.  Abdominal: Soft.  Musculoskeletal:     Cervical back: Neck supple.  Neurological: She is alert and oriented to person, place, and time.  Skin: Skin is warm. Rash noted.  Dry, cracked erythematous patches on the palm b/l.  Psychiatric: She has a normal mood and affect. Her behavior is normal.  Nursing note and vitals reviewed.  Previous notes and tests were reviewed. The plan was reviewed with the patient/family, and all questions/concerned were addressed.  It was my pleasure to see Kristina Stewart today and participate in her care. Please feel free to contact me with any questions or concerns.  Sincerely,  Marcelino Duster, DO Allergy & Immunology  [facility] office: [phone] [facility] office: [phone] [place] office: [phone]

## 2019-09-11 NOTE — Assessment & Plan Note (Signed)
Past history - Perennial rhinoconjunctivitis symptoms for the last 10+ years with worsening during change of season. 2020 skin testing showed: Positive to grass, weed, ragweed, trees, mold. Started AIT on 07/06/2019 (MOLD, G-W-RW-T). Interim history - some localized reaction with the injections especially to pollen injection.  Continue environmental control measures.   Continue Singulair (montelukast) 10mg  daily at night.  May use over the counter antihistamines such as Zyrtec (cetirizine), Claritin (loratadine), Allegra (fexofenadine), or Xyzal (levocetirizine) daily as needed.  May take twice a day for breakthrough symptoms.  May use Patanol 0.1% eye drop 1 drop in each eye twice daily as needed for itchy/watery eyes.   May use dymista 1 spray twice a day.   Continue allergy injections - change schedule from B to A.

## 2019-09-11 NOTE — Patient Instructions (Addendum)
Mild intermittent asthma without complication  May use albuterol rescue inhaler 2 puffs or nebulizer every 4 to 6 hours as needed for shortness of breath, chest tightness, coughing, and wheezing. May use albuterol rescue inhaler 2 puffs 5 to 15 minutes prior to strenuous physical activities. Monitor frequency of use.   During upper respiratory infections/asthma flares: Start Advair Diskus 250 1 puff twice a day and rinse mouth afterwards for 1-2 week.  Allergic conjunctivitis Past skin testing showed: Positive to grass, weed, ragweed, trees, mold.   Continue environmental control measures.   Continue Singulair (montelukast) 10mg  daily at night.  May use over the counter antihistamines such as Zyrtec (cetirizine), Claritin (loratadine), Allegra (fexofenadine), or Xyzal (levocetirizine) daily as needed.  May take twice a day for breakthrough symptoms.  May use Patanol 0.1% eye drop 1 drop in each eye twice daily as needed for itchy/watery eyes.   May use dymista 1 spray twice a day.   Continue allergy injections.   Pollen-food allergy  Avoid fresh fruits and almonds for now.  For mild symptoms you can take over the counter antihistamines such as Benadryl and monitor symptoms closely. If symptoms worsen or if you have severe symptoms including breathing issues, throat closure, significant swelling, whole body hives, severe diarrhea and vomiting, lightheadedness then inject epinephrine and seek immediate medical care afterwards.  Dyshidrotic eczema  Use triamcinolone ointment twice a day for moderate eczema flares.   Use Eucrisa twice a day for mild flares.   Continue proper skin care.  Read about Dupixent injections for eczema.   Follow up in 3 months or sooner if needed.

## 2019-09-11 NOTE — Assessment & Plan Note (Addendum)
Past history - Dyshidrotic eczema on the palms bilaterally.  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   Interim history - Kristina Stewart worked initially but now having flare.   Use triamcinolone 0.1% ointment twice a day for moderate eczema flares.   Use Eucrisa twice a day for mild flares.   Continue proper skin care.  Discussed Dupixent injections for eczema - handout given.

## 2019-09-11 NOTE — Assessment & Plan Note (Signed)
Past history - Certain raw fruits and almonds cause perioral pruritus.  Tolerates processed and cooked forms with no issues.  Patient questionably had a reaction to pistachios but it was cross contaminated almonds in the past.  She tolerates other tree nuts including cashews, walnuts, pecans and hazelnuts with no issues. 2020 skin testing was borderline positive to almonds and negative pistachios. Interim history - tolerated pistachios and nectarines at home with no issues.   Avoid fresh fruits and almonds for now.  For mild symptoms you can take over the counter antihistamines such as Benadryl and monitor symptoms closely. If symptoms worsen or if you have severe symptoms including breathing issues, throat closure, significant swelling, whole body hives, severe diarrhea and vomiting, lightheadedness then inject epinephrine and seek immediate medical care afterwards.

## 2019-09-11 NOTE — Assessment & Plan Note (Signed)
Past history - Diagnosed with asthma in middle school and was doing well up until a few years ago.  Main triggers include allergies, weather change, infection, strong scents and perfumes.  During these flares she takes Advair 250 1 puff twice a day, Singulair 10 mg daily and albuterol as needed with good benefit. 2020 spirometry was normal. Interim history - No issues.   May use albuterol rescue inhaler 2 puffs or nebulizer every 4 to 6 hours as needed for shortness of breath, chest tightness, coughing, and wheezing. May use albuterol rescue inhaler 2 puffs 5 to 15 minutes prior to strenuous physical activities. Monitor frequency of use.   During upper respiratory infections/asthma flares: Start Advair Diskus 250 1 puff twice a day and rinse mouth afterwards for 1-2 week.

## 2019-09-20 ENCOUNTER — Ambulatory Visit (INDEPENDENT_AMBULATORY_CARE_PROVIDER_SITE_OTHER): Payer: No Typology Code available for payment source

## 2019-09-20 DIAGNOSIS — J309 Allergic rhinitis, unspecified: Secondary | ICD-10-CM

## 2019-09-20 MED FILL — AZELASTINE-FLUTICASONE 137-: 137-50 | 30 days supply | Qty: 23 | Fill #0

## 2019-10-06 ENCOUNTER — Ambulatory Visit (INDEPENDENT_AMBULATORY_CARE_PROVIDER_SITE_OTHER): Payer: No Typology Code available for payment source

## 2019-10-06 DIAGNOSIS — J309 Allergic rhinitis, unspecified: Secondary | ICD-10-CM | POA: Diagnosis not present

## 2019-10-13 ENCOUNTER — Ambulatory Visit (INDEPENDENT_AMBULATORY_CARE_PROVIDER_SITE_OTHER): Payer: No Typology Code available for payment source

## 2019-10-13 DIAGNOSIS — J309 Allergic rhinitis, unspecified: Secondary | ICD-10-CM | POA: Diagnosis not present

## 2019-10-19 ENCOUNTER — Ambulatory Visit (INDEPENDENT_AMBULATORY_CARE_PROVIDER_SITE_OTHER): Payer: No Typology Code available for payment source

## 2019-10-19 DIAGNOSIS — J309 Allergic rhinitis, unspecified: Secondary | ICD-10-CM

## 2019-11-02 ENCOUNTER — Ambulatory Visit (INDEPENDENT_AMBULATORY_CARE_PROVIDER_SITE_OTHER): Payer: No Typology Code available for payment source

## 2019-11-02 DIAGNOSIS — J309 Allergic rhinitis, unspecified: Secondary | ICD-10-CM | POA: Diagnosis not present

## 2019-11-09 ENCOUNTER — Other Ambulatory Visit: Payer: Self-pay

## 2019-11-09 ENCOUNTER — Telehealth: Payer: Self-pay

## 2019-11-09 ENCOUNTER — Ambulatory Visit (INDEPENDENT_AMBULATORY_CARE_PROVIDER_SITE_OTHER): Payer: No Typology Code available for payment source | Admitting: Allergy

## 2019-11-09 ENCOUNTER — Encounter: Payer: Self-pay | Admitting: Allergy

## 2019-11-09 DIAGNOSIS — T781XXD Other adverse food reactions, not elsewhere classified, subsequent encounter: Secondary | ICD-10-CM | POA: Diagnosis not present

## 2019-11-09 DIAGNOSIS — Z3490 Encounter for supervision of normal pregnancy, unspecified, unspecified trimester: Secondary | ICD-10-CM | POA: Insufficient documentation

## 2019-11-09 DIAGNOSIS — J302 Other seasonal allergic rhinitis: Secondary | ICD-10-CM | POA: Diagnosis not present

## 2019-11-09 DIAGNOSIS — L301 Dyshidrosis [pompholyx]: Secondary | ICD-10-CM

## 2019-11-09 DIAGNOSIS — J4520 Mild intermittent asthma, uncomplicated: Secondary | ICD-10-CM

## 2019-11-09 DIAGNOSIS — H1010 Acute atopic conjunctivitis, unspecified eye: Secondary | ICD-10-CM

## 2019-11-09 DIAGNOSIS — J3089 Other allergic rhinitis: Secondary | ICD-10-CM

## 2019-11-09 MED ORDER — BUDESONIDE-FORMOTEROL FUMARATE 80-4.5 MCG/ACT IN AERO: 2.0000 | INHALATION_SPRAY | Freq: Two times a day (BID) | RESPIRATORY_TRACT | 5 refills | Status: DC | PRN

## 2019-11-09 NOTE — Telephone Encounter (Signed)
Please call patient back.  I recommend that she schedules a tele visit to further discuss. I have openings later this afternoon in the [facility] office.   Thank you.

## 2019-11-09 NOTE — Assessment & Plan Note (Signed)
Past history - Perennial rhinoconjunctivitis symptoms for the last 10+ years with worsening during change of season. 2020 skin testing showed: Positive to grass, weed, ragweed, trees, mold. Started AIT on 07/06/2019 (MOLD, G-W-RW-T). Interim history - last injection was blue vial 0.5cc and found out pregnant now. Noticed some benefit this spring already.  Discussed at length what to do with the allergy injections - can't increase dose during pregnancy due to risk for anaphylaxis. Okay to continue with current last dose with blue vial 0.5cc until completion of the vial and then resume injections once pregnancy is over OR can stop the injections during pregnancy as she is only on her first vial and resume injections once pregnancy is over.   Okay to continue with dymista 1 spray twice a day - safe in pregnancy.   Okay to continue Patanol 0.1% eye drop 1 drop in each eye twice daily as needed for itchy/watery eyes - safe in pregnancy.   May use Allegra (fexofenadine) daily as needed - safe in pregnancy.   Continue environmental control measures.   Stop Singulair (montelukast) 10mg  daily at night for now - relatively safe in pregnancy.   If you notice worsening asthma or allergies then may restart.

## 2019-11-09 NOTE — Assessment & Plan Note (Signed)
Past history - Certain raw fruits and almonds cause perioral pruritus.  Tolerates processed and cooked forms with no issues.  Patient questionably had a reaction to pistachios but it was cross contaminated almonds in the past.  She tolerates other tree nuts including cashews, walnuts, pecans and hazelnuts with no issues. 2020 skin testing was borderline positive to almonds and negative pistachios. Okay with pistachios and nectarines.   Avoid fresh fruits and almonds for now.  For mild symptoms you can take over the counter antihistamines such as Benadryl and monitor symptoms closely. If symptoms worsen or if you have severe symptoms including breathing issues, throat closure, significant swelling, whole body hives, severe diarrhea and vomiting, lightheadedness then inject epinephrine and seek immediate medical care afterwards.

## 2019-11-09 NOTE — Assessment & Plan Note (Signed)
Past history - Dyshidrotic eczema on the palms bilaterally.  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   Interim history - stable.   Use triamcinolone 0.1% ointment twice a day for moderate eczema flares.   Use Eucrisa twice a day for mild flares.   Continue proper skin care.

## 2019-11-09 NOTE — Telephone Encounter (Signed)
Patient has had a positive pregnancy test. She would like to know how to proceed with her medications and allergen immunotherapy.

## 2019-11-09 NOTE — Telephone Encounter (Signed)
Patient has been placed on the schedule for today at 4 and will call back if she may need to change.

## 2019-11-09 NOTE — Progress Notes (Signed)
RE: Kristina Stewart MRN: [id number] DOB: [birth year] Date of Telemedicine Visit: 11/09/2019  Referring provider: Margaree Mackintosh, MD Primary care provider: Margaree Mackintosh, MD  Chief Complaint: follow up, questions about pregnancy and meds  Telemedicine Follow Up Visit via Telephone: I connected with Kristina Stewart for a follow up on 11/09/19 by telephone and verified that I am speaking with the correct person using two identifiers.   I discussed the limitations, risks, security and privacy concerns of performing an evaluation and management service by telephone and the availability of in person appointments. I also discussed with the patient that there may be a patient responsible charge related to this service. The patient expressed understanding and agreed to proceed.  Patient is at home/work.  Provider is at the office.  Visit start time: 4:18PM Visit end time: 4:41PM Insurance consent/check in by: front desk Medical consent and medical assistant/nurse: Vonzell Schlatter.  History of Present Illness: She is a 34 y.o. female, who is being followed for allergic rhinoconjunctivitis, asthma, oral allergy syndrome and dyshidrotic eczema. Her previous allergy office visit was on 09/11/2019 with Dr. Selena Batten. Today is a new complaint visit of discussing medications with pregnancy.  Patient is [redacted] weeks pregnant now and wants to know what medications are okay to take with pregnancy and what to do regarding her allergy injections.   Seasonal and perennial allergic rhinoconjunctivitis Currently on weekly allergy injections and doing slightly better this spring. No reactions.  Allegra and Xyzal works the best for her. Using eye drops and nasal sprays as needed.   Mild intermittent asthma without complication Asthma flared about 2 weeks ago due to pollen. Patient was using Advair and albuterol as needed with good benefit.   Denies any SOB, coughing, wheezing, chest tightness, nocturnal awakenings,  ER/urgent care visits or prednisone use since the last visit.  Dyshidrotic eczema Using triamcinolone as needed. Hands are doing better.   Assessment and Plan: Special is a 34 y.o. female with: Mild intermittent asthma without complication Past history - Diagnosed with asthma in middle school and was doing well up until a few years ago.  Main triggers include allergies, weather change, infection, strong scents and perfumes.  During these flares she takes Advair 250 1 puff twice a day, Singulair 10 mg daily and albuterol as needed with good benefit. 2020 spirometry was normal. Interim history - Flared 2 weeks ago and used Advair and albuterol with good benefit. Patient is pregnant now.   May use albuterol rescue inhaler 2 puffs or nebulizer every 4 to 6 hours as needed for shortness of breath, chest tightness, coughing, and wheezing. May use albuterol rescue inhaler 2 puffs 5 to 15 minutes prior to strenuous physical activities. Monitor frequency of use.   During upper respiratory infections/asthma flares: will switch from Advair to Symbicort as Symbicort has safer pregnancy profile.  Start Symbicort 80 2 puffs twice a day and rinse mouth afterwards for 1-2 week.  Seasonal and perennial allergic rhinoconjunctivitis Past history - Perennial rhinoconjunctivitis symptoms for the last 10+ years with worsening during change of season. 2020 skin testing showed: Positive to grass, weed, ragweed, trees, mold. Started AIT on 07/06/2019 (MOLD, G-W-RW-T). Interim history - last injection was blue vial 0.5cc and found out pregnant now. Noticed some benefit this spring already.  Discussed at length what to do with the allergy injections - can't increase dose during pregnancy due to risk for anaphylaxis. Okay to continue with current last dose with blue vial 0.5cc until completion  of the vial and then resume injections once pregnancy is over OR can stop the injections during pregnancy as she is only on her  first vial and resume injections once pregnancy is over.   Okay to continue with dymista 1 spray twice a day - safe in pregnancy.   Okay to continue Patanol 0.1% eye drop 1 drop in each eye twice daily as needed for itchy/watery eyes - safe in pregnancy.   May use Allegra (fexofenadine) daily as needed - safe in pregnancy.   Continue environmental control measures.   Stop Singulair (montelukast) 10mg  daily at night for now - relatively safe in pregnancy.   If you notice worsening asthma or allergies then may restart.   Pollen-food allergy Past history - Certain raw fruits and almonds cause perioral pruritus.  Tolerates processed and cooked forms with no issues.  Patient questionably had a reaction to pistachios but it was cross contaminated almonds in the past.  She tolerates other tree nuts including cashews, walnuts, pecans and hazelnuts with no issues. 2020 skin testing was borderline positive to almonds and negative pistachios. Okay with pistachios and nectarines.   Avoid fresh fruits and almonds for now.  For mild symptoms you can take over the counter antihistamines such as Benadryl and monitor symptoms closely. If symptoms worsen or if you have severe symptoms including breathing issues, throat closure, significant swelling, whole body hives, severe diarrhea and vomiting, lightheadedness then inject epinephrine and seek immediate medical care afterwards.  Dyshidrotic eczema Past history - Dyshidrotic eczema on the palms bilaterally.  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   Interim history - stable.   Use triamcinolone 0.1% ointment twice a day for moderate eczema flares.   Use Eucrisa twice a day for mild flares.   Continue proper skin care.  Return in about 4 months (around 03/11/2020).  Meds ordered this encounter  Medications  . budesonide-formoterol (SYMBICORT) 80-4.5 MCG/ACT inhaler    Sig: Inhale 2 puffs into the lungs 2  (two) times daily as needed. Rinse mouth after each use.    Dispense:  1 Inhaler    Refill:  5   Diagnostics: None.  Medication List:  Current Outpatient Medications  Medication Sig Dispense Refill  . albuterol (VENTOLIN HFA) 108 (90 Base) MCG/ACT inhaler Inhale 2 puffs into the lungs every 6 (six) hours as needed for wheezing or shortness of breath. 6.7 g prn  . Azelastine-Fluticasone 137-50 MCG/ACT SUSP Place 1 spray into the nose 2 (two) times daily. 23 g 5  . Crisaborole (EUCRISA) 2 % OINT Apply 1 application topically 2 (two) times daily as needed. 100 g 5  . cyclobenzaprine (FLEXERIL) 10 MG tablet Take 1 tablet (10 mg total) by mouth 3 (three) times daily as needed for muscle spasms. 90 tablet 0  . EPINEPHrine 0.3 mg/0.3 mL IJ SOAJ injection SMARTSIG:0.3 Milliliter(s) IM Once PRN    . levocetirizine (XYZAL ALLERGY 24HR) 5 MG tablet Take 5 mg by mouth every evening.    . montelukast (SINGULAIR) 10 MG tablet Take 1 tablet (10 mg total) by mouth at bedtime. 90 tablet 3  . Multiple Vitamin (MULTIVITAMIN PO) Take by mouth.    03/13/2020 olopatadine (PATANOL) 0.1 % ophthalmic solution Place 1 drop into both eyes 2 (two) times daily as needed for allergies (itchy/watery eyes). 5 mL 5  . triamcinolone ointment (KENALOG) 0.1 % Apply 1 application topically 2 (two) times daily. 30 g 2  . VITAMIN D PO  Take 50 mcg by mouth daily.    . budesonide-formoterol (SYMBICORT) 80-4.5 MCG/ACT inhaler Inhale 2 puffs into the lungs 2 (two) times daily as needed. Rinse mouth after each use. 1 Inhaler 5  . fluticasone (FLONASE) 50 MCG/ACT nasal spray Place 2 sprays into both nostrils daily. (Patient not taking: Reported on 09/11/2019) 16 g 6   No current facility-administered medications for this visit.   Allergies: Allergies  Allergen Reactions  . Almond (Diagnostic) Anaphylaxis  . Other Anaphylaxis    PT IS ALLERGIC PITTED FRUITS  . Pistachio Nut (Diagnostic) Anaphylaxis  . Latex Rash   I reviewed her past  medical history, social history, family history, and environmental history and no significant changes have been reported from her previous visit.  Review of Systems  Constitutional: Negative for appetite change, chills, fever and unexpected weight change.  HENT: Positive for congestion, rhinorrhea and sneezing.   Eyes: Positive for itching.  Respiratory: Negative for cough, chest tightness, shortness of breath and wheezing.   Cardiovascular: Negative for chest pain.  Gastrointestinal: Negative for abdominal pain.  Genitourinary: Negative for difficulty urinating.  Skin: Positive for rash.  Allergic/Immunologic: Positive for environmental allergies and food allergies.  Neurological: Negative for headaches.   Objective: Physical Exam Not obtained as encounter was done via telephone.   Previous notes and tests were reviewed.  I discussed the assessment and treatment plan with the patient. The patient was provided an opportunity to ask questions and all were answered. The patient agreed with the plan and demonstrated an understanding of the instructions. After visit summary/patient instructions available via mychart.   The patient was advised to call back or seek an in-person evaluation if the symptoms worsen or if the condition fails to improve as anticipated.  I provided 23 minutes of non-face-to-face time during this encounter.  It was my pleasure to participate in Kristina Stewart's care today. Please feel free to contact me with any questions or concerns.   Sincerely,  Rexene Alberts, DO Allergy & Immunology  [facility] office: [phone] [facility] office: [place] office: [phone]

## 2019-11-09 NOTE — Assessment & Plan Note (Signed)
Past history - Diagnosed with asthma in middle school and was doing well up until a few years ago.  Main triggers include allergies, weather change, infection, strong scents and perfumes.  During these flares she takes Advair 250 1 puff twice a day, Singulair 10 mg daily and albuterol as needed with good benefit. 2020 spirometry was normal. Interim history - Flared 2 weeks ago and used Advair and albuterol with good benefit. Patient is pregnant now.   May use albuterol rescue inhaler 2 puffs or nebulizer every 4 to 6 hours as needed for shortness of breath, chest tightness, coughing, and wheezing. May use albuterol rescue inhaler 2 puffs 5 to 15 minutes prior to strenuous physical activities. Monitor frequency of use.   During upper respiratory infections/asthma flares: will switch from Advair to Symbicort as Symbicort has safer pregnancy profile.  Start Symbicort 80 2 puffs twice a day and rinse mouth afterwards for 1-2 week.

## 2019-11-09 NOTE — Patient Instructions (Addendum)
Mild intermittent asthma   May use albuterol rescue inhaler 2 puffs or nebulizer every 4 to 6 hours as needed for shortness of breath, chest tightness, coughing, and wheezing. May use albuterol rescue inhaler 2 puffs 5 to 15 minutes prior to strenuous physical activities. Monitor frequency of use.   During upper respiratory infections/asthma flares: Start Symbicort 80 2 puffs twice a day and rinse mouth afterwards for 1-2 week.  Allergic conjunctivitis Past skin testing showed: Positive to grass, weed, ragweed, trees, mold.   Discussed at length what to do with the allergy injections - can't increase dose during pregnancy due to risk for anaphylaxis. Okay to continue with current last dose with blue vial 0.5cc until completion of the vial and then resume injections once pregnancy is over   OR can stop the injections during pregnancy as she is only on her first vial and resume injections once pregnancy is over.   Okay to continue with dymista 1 spray twice a day - safe in pregnancy.   Okay to continue Patanol 0.1% eye drop 1 drop in each eye twice daily as needed for itchy/watery eyes - safe in pregnancy.   May use Allegra (fexofenadine) daily as needed - safe in pregnancy.   Continue environmental control measures.   Stop Singulair (montelukast) 10mg  daily at night for now - relatively safe in pregnancy.   If you notice worsening asthma or allergies then may restart.   Pollen-food allergy  Avoid fresh fruits and almonds for now.  For mild symptoms you can take over the counter antihistamines such as Benadryl and monitor symptoms closely. If symptoms worsen or if you have severe symptoms including breathing issues, throat closure, significant swelling, whole body hives, severe diarrhea and vomiting, lightheadedness then inject epinephrine and seek immediate medical care afterwards.  Dyshidrotic eczema  Use triamcinolone ointment twice a day for moderate eczema flares.   Use  Eucrisa twice a day for mild flares.   Continue proper skin care.  Follow up in 4 months or sooner if needed.

## 2019-11-20 MED FILL — AZELASTINE-FLUTICASONE 137-: 137-50 | 30 days supply | Qty: 23 | Fill #0

## 2019-11-29 MED FILL — PROMETHAZINE 12.5 MG TABLET: 12.5 | 8 days supply | Qty: 30 | Fill #0

## 2019-12-12 LAB — OB RESULTS CONSOLE HEPATITIS B SURFACE ANTIGEN: Hepatitis B Surface Ag: NEGATIVE

## 2019-12-12 LAB — OB RESULTS CONSOLE RUBELLA ANTIBODY, IGM: Rubella: IMMUNE

## 2019-12-12 LAB — OB RESULTS CONSOLE HIV ANTIBODY (ROUTINE TESTING): HIV: NONREACTIVE

## 2019-12-13 ENCOUNTER — Ambulatory Visit: Payer: No Typology Code available for payment source | Admitting: Allergy

## 2019-12-14 ENCOUNTER — Ambulatory Visit: Payer: No Typology Code available for payment source | Admitting: Allergy

## 2019-12-14 ENCOUNTER — Telehealth: Payer: Self-pay | Admitting: Internal Medicine

## 2019-12-14 NOTE — Telephone Encounter (Signed)
Merlyn Bollen [phone]  Ricki called to let us know she is pregnant and will be going to [facility] for her OB-Gyn care. She see's Dr Belva Agee. She needed to notify us for her [employer] purposes.

## 2019-12-21 LAB — OB RESULTS CONSOLE GC/CHLAMYDIA
Chlamydia: NEGATIVE
Gonorrhea: NEGATIVE

## 2019-12-21 MED FILL — DOXYLAMINE-PYRIDOXINE 10-10: 10-10 | 15 days supply | Qty: 60 | Fill #0

## 2020-01-06 MED FILL — DOXYLAMINE-PYRIDOXINE 10-10: 10-10 | 15 days supply | Qty: 60 | Fill #1

## 2020-01-12 ENCOUNTER — Telehealth: Payer: No Typology Code available for payment source | Admitting: Emergency Medicine

## 2020-01-12 DIAGNOSIS — J019 Acute sinusitis, unspecified: Secondary | ICD-10-CM | POA: Diagnosis not present

## 2020-01-12 MED ORDER — AMOXICILLIN 500 MG PO CAPS: 500.0000 mg | ORAL_CAPSULE | Freq: Three times a day (TID) | ORAL | 0 refills | Status: AC

## 2020-01-12 MED FILL — AMOXICILLIN 500 MG CAPSULE: 500 | 7 days supply | Qty: 21 | Fill #0

## 2020-01-12 NOTE — Progress Notes (Signed)
Time spent: 10 min   We are sorry that you are not feeling well.  Here is how we plan to help!  Based on what you have shared with me it looks like you have sinusitis.  Sinusitis is inflammation and infection in the sinus cavities of the head.  Based on your presentation I believe you most likely have Acute Bacterial Sinusitis.  This is an infection caused by bacteria and is treated with antibiotics. I have prescribed AMOXICILLIN.  Please schedule an appointment in person with primary care provider if symptoms are not improving in 72 hours.  Continue all your prescribed medicines by allergist and primary care doctor to help with other symptoms.  Safe over the counter medicines that can be used during pregnancy are allegra, benadryl, acetaminophen (tylenol).  Continue all your inhalers as prescribed.   Sinus infections are not as easily transmitted as other respiratory infection, however we still recommend that you avoid close contact with loved ones, especially the very young and elderly.  Remember to wash your hands thoroughly throughout the day as this is the number one way to prevent the spread of infection!  Home Care:  Only take medications as instructed by your medical team.  Complete the entire course of an antibiotic.  Do not take these medications with alcohol.  A steam or ultrasonic humidifier can help congestion.  You can place a towel over your head and breathe in the steam from hot water coming from a faucet.  Avoid close contacts especially the very young and the elderly.  Cover your mouth when you cough or sneeze.  Always remember to wash your hands.  Get Help Right Away If:  You develop worsening fever or sinus pain.  You develop a severe head ache or visual changes.  Your symptoms persist after you have completed your treatment plan.  Make sure you  Understand these instructions.  Will watch your condition.  Will get help right away if you are not doing well  or get worse.  Your e-visit answers were reviewed by a board certified advanced clinical practitioner to complete your personal care plan.  Depending on the condition, your plan could have included both over the counter or prescription medications.  If there is a problem please reply  once you have received a response from your provider.  Your safety is important to [place].  If you have drug allergies check your prescription carefully.    You can use MyChart to ask questions about today's visit, request a non-urgent call back, or ask for a work or school excuse for 24 hours related to this e-Visit. If it has been greater than 24 hours you will need to follow up with your provider, or enter a new e-Visit to address those concerns.  You will get an e-mail in the next two days asking about your experience.  I hope that your e-visit has been valuable and will speed your recovery. Thank you for using e-visits.

## 2020-01-16 MED FILL — PROMETHAZINE 12.5 MG TABLET: 12.5 | 7 days supply | Qty: 30 | Fill #0

## 2020-01-18 ENCOUNTER — Telehealth: Payer: No Typology Code available for payment source | Admitting: Family

## 2020-01-18 DIAGNOSIS — L700 Acne vulgaris: Secondary | ICD-10-CM | POA: Diagnosis not present

## 2020-01-18 MED ORDER — CLINDAMYCIN PHOSPHATE 1 % EX GEL: Freq: Two times a day (BID) | CUTANEOUS | 0 refills | Status: DC

## 2020-01-18 MED ORDER — CLINDAMYCIN PHOS-BENZOYL PEROX 1-5 % EX GEL: Freq: Two times a day (BID) | CUTANEOUS | 0 refills | Status: DC

## 2020-01-18 MED FILL — CLINDAMYCIN PHOSPHATE 1 % G: 1 | 10 days supply | Qty: 30 | Fill #0

## 2020-01-18 NOTE — Addendum Note (Signed)
Addended by: Worthy Rancher B on: 01/18/2020 10:53 AM   Modules accepted: Orders

## 2020-01-18 NOTE — Progress Notes (Signed)
Greater than 5 minutes, yet less than 10 minutes of time have been spent researching, coordinating, and implementing care for this patient today.  Thank you for the details you included in the comment boxes. Those details are very helpful in determining the best course of treatment for you and help us to provide the best care.  

## 2020-01-18 NOTE — Progress Notes (Signed)
We are sorry that you are experiencing this issue.  Here is how we plan to help!  Based on what you shared with me it looks like you have uncomplicated acne.  Acne is a disorder of the hair follicles and oil glands (sebaceous glands). The sebaceous glands secrete oils to keep the skin moist.  When the glands get clogged, it can lead to pimples or cysts.  These cysts may become infected and leave scars. Acne is very common and normally occurs at puberty.  Acne is also inherited.  Your personal care plan consists of the following recommendations:  I recommend that you use a daily cleanser  You may try a topical exfoliator and salicylic acid scrub.  These scrubs have coarse particles that clear your pores but may also irritate your skin.  I have prescribed a topical gel with an antibiotic:  Clindamycin-benzoyl peroxide gel.  This gel should be applied to the affected areas twice a day.  Be sure to read the package insert to understand potential side effects.  If excessive dryness or peeling occurs, reduce dose frequency or concentration of the topical scrubs.  If excessive stinging or burning occurs, remove the topical gel with mild soap and water and resume at a lower dose the next day.  Remember oral antibiotics and topical acne treatments may increase your sensitivity to the sun!  HOME CARE:  Do not squeeze pimples because that can often lead to infections, worse acne, and scars.  Use a moisturizer that contains retinoid or fruit acids that may inhibit the development of new acne lesions.  Although there is not a clear link that foods can cause acne, doctors do believe that too many sweets predispose you to skin problems.  GET HELP RIGHT AWAY IF:  If your acne gets worse or is not better within 10 days.  If you become depressed.  If you become pregnant, discontinue medications and call your OB/GYN.  MAKE SURE YOU:  Understand these instructions.  Will watch your condition.  Will  get help right away if you are not doing well or get worse.   Your e-visit answers were reviewed by a board certified advanced clinical practitioner to complete your personal care plan.  Depending upon the condition, your plan could have included both over the counter or prescription medications.  Please review your pharmacy choice.  If there is a problem, you may contact your provider through MyChart messaging and have the prescription routed to another pharmacy.  Your safety is important to us.  If you have drug allergies check your prescription carefully.  For the next 24 hours you can use MyChart to ask questions about today's visit, request a non-urgent call back, or ask for a work or school excuse from your e-visit provider.  You will get an email in the next two days asking about your experience. I hope that your e-visit has been valuable and will speed your recovery.   

## 2020-02-06 MED FILL — AZELASTINE-FLUTICASONE 137-: 137-50 | 30 days supply | Qty: 23 | Fill #0

## 2020-02-08 MED FILL — DOXYLAMINE-PYRIDOXINE 10-10: 10-10 | 30 days supply | Qty: 60 | Fill #0

## 2020-02-09 ENCOUNTER — Encounter (HOSPITAL_COMMUNITY): Payer: Self-pay | Admitting: Obstetrics and Gynecology

## 2020-02-09 ENCOUNTER — Inpatient Hospital Stay (HOSPITAL_COMMUNITY)
Admission: AD | Admit: 2020-02-09 | Discharge: 2020-02-09 | Disposition: A | Discharge status: Home / Self Care | Payer: No Typology Code available for payment source | Attending: Obstetrics and Gynecology | Admitting: Obstetrics and Gynecology

## 2020-02-09 ENCOUNTER — Other Ambulatory Visit: Payer: Self-pay

## 2020-02-09 DIAGNOSIS — K589 Irritable bowel syndrome without diarrhea: Secondary | ICD-10-CM | POA: Diagnosis not present

## 2020-02-09 DIAGNOSIS — N898 Other specified noninflammatory disorders of vagina: Secondary | ICD-10-CM

## 2020-02-09 DIAGNOSIS — J45909 Unspecified asthma, uncomplicated: Secondary | ICD-10-CM | POA: Insufficient documentation

## 2020-02-09 DIAGNOSIS — Z79899 Other long term (current) drug therapy: Secondary | ICD-10-CM | POA: Diagnosis not present

## 2020-02-09 DIAGNOSIS — O99212 Obesity complicating pregnancy, second trimester: Secondary | ICD-10-CM | POA: Diagnosis not present

## 2020-02-09 DIAGNOSIS — R102 Pelvic and perineal pain: Secondary | ICD-10-CM | POA: Diagnosis not present

## 2020-02-09 DIAGNOSIS — Z3A17 17 weeks gestation of pregnancy: Secondary | ICD-10-CM | POA: Diagnosis not present

## 2020-02-09 DIAGNOSIS — O99891 Other specified diseases and conditions complicating pregnancy: Secondary | ICD-10-CM | POA: Diagnosis not present

## 2020-02-09 DIAGNOSIS — Z7951 Long term (current) use of inhaled steroids: Secondary | ICD-10-CM | POA: Diagnosis not present

## 2020-02-09 DIAGNOSIS — O26892 Other specified pregnancy related conditions, second trimester: Secondary | ICD-10-CM | POA: Diagnosis present

## 2020-02-09 DIAGNOSIS — O99612 Diseases of the digestive system complicating pregnancy, second trimester: Secondary | ICD-10-CM | POA: Diagnosis not present

## 2020-02-09 DIAGNOSIS — O99512 Diseases of the respiratory system complicating pregnancy, second trimester: Secondary | ICD-10-CM | POA: Diagnosis not present

## 2020-02-09 LAB — URINALYSIS, ROUTINE W REFLEX MICROSCOPIC
Bilirubin Urine: NEGATIVE
Glucose, UA: NEGATIVE mg/dL
Hgb urine dipstick: NEGATIVE
Ketones, ur: NEGATIVE mg/dL
Nitrite: NEGATIVE
Protein, ur: NEGATIVE mg/dL
Specific Gravity, Urine: 1.009 (ref 1.005–1.030)
pH: 7 (ref 5.0–8.0)

## 2020-02-09 LAB — WET PREP, GENITAL
Clue Cells Wet Prep HPF POC: NONE SEEN
Sperm: NONE SEEN
Trich, Wet Prep: NONE SEEN
Yeast Wet Prep HPF POC: NONE SEEN

## 2020-02-09 NOTE — MAU Provider Note (Signed)
History     CSN: [id number]  Arrival date and time: 02/09/20 1555   First Provider Initiated Contact with Patient 02/09/20 1833      Chief Complaint  Patient presents with  . Abdominal Pain   Ms. Kristina Stewart is a 34 y.o. year old G72P1011 female at [redacted]w[redacted]d weeks gestation who presents to MAU reporting cramping in the lower pelvis after lunch and continued and getting stronger (rated 8/10). She denies VB. She reports since she has been laying down here, the cramping has become intermittent (now rated 5/10). She reports increased vaginal discharge today. 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 She reports she tried to scan herself and was not able to see her cervix very well and that "scared" her. She receives her [facility] at [facility].   OB History    Gravida  3   Para  1   Term  1   Preterm      AB  1   Living  1     SAB  1   TAB      Ectopic      Multiple  1   Live Births  1           Past Medical History:  Diagnosis Date  . Allergy   . Asthma   . IBS (irritable bowel syndrome)   . Obesity   . Seasonal allergic rhinitis   . Strep pharyngitis   . TMJ (temporomandibular joint syndrome)   . Vertigo     Past Surgical History:  Procedure Laterality Date  . BRAIN SURGERY     craniotomy/arnold chiari malformation; craniotomy & laminectomy  . BRAIN SURGERY     craniotomy/arnold chiari malformation    Family History  Problem Relation Age of Onset  . Hypertension Mother   . Thyroid disease Mother   . Hyperlipidemia Mother   . Neuropathy Mother   . Hypertension Father   . Irritable bowel syndrome Father   . Hyperlipidemia Father   . GER disease Sister   . Fragile X syndrome Sister   . Alzheimer's disease Paternal Grandfather   . Diabetes Paternal Grandmother   . Fibromyalgia Paternal Grandmother   . Thyroid disease Maternal Grandmother        HYPOTHYROID  . Hypertension Maternal Grandmother   . Cancer Maternal Grandmother         LUNG  . Cancer Maternal Grandfather        LUNG  . CAD Maternal Uncle     Social History   Tobacco Use  . Smoking status: Never Smoker  . Smokeless tobacco: Never Used  Vaping Use  . Vaping Use: Never used  Substance Use Topics  . Alcohol use: No    Alcohol/week: 4.0 standard drinks    Types: 4 Glasses of wine per week  . Drug use: No    Allergies:  Allergies  Allergen Reactions  . Latex Rash  . Almond (Diagnostic) Itching  . Other Itching    PT IS ALLERGIC PITTED FRUITS    Medications Prior to Admission  Medication Sig Dispense Refill Last Dose  . Doxylamine-Pyridoxine 10-10 MG TBEC Take 2 tablets by mouth at bedtime.     . Doxylamine-Pyridoxine 10-10 MG TBEC Take 1 tablet by mouth daily at 6 (six) AM.     . promethazine (PHENERGAN) 12.5 MG tablet Take 12.5 mg by mouth daily as needed for nausea or vomiting.     Marland Kitchen albuterol (VENTOLIN HFA)  108 (90 Base) MCG/ACT inhaler Inhale 2 puffs into the lungs every 6 (six) hours as needed for wheezing or shortness of breath. 6.7 g prn   . Azelastine-Fluticasone 137-50 MCG/ACT SUSP Place 1 spray into the nose 2 (two) times daily. 23 g 5   . budesonide-formoterol (SYMBICORT) 80-4.5 MCG/ACT inhaler Inhale 2 puffs into the lungs 2 (two) times daily as needed. Rinse mouth after each use. 1 Inhaler 5   . clindamycin (CLINDAGEL) 1 % gel Apply topically 2 (two) times daily. 30 g 0   . Crisaborole (EUCRISA) 2 % OINT Apply 1 application topically 2 (two) times daily as needed. 100 g 5   . cyclobenzaprine (FLEXERIL) 10 MG tablet Take 1 tablet (10 mg total) by mouth 3 (three) times daily as needed for muscle spasms. 90 tablet 0   . EPINEPHrine 0.3 mg/0.3 mL IJ SOAJ injection SMARTSIG:0.3 Milliliter(s) IM Once PRN     . fluticasone (FLONASE) 50 MCG/ACT nasal spray Place 2 sprays into both nostrils daily. (Patient not taking: Reported on 09/11/2019) 16 g 6   . levocetirizine (XYZAL ALLERGY 24HR) 5 MG tablet Take 5 mg by mouth every evening.      . montelukast (SINGULAIR) 10 MG tablet Take 1 tablet (10 mg total) by mouth at bedtime. 90 tablet 3   . Multiple Vitamin (MULTIVITAMIN PO) Take by mouth.     Marland Kitchen olopatadine (PATANOL) 0.1 % ophthalmic solution Place 1 drop into both eyes 2 (two) times daily as needed for allergies (itchy/watery eyes). 5 mL 5   . triamcinolone ointment (KENALOG) 0.1 % Apply 1 application topically 2 (two) times daily. 30 g 2   . VITAMIN D PO Take 50 mcg by mouth daily.       Review of Systems  Constitutional: Negative.   HENT: Negative.   Eyes: Negative.   Respiratory: Negative.   Cardiovascular: Negative.   Gastrointestinal: Negative.   Endocrine: Negative.   Genitourinary: Positive for pelvic pain and vaginal discharge (increased amount today, no irritation).  Musculoskeletal: Negative.   Skin: Negative.   Allergic/Immunologic: Negative.   Neurological: Negative.   Hematological: Negative.   Psychiatric/Behavioral: Negative.    Physical Exam   Blood pressure 101/60, pulse 98, temperature 98.4 F (36.9 C), temperature source Oral, resp. rate 18, height 5\' 5"  (1.651 m), weight 109.7 kg, SpO2 99 %, unknown if currently breastfeeding.  Physical Exam Vitals and nursing note reviewed. Exam conducted with a chaperone present.  Constitutional:      Appearance: She is well-developed. She is obese.  HENT:     Head: Normocephalic and atraumatic.  Cardiovascular:     Rate and Rhythm: Normal rate.  Pulmonary:     Effort: Pulmonary effort is normal.  Abdominal:     Tenderness: There is abdominal tenderness in the right lower quadrant and left lower quadrant.  Neurological:     Mental Status: She is alert.    FHTs by doppler: 150 bpm  MAU Course  Procedures  MDM CCUA Wet Prep GC/CT -- Results pending  Results for orders placed or performed during the hospital encounter of 02/09/20 (from the past 24 hour(s))  Urinalysis, Routine w reflex microscopic Urine, Clean Catch     Status: Abnormal    Collection Time: 02/09/20  4:26 PM  Result Value Ref Range   Color, Urine YELLOW YELLOW   APPearance CLEAR CLEAR   Specific Gravity, Urine 1.009 1.005 - 1.030   pH 7.0 5.0 - 8.0   Glucose, UA NEGATIVE NEGATIVE mg/dL  Hgb urine dipstick NEGATIVE NEGATIVE   Bilirubin Urine NEGATIVE NEGATIVE   Ketones, ur NEGATIVE NEGATIVE mg/dL   Protein, ur NEGATIVE NEGATIVE mg/dL   Nitrite NEGATIVE NEGATIVE   Leukocytes,Ua TRACE (A) NEGATIVE   RBC / HPF 0-5 0 - 5 RBC/hpf   WBC, UA 0-5 0 - 5 WBC/hpf   Bacteria, UA RARE (A) NONE SEEN   Squamous Epithelial / LPF 0-5 0 - 5   Mucus PRESENT   Wet prep, genital     Status: Abnormal   Collection Time: 02/09/20  6:45 PM   Specimen: Vaginal  Result Value Ref Range   Yeast Wet Prep HPF POC NONE SEEN NONE SEEN   Trich, Wet Prep NONE SEEN NONE SEEN   Clue Cells Wet Prep HPF POC NONE SEEN NONE SEEN   WBC, Wet Prep HPF POC MANY (A) NONE SEEN   Sperm NONE SEEN     Assessment and Plan  Pelvic pain affecting pregnancy in second trimester, antepartum  - Advised to Tylenol 1000 mg every 6 hours as needed for pain - Advised to purchase maternity support belt from [employer] (for both below and above support) - Information provided on pelvic pain in pregnancy   Vaginal discharge during pregnancy in second trimester  - Advised of normal vaginal discharge findings on wet prep - no tx needed  - Discharge patient - Keep scheduled appt with [facility] on 02/23/20 - Patient verbalized an understanding of the plan of care and agrees.    Raelyn Mora, MSN, CNM 02/09/2020, 6:33 PM

## 2020-02-09 NOTE — Discharge Instructions (Signed)
You can take Tylenol 1000 mg every hours as needed for pain. Purchase the maternity support belt shown from [employer] and more frequent breaks while working.

## 2020-02-09 NOTE — MAU Note (Signed)
Started after lunch, feeling cramping in lower abd.  Has continued, seems to be stronger. No bleeding that she knows of.

## 2020-02-12 LAB — GC/CHLAMYDIA PROBE AMP (~~LOC~~) NOT AT ARMC
Chlamydia: NEGATIVE
Comment: NEGATIVE
Comment: NORMAL
Neisseria Gonorrhea: NEGATIVE

## 2020-02-14 ENCOUNTER — Other Ambulatory Visit: Payer: Self-pay

## 2020-02-14 ENCOUNTER — Other Ambulatory Visit: Payer: No Typology Code available for payment source

## 2020-02-14 DIAGNOSIS — Z20822 Contact with and (suspected) exposure to covid-19: Secondary | ICD-10-CM

## 2020-02-16 LAB — SARS-COV-2, NAA 2 DAY TAT

## 2020-02-16 LAB — NOVEL CORONAVIRUS, NAA: SARS-CoV-2, NAA: NOT DETECTED

## 2020-02-19 ENCOUNTER — Other Ambulatory Visit: Payer: Self-pay

## 2020-02-19 DIAGNOSIS — Z20822 Contact with and (suspected) exposure to covid-19: Secondary | ICD-10-CM

## 2020-02-21 LAB — NOVEL CORONAVIRUS, NAA: SARS-CoV-2, NAA: NOT DETECTED

## 2020-02-21 LAB — SARS-COV-2, NAA 2 DAY TAT

## 2020-02-23 ENCOUNTER — Other Ambulatory Visit (HOSPITAL_COMMUNITY): Payer: Self-pay | Admitting: Obstetrics and Gynecology

## 2020-02-23 MED FILL — SERTRALINE HCL 25 MG TABLET: 25 | 90 days supply | Qty: 90 | Fill #0

## 2020-02-23 MED FILL — DOXYLAMINE-PYRIDOXINE 10-10: 10-10 | 30 days supply | Qty: 120 | Fill #0

## 2020-02-26 ENCOUNTER — Other Ambulatory Visit: Payer: Self-pay

## 2020-03-12 ENCOUNTER — Ambulatory Visit: Payer: No Typology Code available for payment source | Admitting: Allergy

## 2020-04-03 ENCOUNTER — Ambulatory Visit: Payer: No Typology Code available for payment source | Admitting: Allergy

## 2020-04-17 LAB — HM PAP SMEAR

## 2020-04-19 MED FILL — DOXYLAMINE-PYRIDOXINE 10-10: 10-10 | 30 days supply | Qty: 120 | Fill #0

## 2020-04-19 MED FILL — AZELASTINE-FLUTICASONE 137-: 137-50 | 30 days supply | Qty: 23 | Fill #1

## 2020-04-23 LAB — OB RESULTS CONSOLE HIV ANTIBODY (ROUTINE TESTING): HIV: NONREACTIVE

## 2020-05-03 ENCOUNTER — Telehealth: Payer: Self-pay

## 2020-05-03 ENCOUNTER — Ambulatory Visit (INDEPENDENT_AMBULATORY_CARE_PROVIDER_SITE_OTHER): Payer: No Typology Code available for payment source | Admitting: Internal Medicine

## 2020-05-03 ENCOUNTER — Other Ambulatory Visit: Payer: Self-pay

## 2020-05-03 ENCOUNTER — Encounter: Payer: Self-pay | Admitting: Internal Medicine

## 2020-05-03 VITALS — BP 110/80 | HR 91 | Temp 98.2°F | Ht 64.0 in | Wt 250.0 lb

## 2020-05-03 DIAGNOSIS — H6501 Acute serous otitis media, right ear: Secondary | ICD-10-CM | POA: Diagnosis not present

## 2020-05-03 NOTE — Telephone Encounter (Signed)
OK to see her today

## 2020-05-03 NOTE — Progress Notes (Signed)
   Subjective:    Patient ID: Kristina Stewart, female    DOB: 02/07/1986, 34 y.o.   MRN: [id number]  HPI She is [redacted] weeks pregnant. Has done fine with pregnancy. Yesterday noted crackling sensation in right ear. Hx allergic rhinitis. No fever, chills, sore throat, No cough.    Review of Systems see above- Baby due by C-section mid January     Objective:   Physical Exam Temperature 98.2 degrees orally pulse 91 regular blood pressure 110/80 weight 250 pounds BMI 42.91  Patient seen in no acute distress.  No tachypnea.  Right TM is slightly full but not red.  Left TM is clear.  Pharynx is clear.  Neck is supple without adenopathy.  Chest clear to auscultation.       Assessment & Plan:  She has a mild right serous otitis media  Plan: Suggested Sudafed Sudafed for this.   I do not think she needs an antibiotic at this point in time.  However, she tells me she cannot tolerate decongestants.  She will call if symptoms do not improve.  I think it is fine to wait and see if symptoms will improve.

## 2020-05-03 NOTE — Telephone Encounter (Signed)
Scheduled

## 2020-05-03 NOTE — Telephone Encounter (Signed)
Patient called yesterday morning she woke up with ear fullness and today her right ear is very painful and she feels some fluid in there she is requesting to be seen today? 12:30pm? No fever, no other symptoms.

## 2020-05-03 NOTE — Patient Instructions (Addendum)
She does not tolerate decongestants. She will call if symptoms worsen and warrant an antibiotic.

## 2020-05-15 MED FILL — SERTRALINE HCL 25 MG TABLET: 25 | 90 days supply | Qty: 90 | Fill #1

## 2020-06-17 ENCOUNTER — Other Ambulatory Visit (HOSPITAL_COMMUNITY): Payer: Self-pay | Admitting: Obstetrics and Gynecology

## 2020-06-17 MED FILL — DOXYLAMINE-PYRIDOXINE 10-10: 10-10 | 30 days supply | Qty: 120 | Fill #0

## 2020-06-18 LAB — OB RESULTS CONSOLE GBS: GBS: NEGATIVE

## 2020-06-22 ENCOUNTER — Telehealth: Payer: No Typology Code available for payment source | Admitting: Orthopedic Surgery

## 2020-06-22 DIAGNOSIS — R0989 Other specified symptoms and signs involving the circulatory and respiratory systems: Secondary | ICD-10-CM | POA: Diagnosis not present

## 2020-06-22 MED ORDER — AMOXICILLIN-POT CLAVULANATE 875-125 MG PO TABS: 1.0000 | ORAL_TABLET | Freq: Two times a day (BID) | ORAL | 0 refills | Status: AC

## 2020-06-22 NOTE — Progress Notes (Signed)
We are sorry that you are not feeling well.  Here is how we plan to help!  Based on what you have shared with me it looks like you have sinusitis.  Sinusitis is inflammation and infection in the sinus cavities of the head.  Based on your presentation I believe you most likely have Acute Bacterial Sinusitis.  This is an infection caused by bacteria and is treated with antibiotics. I have prescribed Augmentin 875mg /125mg  one tablet twice daily with food, for 7 days. You may use an oral decongestant such as Mucinex D or if you have glaucoma or high blood pressure use plain Mucinex. Saline nasal spray help and can safely be used as often as needed for congestion.  If you develop worsening sinus pain, fever or notice severe headache and vision changes, or if symptoms are not better after completion of antibiotic, please schedule an appointment with a health care provider.    This antibiotic is safe with your pregnancy.  Sinus infections are not as easily transmitted as other respiratory infection, however we still recommend that you avoid close contact with loved ones, especially the very young and elderly.  Remember to wash your hands thoroughly throughout the day as this is the number one way to prevent the spread of infection!  Home Care:  Only take medications as instructed by your medical team.  Complete the entire course of an antibiotic.  Do not take these medications with alcohol.  A steam or ultrasonic humidifier can help congestion.  You can place a towel over your head and breathe in the steam from hot water coming from a faucet.  Avoid close contacts especially the very young and the elderly.  Cover your mouth when you cough or sneeze.  Always remember to wash your hands.  Get Help Right Away If:  You develop worsening fever or sinus pain.  You develop a severe head ache or visual changes.  Your symptoms persist after you have completed your treatment plan.  Make sure  you  Understand these instructions.  Will watch your condition.  Will get help right away if you are not doing well or get worse.  Your e-visit answers were reviewed by a board certified advanced clinical practitioner to complete your personal care plan.  Depending on the condition, your plan could have included both over the counter or prescription medications.  If there is a problem please reply  once you have received a response from your provider.  Your safety is important to .  If you have drug allergies check your prescription carefully.    You can use MyChart to ask questions about today's visit, request a non-urgent call back, or ask for a work or school excuse for 24 hours related to this e-Visit. If it has been greater than 24 hours you will need to follow up with your provider, or enter a new e-Visit to address those concerns.  You will get an e-mail in the next two days asking about your experience.  I hope that your e-visit has been valuable and will speed your recovery. Thank you for using e-visits.  Greater than 5 minutes, yet less than 10 minutes of time have been spent researching, coordinating and implementing care for this patient today.

## 2020-06-24 ENCOUNTER — Other Ambulatory Visit: Payer: Self-pay | Admitting: Internal Medicine

## 2020-06-24 ENCOUNTER — Other Ambulatory Visit (HOSPITAL_COMMUNITY): Payer: Self-pay | Admitting: Internal Medicine

## 2020-06-24 MED FILL — MONTELUKAST SOD 10 MG TAB: 10 | 90 days supply | Qty: 90 | Fill #0

## 2020-06-27 ENCOUNTER — Encounter (HOSPITAL_COMMUNITY): Payer: Self-pay | Admitting: *Deleted

## 2020-06-27 ENCOUNTER — Telehealth (HOSPITAL_COMMUNITY): Payer: Self-pay | Admitting: *Deleted

## 2020-06-27 NOTE — Telephone Encounter (Signed)
Preadmission screen  

## 2020-06-29 ENCOUNTER — Telehealth: Payer: No Typology Code available for payment source | Admitting: Nurse Practitioner

## 2020-06-29 DIAGNOSIS — K122 Cellulitis and abscess of mouth: Secondary | ICD-10-CM | POA: Diagnosis not present

## 2020-06-29 MED ORDER — CLINDAMYCIN HCL 300 MG PO CAPS
300.0000 mg | ORAL_CAPSULE | Freq: Four times a day (QID) | ORAL | 0 refills | Status: DC
Start: 2020-06-29 — End: 2020-07-11

## 2020-06-29 MED ORDER — NAPROXEN 500 MG PO TABS
500.0000 mg | ORAL_TABLET | Freq: Two times a day (BID) | ORAL | 1 refills | Status: DC
Start: 2020-06-29 — End: 2020-07-11

## 2020-06-29 NOTE — L&D Delivery Note (Signed)
Delivery Note ° °SVD viable female Apgars 9,9 over 2nd degree ML lac.  Placenta delivered spontaneously intact with 3VC. °Repair with 2-0 Chromic with good support and hemostasis noted.  R/V exam confirms.  PH art was not done.  ° °Mother and baby to couplet care and are doing well. ° °EBL 100cc ° °Yarissa Reining, MD  °

## 2020-06-29 NOTE — Progress Notes (Signed)
E-Visit for Dental Pain  We are sorry that you are not feeling well.  Here is how we plan to help!  Based on what you have shared with me in the questionnaire, it sounds like you have abscess in mouth. Sometimes you need a stronger antibiotic for the oral cavity.  naprosyn 500mg  2 times per day for 7 days for discomfort and Clindamycin 300mg  4 times per day for days  It is imperative that you see a dentist within 10 days of this eVisit to determine the cause of the dental pain and be sure it is adequately treated  A toothache or tooth pain is caused when the nerve in the root of a tooth or surrounding a tooth is irritated. Dental (tooth) infection, decay, injury, or loss of a tooth are the most common causes of dental pain. Pain may also occur after an extraction (tooth is pulled out). Pain sometimes originates from other areas and radiates to the jaw, thus appearing to be tooth pain.Bacteria growing inside your mouth can contribute to gum disease and dental decay, both of which can cause pain. A toothache occurs from inflammation of the central portion of the tooth called pulp. The pulp contains nerve endings that are very sensitive to pain. Inflammation to the pulp or pulpitis may be caused by dental cavities, trauma, and infection.    HOME CARE:   For toothaches: . Over-the-counter pain medications such as acetaminophen or ibuprofen may be used. Take these as directed on the package while you arrange for a dental appointment. . Avoid very cold or hot foods, because they may make the pain worse. . You may get relief from biting on a cotton ball soaked in oil of cloves. You can get oil of cloves at most drug stores.  For jaw pain: .  Aspirin may be helpful for problems in the joint of the jaw in adults. . If pain happens every time you open your mouth widely, the temporomandibular joint (TMJ) may be the source of the pain. Yawning or taking a large bite of food may worsen the pain. An  appointment with your doctor or dentist will help you find the cause.     GET HELP RIGHT AWAY IF:  . You have a high fever or chills . If you have had a recent head or face injury and develop headache, light headedness, nausea, vomiting, or other symptoms that concern you after an injury to your face or mouth, you could have a more serious injury in addition to your dental injury. . A facial rash associated with a toothache: This condition may improve with medication. Contact your doctor for them to decide what is appropriate. . Any jaw pain occurring with chest pain: Although jaw pain is most commonly caused by dental disease, it is sometimes referred pain from other areas. People with heart disease, especially people who have had stents placed, people with diabetes, or those who have had heart surgery may have jaw pain as a symptom of heart attack or angina. If your jaw or tooth pain is associated with lightheadedness, sweating, or shortness of breath, you should see a doctor as soon as possible. . Trouble swallowing or excessive pain or bleeding from gums: If you have a history of a weakened immune system, diabetes, or steroid use, you may be more susceptible to infections. Infections can often be more severe and extensive or caused by unusual organisms. Dental and gum infections in people with these conditions may require more aggressive  treatment. An abscess may need draining or IV antibiotics, for example.  MAKE SURE YOU    Understand these instructions.  Will watch your condition.  Will get help right away if you are not doing well or get worse.  Thank you for choosing an e-visit. Your e-visit answers were reviewed by a board certified advanced clinical practitioner to complete your personal care plan. Depending upon the condition, your plan could have included both over the counter or prescription medications. Please review your pharmacy choice. Make sure the pharmacy is open so you  can pick up prescription now. If there is a problem, you may contact your provider through [employer] and have the prescription routed to another pharmacy. Your safety is important to [place]. If you have drug allergies check your prescription carefully.  For the next 24 hours you can use MyChart to ask questions about today's visit, request a non-urgent call back, or ask for a work or school excuse. You will get an email in the next two days asking about your experience. I hope that your e-visit has been valuable and will speed your recovery.  5-10 minutes spent reviewing and documenting in chart.

## 2020-07-09 ENCOUNTER — Inpatient Hospital Stay (HOSPITAL_COMMUNITY): Payer: No Typology Code available for payment source | Admitting: Anesthesiology

## 2020-07-09 ENCOUNTER — Other Ambulatory Visit (HOSPITAL_COMMUNITY): Payer: Self-pay | Admitting: Allergy

## 2020-07-09 ENCOUNTER — Inpatient Hospital Stay (HOSPITAL_COMMUNITY)
Admission: AD | Admit: 2020-07-09 | Discharge: 2020-07-11 | DRG: 807 | Disposition: A | Discharge status: Home / Self Care | Payer: No Typology Code available for payment source | Attending: Obstetrics and Gynecology | Admitting: Obstetrics and Gynecology

## 2020-07-09 ENCOUNTER — Other Ambulatory Visit: Payer: Self-pay | Admitting: Allergy

## 2020-07-09 ENCOUNTER — Other Ambulatory Visit: Payer: Self-pay

## 2020-07-09 ENCOUNTER — Other Ambulatory Visit (HOSPITAL_COMMUNITY)
Admission: RE | Admit: 2020-07-09 | Discharge: 2020-07-09 | Disposition: A | Discharge status: Home / Self Care | Payer: No Typology Code available for payment source | Source: Ambulatory Visit | Attending: Obstetrics & Gynecology | Admitting: Obstetrics & Gynecology

## 2020-07-09 ENCOUNTER — Encounter (HOSPITAL_COMMUNITY): Payer: Self-pay | Admitting: Obstetrics and Gynecology

## 2020-07-09 DIAGNOSIS — J45909 Unspecified asthma, uncomplicated: Secondary | ICD-10-CM | POA: Diagnosis present

## 2020-07-09 DIAGNOSIS — O9962 Diseases of the digestive system complicating childbirth: Secondary | ICD-10-CM | POA: Diagnosis present

## 2020-07-09 DIAGNOSIS — Z3A38 38 weeks gestation of pregnancy: Secondary | ICD-10-CM

## 2020-07-09 DIAGNOSIS — O26893 Other specified pregnancy related conditions, third trimester: Secondary | ICD-10-CM | POA: Diagnosis present

## 2020-07-09 DIAGNOSIS — Z20822 Contact with and (suspected) exposure to covid-19: Secondary | ICD-10-CM | POA: Diagnosis present

## 2020-07-09 DIAGNOSIS — O99214 Obesity complicating childbirth: Principal | ICD-10-CM | POA: Diagnosis present

## 2020-07-09 DIAGNOSIS — Z79899 Other long term (current) drug therapy: Secondary | ICD-10-CM | POA: Diagnosis not present

## 2020-07-09 DIAGNOSIS — R030 Elevated blood-pressure reading, without diagnosis of hypertension: Secondary | ICD-10-CM | POA: Diagnosis not present

## 2020-07-09 DIAGNOSIS — R519 Headache, unspecified: Secondary | ICD-10-CM | POA: Diagnosis present

## 2020-07-09 DIAGNOSIS — O99891 Other specified diseases and conditions complicating pregnancy: Secondary | ICD-10-CM | POA: Diagnosis not present

## 2020-07-09 DIAGNOSIS — O4292 Full-term premature rupture of membranes, unspecified as to length of time between rupture and onset of labor: Principal | ICD-10-CM

## 2020-07-09 DIAGNOSIS — Z01812 Encounter for preprocedural laboratory examination: Secondary | ICD-10-CM | POA: Insufficient documentation

## 2020-07-09 DIAGNOSIS — Z3A00 Weeks of gestation of pregnancy not specified: Secondary | ICD-10-CM | POA: Diagnosis not present

## 2020-07-09 DIAGNOSIS — Z91040 Latex allergy status: Secondary | ICD-10-CM | POA: Diagnosis not present

## 2020-07-09 LAB — CBC
HCT: 39.6 % (ref 36.0–46.0)
Hemoglobin: 13.3 g/dL (ref 12.0–15.0)
MCH: 27.7 pg (ref 26.0–34.0)
MCHC: 33.6 g/dL (ref 30.0–36.0)
MCV: 82.5 fL (ref 80.0–100.0)
Platelets: 312 10*3/uL (ref 150–400)
RBC: 4.8 MIL/uL (ref 3.87–5.11)
RDW: 15.2 % (ref 11.5–15.5)
WBC: 15 10*3/uL — ABNORMAL HIGH (ref 4.0–10.5)
nRBC: 0 % (ref 0.0–0.2)

## 2020-07-09 LAB — RESP PANEL BY RT-PCR (FLU A&B, COVID) ARPGX2
Influenza A by PCR: NEGATIVE
Influenza B by PCR: NEGATIVE
SARS Coronavirus 2 by RT PCR: NEGATIVE

## 2020-07-09 LAB — TYPE AND SCREEN
ABO/RH(D): O POS
Antibody Screen: NEGATIVE

## 2020-07-09 LAB — SARS CORONAVIRUS 2 (TAT 6-24 HRS): SARS Coronavirus 2: NEGATIVE

## 2020-07-09 LAB — POCT FERN TEST: POCT Fern Test: POSITIVE

## 2020-07-09 MED ORDER — LACTATED RINGERS IV SOLN: 500.0000 mL | INTRAVENOUS | Status: DC | PRN

## 2020-07-09 MED ORDER — FENTANYL-BUPIVACAINE-NACL 0.5-0.125-0.9 MG/250ML-% EP SOLN
12.0000 mL/h | EPIDURAL | Status: DC | PRN
  Filled 2020-07-09: qty 250

## 2020-07-09 MED ORDER — LIDOCAINE HCL (PF) 1 % IJ SOLN: 30.0000 mL | INTRAMUSCULAR | Status: DC | PRN

## 2020-07-09 MED ORDER — LIDOCAINE HCL (PF) 1 % IJ SOLN
INTRAMUSCULAR | Status: DC | PRN
  Administered 2020-07-09 (×2): 4 mL via EPIDURAL

## 2020-07-09 MED ORDER — ACETAMINOPHEN 325 MG PO TABS: 650.0000 mg | ORAL_TABLET | ORAL | Status: DC | PRN

## 2020-07-09 MED ORDER — EPHEDRINE 5 MG/ML INJ: 10.0000 mg | INTRAVENOUS | Status: DC | PRN

## 2020-07-09 MED ORDER — LACTATED RINGERS IV SOLN: 500.0000 mL | Freq: Once | INTRAVENOUS | Status: DC

## 2020-07-09 MED ORDER — OXYCODONE-ACETAMINOPHEN 5-325 MG PO TABS: 2.0000 | ORAL_TABLET | ORAL | Status: DC | PRN

## 2020-07-09 MED ORDER — ONDANSETRON HCL 4 MG/2ML IJ SOLN
4.0000 mg | Freq: Four times a day (QID) | INTRAMUSCULAR | Status: DC | PRN
  Administered 2020-07-09: 4 mg via INTRAVENOUS
  Filled 2020-07-09: qty 2

## 2020-07-09 MED ORDER — FLEET ENEMA 7-19 GM/118ML RE ENEM: 1.0000 | ENEMA | RECTAL | Status: DC | PRN

## 2020-07-09 MED ORDER — OXYCODONE-ACETAMINOPHEN 5-325 MG PO TABS: 1.0000 | ORAL_TABLET | ORAL | Status: DC | PRN

## 2020-07-09 MED ORDER — LACTATED RINGERS IV SOLN: INTRAVENOUS | Status: DC

## 2020-07-09 MED ORDER — DIPHENHYDRAMINE HCL 50 MG/ML IJ SOLN: 12.5000 mg | INTRAMUSCULAR | Status: DC | PRN

## 2020-07-09 MED ORDER — PHENYLEPHRINE 40 MCG/ML (10ML) SYRINGE FOR IV PUSH (FOR BLOOD PRESSURE SUPPORT)
80.0000 ug | PREFILLED_SYRINGE | INTRAVENOUS | Status: DC | PRN
  Administered 2020-07-09: 80 ug via INTRAVENOUS
  Filled 2020-07-09: qty 10

## 2020-07-09 MED ORDER — SODIUM CHLORIDE (PF) 0.9 % IJ SOLN
INTRAMUSCULAR | Status: DC | PRN
  Administered 2020-07-09: 12 mL/h via EPIDURAL

## 2020-07-09 MED ORDER — SOD CITRATE-CITRIC ACID 500-334 MG/5ML PO SOLN: 30.0000 mL | ORAL | Status: DC | PRN

## 2020-07-09 MED ORDER — OXYTOCIN BOLUS FROM INFUSION
333.0000 mL | Freq: Once | INTRAVENOUS | Status: AC
  Administered 2020-07-09: 333 mL via INTRAVENOUS

## 2020-07-09 MED ORDER — PHENYLEPHRINE 40 MCG/ML (10ML) SYRINGE FOR IV PUSH (FOR BLOOD PRESSURE SUPPORT): 80.0000 ug | PREFILLED_SYRINGE | INTRAVENOUS | Status: DC | PRN

## 2020-07-09 MED ORDER — OXYTOCIN-SODIUM CHLORIDE 30-0.9 UT/500ML-% IV SOLN
2.5000 [IU]/h | INTRAVENOUS | Status: DC
  Administered 2020-07-09: 2.5 [IU]/h via INTRAVENOUS
  Filled 2020-07-09: qty 500

## 2020-07-09 MED FILL — AZELASTINE-FLUTICASONE 137-: 137-50 | 30 days supply | Qty: 23 | Fill #0

## 2020-07-09 NOTE — Anesthesia Preprocedure Evaluation (Addendum)
Anesthesia Evaluation  Patient identified by MRN, date of birth, ID band Patient awake    Reviewed: Allergy & Precautions, Patient's Chart, lab work & pertinent test results  History of Anesthesia Complications Negative for: history of anesthetic complications  Airway Mallampati: II  TM Distance: >3 FB Neck ROM: Full    Dental no notable dental hx.    Pulmonary asthma ,    Pulmonary exam normal        Cardiovascular negative cardio ROS Normal cardiovascular exam     Neuro/Psych S/p Chiari malformation repair negative psych ROS   GI/Hepatic negative GI ROS, Neg liver ROS,   Endo/Other  Morbid obesity  Renal/GU negative Renal ROS  negative genitourinary   Musculoskeletal negative musculoskeletal ROS (+)   Abdominal   Peds  Hematology negative hematology ROS (+)   Anesthesia Other Findings Homozygous MTHFR mutation   Reproductive/Obstetrics (+) Pregnancy                           Anesthesia Physical Anesthesia Plan  ASA: III  Anesthesia Plan: Epidural   Post-op Pain Management:    Induction:   PONV Risk Score and Plan: Treatment may vary due to age or medical condition  Airway Management Planned: Natural Airway  Additional Equipment:   Intra-op Plan:   Post-operative Plan:   Informed Consent: I have reviewed the patients History and Physical, chart, labs and discussed the procedure including the risks, benefits and alternatives for the proposed anesthesia with the patient or authorized representative who has indicated his/her understanding and acceptance.       Plan Discussed with:   Anesthesia Plan Comments:         Anesthesia Quick Evaluation

## 2020-07-09 NOTE — H&P (Signed)
Kristina Stewart is a 35 y.o. female presenting for srom and labor after having membranes swept by Dr Elon Spanner in office today.  Pregnancy was unsure if wanted Primary c/s due to prolonged labor and pushing first pregancy ending with Vacuum delivery.  Was scheduled for IOL in 2 days and now that in labor, wants to go vaginally.  US shows 49%tile EFW.  GBS-. OB History    Gravida  3   Para  1   Term  1   Preterm      AB  1   Living  1     SAB  1   IAB      Ectopic      Multiple  0   Live Births  1          Past Medical History:  Diagnosis Date  . Allergy   . Asthma   . Family history of adverse reaction to anesthesia    sister problems with propofol  . IBS (irritable bowel syndrome)   . Obesity   . Seasonal allergic rhinitis   . Strep pharyngitis   . TMJ (temporomandibular joint syndrome)   . Vertigo    Past Surgical History:  Procedure Laterality Date  . BRAIN SURGERY     craniotomy/arnold chiari malformation; craniotomy & laminectomy  . BRAIN SURGERY     craniotomy/arnold chiari malformation   Family History: family history includes Alzheimer's disease in her paternal grandfather; CAD in her maternal uncle; Cancer in her maternal grandfather and maternal grandmother; Diabetes in her paternal grandmother; Fibromyalgia in her paternal grandmother; Fragile X syndrome in her sister; GER disease in her sister; Hyperlipidemia in her father and mother; Hypertension in her father, maternal grandmother, and mother; Irritable bowel syndrome in her father; Neuropathy in her mother; Thyroid disease in her maternal grandmother and mother. Social History:  reports that she has never smoked. She has never used smokeless tobacco. She reports that she does not drink alcohol and does not use drugs.     Maternal Diabetes: No Genetic Screening: Normal Maternal Ultrasounds/Referrals: Normal Fetal Ultrasounds or other Referrals:  None Maternal Substance Abuse:  No Significant  Maternal Medications:  None Significant Maternal Lab Results:  Group B Strep negative Other Comments:  None  Review of Systems History Dilation: 3 Effacement (%): 80 Station: -3 Exam by:: t lytle rn Blood pressure 138/83, pulse 98, temperature 98.4 F (36.9 C), temperature source Oral, resp. rate 20, height 5\' 5"  (1.651 m), weight 120 kg, SpO2 99 %, unknown if currently breastfeeding. Exam Physical Exam  Prenatal labs: ABO, Rh: --/--/O POS (01/11 1920) Antibody: NEG (01/11 1920) Rubella:   RPR:    HBsAg:    HIV:    GBS:     Assessment/Plan: IUP at term with SROM and labor sxs Wants vaginal delivery Will augment as necessary.   12-09-1986 07/09/2020, 8:32 PM

## 2020-07-09 NOTE — MAU Note (Signed)
Presents with c/o ctxs 5 minutes apart for 1.5 hours.  Reports membranes were stripped @ office visit today, cervix 4cms.  Scheduled for IOL on Thursday.

## 2020-07-09 NOTE — Anesthesia Procedure Notes (Signed)
Epidural Patient location during procedure: OB Start time: 07/09/2020 8:11 PM End time: 07/09/2020 8:14 PM  Staffing Anesthesiologist: Kaylyn Layer, MD Performed: anesthesiologist   Preanesthetic Checklist Completed: patient identified, IV checked, risks and benefits discussed, monitors and equipment checked, pre-op evaluation and timeout performed  Epidural Patient position: sitting Prep: DuraPrep and site prepped and draped Patient monitoring: continuous pulse ox, blood pressure and heart rate Approach: midline Location: L3-L4 Injection technique: LOR air  Needle:  Needle type: Tuohy  Needle gauge: 17 G Needle length: 9 cm Needle insertion depth: 7 cm Catheter type: closed end flexible Catheter size: 19 Gauge Catheter at skin depth: 12 cm Test dose: negative and Other (1% lidocaine)  Assessment Events: blood not aspirated, injection not painful, no injection resistance, no paresthesia and negative IV test  Additional Notes Patient identified. Risks, benefits, and alternatives discussed with patient including but not limited to bleeding, infection, nerve damage, paralysis, failed block, incomplete pain control, headache, blood pressure changes, nausea, vomiting, reactions to medication, itching, and postpartum back pain. Confirmed with bedside nurse the patient's most recent platelet count. Confirmed with patient that they are not currently taking any anticoagulation, have any bleeding history, or any family history of bleeding disorders. Patient expressed understanding and wished to proceed. All questions were answered. Sterile technique was used throughout the entire procedure. Please see nursing notes for vital signs.   Somewhat difficult placement due to patient movement. 2 attempts required. Crisp LOR on second attempt. Test dose was given through epidural catheter and negative prior to continuing to dose epidural or start infusion. Warning signs of high block given to the  patient including shortness of breath, tingling/numbness in hands, complete motor block, or any concerning symptoms with instructions to call for help. Patient was given instructions on fall risk and not to get out of bed. All questions and concerns addressed with instructions to call with any issues or inadequate analgesia.  Reason for block:procedure for pain

## 2020-07-09 NOTE — MAU Provider Note (Signed)
S: Ms. Kristina Stewart is a 35 y.o. G3P1011 at [redacted]w[redacted]d  who presents to MAU today complaining of leaking of fluid since coming to MAU. Patient reports she went to use the bathroom and had a small gush of clear fluid. Patient reports a similar experience breaking her water with her first child. She denies vaginal bleeding. She endorses contractions. She reports normal fetal movement.    O: BP 138/83 (BP Location: Right Arm)   Pulse 98   Temp 98.4 F (36.9 C) (Oral)   Resp 20   Ht 5\' 5"  (1.651 m)   Wt 120 kg   SpO2 99%   BMI 44.03 kg/m    Patient Vitals for the past 24 hrs:  BP Temp Temp src Pulse Resp SpO2 Height Weight  07/09/20 1735 138/83 98.4 F (36.9 C) Oral 98 20 99 % - -  07/09/20 1730 - - - - - - 5\' 5"  (1.651 m) 120 kg   GENERAL: Well-developed, well-nourished female in no acute distress.  HEAD: Normocephalic, atraumatic.  CHEST: Normal effort of breathing, regular heart rate ABDOMEN: Soft, nontender, gravid PELVIC: Normal external female genitalia. Vagina is pink and rugated. Cervix with normal contour, no lesions. Normal discharge.  Minimal pooling.   Fern: POSITIVE  Cervical exam:      Fetal Monitoring: Baseline: 145/135 Variability: moderate Accelerations: present. 15x15 Decelerations: absent Contractions: present, difficult to trace  No results found for this or any previous visit (from the past 24 hour(s)).   A: SIUP at [redacted]w[redacted]d  SROM  P: Report given to RN to contact MD on call for further instructions  Nugent, , NP 07/09/2020 6:41 PM

## 2020-07-10 ENCOUNTER — Encounter (HOSPITAL_COMMUNITY): Payer: Self-pay | Admitting: Obstetrics and Gynecology

## 2020-07-10 LAB — CBC
HCT: 35.2 % — ABNORMAL LOW (ref 36.0–46.0)
Hemoglobin: 11.4 g/dL — ABNORMAL LOW (ref 12.0–15.0)
MCH: 27.2 pg (ref 26.0–34.0)
MCHC: 32.4 g/dL (ref 30.0–36.0)
MCV: 84 fL (ref 80.0–100.0)
Platelets: 247 10*3/uL (ref 150–400)
RBC: 4.19 MIL/uL (ref 3.87–5.11)
RDW: 15.3 % (ref 11.5–15.5)
WBC: 17.8 10*3/uL — ABNORMAL HIGH (ref 4.0–10.5)
nRBC: 0 % (ref 0.0–0.2)

## 2020-07-10 LAB — RPR: RPR Ser Ql: NONREACTIVE

## 2020-07-10 MED ORDER — ONDANSETRON HCL 4 MG PO TABS: 4.0000 mg | ORAL_TABLET | ORAL | Status: DC | PRN

## 2020-07-10 MED ORDER — SENNOSIDES-DOCUSATE SODIUM 8.6-50 MG PO TABS
2.0000 | ORAL_TABLET | Freq: Every day | ORAL | Status: DC
  Administered 2020-07-10: 2 via ORAL
  Filled 2020-07-10 (×2): qty 2

## 2020-07-10 MED ORDER — COCONUT OIL OIL: 1.0000 "application " | TOPICAL_OIL | Status: DC | PRN

## 2020-07-10 MED ORDER — DIBUCAINE (PERIANAL) 1 % EX OINT: 1.0000 "application " | TOPICAL_OINTMENT | CUTANEOUS | Status: DC | PRN

## 2020-07-10 MED ORDER — ACETAMINOPHEN 325 MG PO TABS
650.0000 mg | ORAL_TABLET | ORAL | Status: DC | PRN
  Administered 2020-07-10: 650 mg via ORAL
  Filled 2020-07-10: qty 2

## 2020-07-10 MED ORDER — PRENATAL MULTIVITAMIN CH
1.0000 | ORAL_TABLET | Freq: Every day | ORAL | Status: DC
  Administered 2020-07-10: 1 via ORAL
  Filled 2020-07-10: qty 1

## 2020-07-10 MED ORDER — FLUTICASONE PROPIONATE 50 MCG/ACT NA SUSP
2.0000 | Freq: Every day | NASAL | Status: DC
  Administered 2020-07-10: 2 via NASAL
  Filled 2020-07-10: qty 16

## 2020-07-10 MED ORDER — SERTRALINE HCL 25 MG PO TABS
25.0000 mg | ORAL_TABLET | Freq: Every day | ORAL | Status: DC
  Administered 2020-07-10: 25 mg via ORAL
  Filled 2020-07-10: qty 1

## 2020-07-10 MED ORDER — MEDROXYPROGESTERONE ACETATE 150 MG/ML IM SUSP: 150.0000 mg | INTRAMUSCULAR | Status: DC | PRN

## 2020-07-10 MED ORDER — OXYCODONE-ACETAMINOPHEN 5-325 MG PO TABS
1.0000 | ORAL_TABLET | ORAL | Status: DC | PRN
Start: 2020-07-10 — End: 2020-07-11

## 2020-07-10 MED ORDER — BENZOCAINE-MENTHOL 20-0.5 % EX AERO
1.0000 "application " | INHALATION_SPRAY | CUTANEOUS | Status: DC | PRN
  Filled 2020-07-10: qty 56

## 2020-07-10 MED ORDER — ONDANSETRON HCL 4 MG/2ML IJ SOLN: 4.0000 mg | INTRAMUSCULAR | Status: DC | PRN

## 2020-07-10 MED ORDER — WITCH HAZEL-GLYCERIN EX PADS: 1.0000 "application " | MEDICATED_PAD | CUTANEOUS | Status: DC | PRN

## 2020-07-10 MED ORDER — DIPHENHYDRAMINE HCL 25 MG PO CAPS: 25.0000 mg | ORAL_CAPSULE | Freq: Four times a day (QID) | ORAL | Status: DC | PRN

## 2020-07-10 MED ORDER — OXYCODONE-ACETAMINOPHEN 5-325 MG PO TABS
2.0000 | ORAL_TABLET | ORAL | Status: DC | PRN
Start: 2020-07-10 — End: 2020-07-11

## 2020-07-10 MED ORDER — TETANUS-DIPHTH-ACELL PERTUSSIS 5-2.5-18.5 LF-MCG/0.5 IM SUSY: 0.5000 mL | PREFILLED_SYRINGE | Freq: Once | INTRAMUSCULAR | Status: DC

## 2020-07-10 MED ORDER — ZOLPIDEM TARTRATE 5 MG PO TABS: 5.0000 mg | ORAL_TABLET | Freq: Every evening | ORAL | Status: DC | PRN

## 2020-07-10 MED ORDER — SIMETHICONE 80 MG PO CHEW: 80.0000 mg | CHEWABLE_TABLET | ORAL | Status: DC | PRN

## 2020-07-10 MED ORDER — IBUPROFEN 600 MG PO TABS
600.0000 mg | ORAL_TABLET | Freq: Four times a day (QID) | ORAL | Status: DC
  Administered 2020-07-10 – 2020-07-11 (×6): 600 mg via ORAL
  Filled 2020-07-10 (×6): qty 1

## 2020-07-10 MED ORDER — MEASLES, MUMPS & RUBELLA VAC IJ SOLR: 0.5000 mL | Freq: Once | INTRAMUSCULAR | Status: DC

## 2020-07-10 MED ORDER — MONTELUKAST SODIUM 10 MG PO TABS
10.0000 mg | ORAL_TABLET | Freq: Every day | ORAL | Status: DC
  Administered 2020-07-10: 10 mg via ORAL
  Filled 2020-07-10: qty 1

## 2020-07-10 MED ORDER — ALBUTEROL SULFATE (2.5 MG/3ML) 0.083% IN NEBU: 3.0000 mL | INHALATION_SOLUTION | Freq: Four times a day (QID) | RESPIRATORY_TRACT | Status: DC | PRN

## 2020-07-10 NOTE — Progress Notes (Signed)
Postpartum Progress Note  Post Partum Day 1 s/p spontaneous vaginal delivery.  Patient reports well-controlled pain, ambulating without difficulty, voiding spontaneously, tolerating PO.  Vaginal bleeding is appropriate.   Objective: Blood pressure 116/70, pulse 78, temperature 97.7 F (36.5 C), temperature source Oral, resp. rate 17, height 5\' 5"  (1.651 m), weight 120 kg, SpO2 99 %, unknown if currently breastfeeding.  Physical Exam:  General: alert Lochia: appropriate Uterine Fundus: firm DVT Evaluation: No evidence of DVT seen on physical exam.  Recent Labs    07/09/20 1903 07/10/20 0618  HGB 13.3 11.4*  HCT 39.6 35.2*    Assessment/Plan: . Postpartum Day 1, s/p vaginal delivery. . Continue routine postpartum care . Lactation following . Circ desired, will perform when baby cleared . Anticipate discharge home tomorrow   LOS: 1 day   09/07/20 07/10/2020, 8:02 AM

## 2020-07-10 NOTE — Anesthesia Postprocedure Evaluation (Signed)
Anesthesia Post Note  Patient: Kristina Stewart  Procedure(s) Performed: AN AD HOC LABOR EPIDURAL     Patient location during evaluation: Mother Baby Anesthesia Type: Epidural Level of consciousness: awake and alert and oriented Pain management: satisfactory to patient Vital Signs Assessment: post-procedure vital signs reviewed and stable Respiratory status: respiratory function stable Cardiovascular status: stable Postop Assessment: no headache, no backache, epidural receding, patient able to bend at knees, no signs of nausea or vomiting, adequate PO intake and able to ambulate Anesthetic complications: no Comments: The patient stated that her epidural "kind of worked" and that her left side was more numb than her right where she felt right pelvic and vaginal pain. She reported that she "went so fast " it was fine and that Dr. Stephannie Peters was excellent.    No complications documented.  Last Vitals:  Vitals:   07/10/20 0230 07/10/20 0630  BP: 140/66 116/70  Pulse: 93 78  Resp: 17   Temp: 36.6 C 36.5 C  SpO2: 97% 99%    Last Pain:  Vitals:   07/10/20 0630  TempSrc: Oral  PainSc:    Pain Goal:                   Natnael Biederman

## 2020-07-10 NOTE — Lactation Note (Signed)
This note was copied from a baby's chart. Lactation Consultation Note  Patient Name: Kristina Stewart BULAG'T Date: 07/10/2020 Reason for consult: Initial assessment;Early term 37-38.6wks;Infant weight loss;Other (Comment) (1 % weight loss) Age:33 hours Mom latched the baby - see below , swallows noted.  Worked on depth. Per mom comfortable.  Mom mentioned with her 1st baby she had to start supplementing and the most she could pump off 30 ml at a time. Also had problems with plugged ducts.  Since she could not keep up with the baby's appetite and stopped breast feeding at 6 weeks.  Per mom + breast changes with both pregnancies.  LC recommended for now STS, offer the baby breast  with feeding cues, and 8-12 times a day. When D/C tomorrow add post pumping both breast for 10- 20 mins for extra supplementing.  LC provided the employee benefits DEBP and completed paper work. Mom received a copy of the paperwork.  LC provided the [facility] brochure  Maternal Data Has patient been taught Hand Expression?: No (expeienced - per mom feels comfortable) Does the patient have breastfeeding experience prior to this delivery?: Yes  Feeding Feeding Type: Breast Fed  LATCH Score Latch: Grasps breast easily, tongue down, lips flanged, rhythmical sucking.  Audible Swallowing: Spontaneous and intermittent  Type of Nipple: Everted at rest and after stimulation  Comfort (Breast/Nipple): Soft / non-tender  Hold (Positioning): Assistance needed to correctly position infant at breast and maintain latch.  LATCH Score: 9  Interventions Interventions: Breast feeding basics reviewed  Lactation Tools Discussed/Used     Consult Status Consult Status: Follow-up Date: 07/10/20 Follow-up type: In-patient    [name] 07/10/2020, 1:38 PM

## 2020-07-11 ENCOUNTER — Other Ambulatory Visit: Payer: Self-pay

## 2020-07-11 ENCOUNTER — Other Ambulatory Visit (HOSPITAL_COMMUNITY): Payer: Self-pay | Admitting: Obstetrics & Gynecology

## 2020-07-11 ENCOUNTER — Encounter (HOSPITAL_COMMUNITY): Payer: Self-pay | Admitting: Obstetrics & Gynecology

## 2020-07-11 ENCOUNTER — Inpatient Hospital Stay (HOSPITAL_COMMUNITY)
Admission: AD | Admit: 2020-07-11 | Discharge: 2020-07-11 | Disposition: A | Discharge status: Home / Self Care | Payer: No Typology Code available for payment source | Attending: Obstetrics & Gynecology | Admitting: Obstetrics & Gynecology

## 2020-07-11 ENCOUNTER — Inpatient Hospital Stay (HOSPITAL_COMMUNITY)
Admission: AD | Admit: 2020-07-11 | Payer: No Typology Code available for payment source | Source: Home / Self Care | Admitting: Obstetrics & Gynecology

## 2020-07-11 ENCOUNTER — Inpatient Hospital Stay (HOSPITAL_COMMUNITY): Payer: No Typology Code available for payment source

## 2020-07-11 DIAGNOSIS — R519 Headache, unspecified: Secondary | ICD-10-CM | POA: Insufficient documentation

## 2020-07-11 DIAGNOSIS — R030 Elevated blood-pressure reading, without diagnosis of hypertension: Secondary | ICD-10-CM | POA: Diagnosis not present

## 2020-07-11 DIAGNOSIS — Z91040 Latex allergy status: Secondary | ICD-10-CM | POA: Insufficient documentation

## 2020-07-11 DIAGNOSIS — O99891 Other specified diseases and conditions complicating pregnancy: Secondary | ICD-10-CM

## 2020-07-11 DIAGNOSIS — Z3A00 Weeks of gestation of pregnancy not specified: Secondary | ICD-10-CM

## 2020-07-11 DIAGNOSIS — Z79899 Other long term (current) drug therapy: Secondary | ICD-10-CM | POA: Insufficient documentation

## 2020-07-11 LAB — CBC
HCT: 35.6 % — ABNORMAL LOW (ref 36.0–46.0)
Hemoglobin: 11.7 g/dL — ABNORMAL LOW (ref 12.0–15.0)
MCH: 27.7 pg (ref 26.0–34.0)
MCHC: 32.9 g/dL (ref 30.0–36.0)
MCV: 84.2 fL (ref 80.0–100.0)
Platelets: 286 10*3/uL (ref 150–400)
RBC: 4.23 MIL/uL (ref 3.87–5.11)
RDW: 15.5 % (ref 11.5–15.5)
WBC: 14.7 10*3/uL — ABNORMAL HIGH (ref 4.0–10.5)
nRBC: 0 % (ref 0.0–0.2)

## 2020-07-11 LAB — COMPREHENSIVE METABOLIC PANEL
ALT: 23 U/L (ref 0–44)
AST: 24 U/L (ref 15–41)
Albumin: 2.7 g/dL — ABNORMAL LOW (ref 3.5–5.0)
Alkaline Phosphatase: 97 U/L (ref 38–126)
Anion gap: 10 (ref 5–15)
BUN: 14 mg/dL (ref 6–20)
CO2: 24 mmol/L (ref 22–32)
Calcium: 9.7 mg/dL (ref 8.9–10.3)
Chloride: 107 mmol/L (ref 98–111)
Creatinine, Ser: 0.79 mg/dL (ref 0.44–1.00)
GFR, Estimated: >60 mL/min (ref >60)
Glucose, Bld: 83 mg/dL (ref 70–99)
Potassium: 4 mmol/L (ref 3.5–5.1)
Sodium: 141 mmol/L (ref 135–145)
Total Bilirubin: 0.4 mg/dL (ref 0.3–1.2)
Total Protein: 5.9 g/dL — ABNORMAL LOW (ref 6.5–8.1)

## 2020-07-11 LAB — URINALYSIS, COMPLETE (UACMP) WITH MICROSCOPIC
Bilirubin Urine: NEGATIVE
Glucose, UA: NEGATIVE mg/dL
Ketones, ur: NEGATIVE mg/dL
Nitrite: NEGATIVE
Protein, ur: NEGATIVE mg/dL
RBC / HPF: >50 RBC/hpf — ABNORMAL HIGH (ref 0–5)
Specific Gravity, Urine: 1.01 (ref 1.005–1.030)
pH: 6 (ref 5.0–8.0)

## 2020-07-11 MED ORDER — CYCLOBENZAPRINE HCL 5 MG PO TABS
10.0000 mg | ORAL_TABLET | Freq: Once | ORAL | Status: AC
  Administered 2020-07-11: 10 mg via ORAL
  Filled 2020-07-11: qty 2

## 2020-07-11 MED ORDER — ACETAMINOPHEN 325 MG PO TABS
650.0000 mg | ORAL_TABLET | Freq: Once | ORAL | Status: AC
  Administered 2020-07-11: 650 mg via ORAL
  Filled 2020-07-11: qty 2

## 2020-07-11 MED ORDER — IBUPROFEN 600 MG PO TABS: 600.0000 mg | ORAL_TABLET | Freq: Four times a day (QID) | ORAL | 0 refills | Status: DC

## 2020-07-11 MED FILL — IBUPROFEN 600 MG TABLET: 600 | 7 days supply | Qty: 30 | Fill #0

## 2020-07-11 NOTE — Discharge Instructions (Signed)
How to Take Your Blood Pressure Blood pressure is a measurement of how strongly your blood is pressing against the walls of your arteries. Arteries are blood vessels that carry blood from your heart throughout your body. Your health care provider takes your blood pressure at each office visit. You can also take your own blood pressure at home with a blood pressure monitor. You may need to take your own blood pressure to:  Confirm a diagnosis of high blood pressure (hypertension).  Monitor your blood pressure over time.  Make sure your blood pressure medicine is working. Supplies needed:  Blood pressure monitor.  Dining room chair to sit in.  Table or desk.  Small notebook and pencil or pen. How to prepare To get the most accurate reading, avoid the following for 30 minutes before you check your blood pressure:  Drinking caffeine.  Drinking alcohol.  Eating.  Smoking.  Exercising. Five minutes before you check your blood pressure:  Use the bathroom and urinate so that you have an empty bladder.  Sit quietly in a dining room chair. Do not sit in a soft couch or an armchair. Do not talk. How to take your blood pressure To check your blood pressure, follow the instructions in the manual that came with your blood pressure monitor. If you have a digital blood pressure monitor, the instructions may be as follows: 1. Sit up straight in a chair. 2. Place your feet on the floor. Do not cross your ankles or legs. 3. Rest your left arm at the level of your heart on a table or desk or on the arm of a chair. 4. Pull up your shirt sleeve. 5. Wrap the blood pressure cuff around the upper part of your left arm, 1 inch (2.5 cm) above your elbow. It is best to wrap the cuff around bare skin. 6. Fit the cuff snugly around your arm. You should be able to place only one finger between the cuff and your arm. 7. Position the cord so that it rests in the bend of your elbow. 8. Press the power  button. 9. Sit quietly while the cuff inflates and deflates. 10. Read the digital reading on the monitor screen and write the numbers down (record them) in a notebook. 11. Wait 2-3 minutes, then repeat the steps, starting at step 1.   What does my blood pressure reading mean? A blood pressure reading consists of a higher number over a lower number. Ideally, your blood pressure should be below 120/80. The first ("top") number is called the systolic pressure. It is a measure of the pressure in your arteries as your heart beats. The second ("bottom") number is called the diastolic pressure. It is a measure of the pressure in your arteries as the heart relaxes. Blood pressure is classified into five stages. The following are the stages for adults who do not have a short-term serious illness or a chronic condition. Systolic pressure and diastolic pressure are measured in a unit called mm Hg (millimeters of mercury).  Normal  Systolic pressure: below 120.  Diastolic pressure: below 80. Elevated  Systolic pressure: 120-129.  Diastolic pressure: below 80. Hypertension stage 1  Systolic pressure: 130-139.  Diastolic pressure: 80-89. Hypertension stage 2  Systolic pressure: 140 or above.  Diastolic pressure: 90 or above. You can have elevated blood pressure or hypertension even if only the systolic or only the diastolic number in your reading is higher than normal. Follow these instructions at home:  Check your blood   pressure as often as recommended by your health care provider.  Check your blood pressure at the same time every day.  Take your monitor to the next appointment with your health care provider to make sure that: ? You are using it correctly. ? It provides accurate readings.  Be sure you understand what your goal blood pressure numbers are.  Tell your health care provider if you are having any side effects from blood pressure medicine.  Keep all follow-up visits as told by  your health care provider. This is important. General tips  Your health care provider can suggest a reliable monitor that will meet your needs. There are several types of home blood pressure monitors.  Choose a monitor that has an arm cuff. Do not choose a monitor that measures your blood pressure from your wrist or finger.  Choose a cuff that wraps snugly around your upper arm. You should be able to fit only one finger between your arm and the cuff.  You can buy a blood pressure monitor at most drugstores or online. Where to find more information American Heart Association: www.heart.org Contact a health care provider if:  Your blood pressure is consistently high. Get help right away if:  Your systolic blood pressure is higher than 180.  Your diastolic blood pressure is higher than 120. Summary  Blood pressure is a measurement of how strongly your blood is pressing against the walls of your arteries.  A blood pressure reading consists of a higher number over a lower number. Ideally, your blood pressure should be below 120/80.  Check your blood pressure at the same time every day.  Avoid caffeine, alcohol, smoking, and exercise for 30 minutes prior to checking your blood pressure. These agents can affect the accuracy of the blood pressure reading. This information is not intended to replace advice given to you by your health care provider. Make sure you discuss any questions you have with your health care provider. Document Revised: 06/09/2019 Document Reviewed: 06/09/2019 Elsevier Patient Education  2021 Elsevier Inc.  

## 2020-07-11 NOTE — Discharge Summary (Signed)
Postpartum Discharge Summary    Patient Name: Kristina Stewart DOB: 1986-05-22 MRN: [id number]  Date of admission: 07/09/2020 Delivery date:07/09/2020  Delivering provider: Louretta Shorten  Date of discharge: 07/11/2020  Admitting diagnosis: Normal labor and delivery [O80] Intrauterine pregnancy: [redacted]w[redacted]d    Secondary diagnosis:  Active Problems:   Normal labor and delivery  Additional problems: none    Discharge diagnosis: Term Pregnancy Delivered                                              Post partum procedures:non Augmentation: N/A Complications: None  Hospital course: Onset of Labor With Vaginal Delivery      35y.o. yo [id number] admitted in Active Labor on 07/09/2020. Patient had an uncomplicated labor course as follows:  Membrane Rupture Time/Date: 5:45 PM ,07/09/2020   Delivery Method:Vaginal, Spontaneous  Episiotomy: None  Lacerations:  2nd degree  Patient had an uncomplicated postpartum course.  She is ambulating, tolerating a regular diet, passing flatus, and urinating well. Patient is discharged home in stable condition on 07/11/20.  Newborn Data: Birth date:07/09/2020  Birth time:10:30 PM  Gender:Female  Living status:Living  Apgars:8 ,9  Weight:3351 g   Magnesium Sulfate received: No BMZ received: No Rhophylac:No MMR:No T-DaP:Given prenatally Flu: No Transfusion:No  Physical exam  Vitals:   07/10/20 1540 07/10/20 2004 07/10/20 2140 07/11/20 0520  BP: (!) 122/58 (!) 143/64 128/70 123/68  Pulse: 72 70 74 68  Resp: _0 Temp: 98.4 F (36.9 C) 97.6 F (36.4 C)  97.9 F (36.6 C)  TempSrc: Oral Oral  Oral  SpO2: 100% 99%  100%  Weight:      Height:       General: alert, cooperative and no distress Lochia: appropriate Uterine Fundus: firm Incision: Healing well with no significant drainage, No significant erythema DVT Evaluation: No evidence of DVT seen on physical exam. Negative Homan's sign. No cords or calf  tenderness. Labs: Lab Results  Component Value Date   WBC 17.8 (H) 07/10/2020   HGB 11.4 (L) 07/10/2020   HCT 35.2 (L) 07/10/2020   MCV 84.0 07/10/2020   PLT 247 07/10/2020   CMP Latest Ref Rng & Units 04/21/2019  Glucose 65 - 99 mg/dL 80  BUN 7 - 25 mg/dL 13  Creatinine 0.50 - 1.10 mg/dL 0.74  Sodium 135 - 146 mmol/L 139  Potassium 3.5 - 5.3 mmol/L 4.1  Chloride 98 - 110 mmol/L 106  CO2 20 - 32 mmol/L 25  Calcium 8.6 - 10.2 mg/dL 9.2  Total Protein 6.1 - 8.1 g/dL 6.6  Total Bilirubin 0.2 - 1.2 mg/dL 0.4  Alkaline Phos 39 - 117 U/L -  AST 10 - 30 U/L 12  ALT 6 - 29 U/L 10   Edinburgh Score: Edinburgh Postnatal Depression Scale Screening Tool 07/10/2020  I have been able to laugh and see the funny side of things. 0  I have looked forward with enjoyment to things. 0  I have blamed myself unnecessarily when things went wrong. 1  I have been anxious or worried for no good reason. 1  I have felt scared or panicky for no good reason. 0  Things have been getting on top of me. 1  I have been so unhappy that I have had difficulty sleeping. 0  I have felt sad or miserable.  0  I have been so unhappy that I have been crying. 0  The thought of harming myself has occurred to me. 0  Edinburgh Postnatal Depression Scale Total 3     After visit meds:  Allergies as of 07/11/2020      Reactions   Latex Rash   Almond (diagnostic) Itching   Other Itching   PT IS ALLERGIC PITTED FRUITS      Medication List    STOP taking these medications   clindamycin 1 % gel Commonly known as: Clindagel   clindamycin 300 MG capsule Commonly known as: Cleocin   cyclobenzaprine 10 MG tablet Commonly known as: FLEXERIL   Doxylamine-Pyridoxine 10-10 MG Tbec   EPINEPHrine 0.3 mg/0.3 mL Soaj injection Commonly known as: EPI-PEN   naproxen 500 MG tablet Commonly known as: Naprosyn   promethazine 12.5 MG tablet Commonly known as: PHENERGAN   VITAMIN D PO     TAKE these medications    albuterol 108 (90 Base) MCG/ACT inhaler Commonly known as: VENTOLIN HFA Inhale 2 puffs into the lungs every 6 (six) hours as needed for wheezing or shortness of breath.   Azelastine-Fluticasone 137-50 MCG/ACT Susp USE 1 SPRAY INTO THE NOSE TWICE A DAY AS DIRECTED   budesonide-formoterol 80-4.5 MCG/ACT inhaler Commonly known as: Symbicort Inhale 2 puffs into the lungs 2 (two) times daily as needed. Rinse mouth after each use.   Eucrisa 2 % Oint Generic drug: Crisaborole Apply 1 application topically 2 (two) times daily as needed.   fluticasone 50 MCG/ACT nasal spray Commonly known as: FLONASE Place 2 sprays into both nostrils daily.   ibuprofen 600 MG tablet Commonly known as: ADVIL Take 1 tablet (600 mg total) by mouth every 6 (six) hours.   montelukast 10 MG tablet Commonly known as: SINGULAIR TAKE 1 TABLET (10 MG TOTAL) BY MOUTH AT BEDTIME.   MULTIVITAMIN PO Take by mouth.   olopatadine 0.1 % ophthalmic solution Commonly known as: PATANOL Place 1 drop into both eyes 2 (two) times daily as needed for allergies (itchy/watery eyes).   sertraline 25 MG tablet Commonly known as: ZOLOFT Take 25 mg by mouth daily.   triamcinolone ointment 0.1 % Commonly known as: KENALOG Apply 1 application topically 2 (two) times daily.   Xyzal Allergy 24HR 5 MG tablet Generic drug: levocetirizine Take 5 mg by mouth every evening.        Discharge home in stable condition Infant Feeding: Breast Infant Disposition:home with mother Discharge instruction: per After Visit Summary and Postpartum booklet. Activity: Advance as tolerated. Pelvic rest for 6 weeks.  Diet: routine diet Future Appointments:No future appointments. Follow up Visit: 6 weeks postpartum visit   07/11/2020 Linda Hedges, DO

## 2020-07-11 NOTE — Lactation Note (Signed)
This note was copied from a baby's chart. Lactation Consultation Note  Patient Name: Kristina Stewart QHUTM'L Date: 07/11/2020 Reason for consult: Follow-up assessment;Infant weight loss;Early term 37-38.6wks;Other (Comment) (6 % weight loss, baby in the tx nursery for circ,) Age:33 hours, Serum Bili - 5.5.  Per mom started supplementing due to baby nit being satisfied. Baby is still latching well.  LC recommended when mom goes home after feeding the baby until milk comes in to post pump after 5-6 feedings for 10 -15 mins , and supplement EBM , formula if needed. Also offer both breast for each feeding is the baby is satisfied hold off on the supplement , if not keep it at 30 ml.  As milk comes in think prevention for engorgement .  Sore nipple and engorgement prevention and tx reviewed.  Mom has a HAKKA , hand pump, and DEBP at home.  LC recommended and offered to place [facility] O/P appt request in Epic. Mom consented and LC placed the request for LC O/P next week for check on milk supply.  Mom has the [facility] brochure with resource numbers and web site.   Maternal Data    Feeding Feeding Type:  (mom aware to feed with feedng cues and 8-12 times a day ,)  LATCH Score                   Interventions Interventions: Breast feeding basics reviewed  Lactation Tools Discussed/Used WIC Program: No Pump Education: Milk Storage   Consult Status Consult Status: Complete Date: 07/11/20    [name] 07/11/2020, 9:38 AM

## 2020-07-11 NOTE — Progress Notes (Signed)
Post Partum Day 2 Subjective: no complaints, up ad lib, voiding and tolerating PO.  Desires circ.  Objective: Blood pressure 123/68, pulse 68, temperature 97.9 F (36.6 C), temperature source Oral, resp. rate 16, height 5\' 5"  (1.651 m), weight 120 kg, SpO2 100 %, unknown if currently breastfeeding.  Physical Exam:  General: alert, cooperative and appears stated age Lochia: appropriate Uterine Fundus: firm Incision: healing well DVT Evaluation: No evidence of DVT seen on physical exam. Negative Homan's sign. No cords or calf tenderness.  Recent Labs    07/09/20 1903 07/10/20 0618  HGB 13.3 11.4*  HCT 39.6 35.2*    Assessment/Plan: Discharge home, Breastfeeding and Circumcision prior to discharge Counseled for circ including risk of bleeding, infection, and scarring.  All questions were answered and patient wishes to proceed.   LOS: 2 days   09/07/20 07/11/2020, 9:30 AM

## 2020-07-11 NOTE — MAU Provider Note (Cosign Needed Addendum)
Patient Kristina Stewart is a 35 y.o. [id number]  at Unknown here with complaints of headache and elevated BPs. She denies any history of DM or HBP in her pregnancy or outside of pregnancy. She denies blurry vision, floating spots, RUQ pain. She is two days s/p NSVD; reports that she was discharged home today and no one took her vitals.   In the car ride on the way home and while at home she felt "weird" and felt pre-syncopal. She checked her BP and it was 180/105 and then she rechecked it and it had come down to 140s, although this is still elevated for her.   She is very distressed appearing in MAU; is tearful about her blood pressure, possible -pre-e and being away from her newborn.   History     CSN: [id number]  Arrival date and time: 07/11/20 1552   Event Date/Time   First Provider Initiated Contact with Patient 07/11/20 1623      No chief complaint on file.  Headache  This is a new problem. The current episode started today. The problem occurs constantly. Pain location: "all over" The quality of the pain is described as aching. The pain is at a severity of 3/10. Pertinent negatives include no abdominal pain, blurred vision, fever, phonophobia, photophobia, seizures, visual change or vomiting.    OB History    Gravida  3   Para  2   Term  2   Preterm      AB  1   Living  2     SAB  1   IAB      Ectopic      Multiple  0   Live Births  2           Past Medical History:  Diagnosis Date  . Allergy   . Asthma   . Family history of adverse reaction to anesthesia    sister problems with propofol  . IBS (irritable bowel syndrome)   . Obesity   . Seasonal allergic rhinitis   . Strep pharyngitis   . TMJ (temporomandibular joint syndrome)   . Vertigo     Past Surgical History:  Procedure Laterality Date  . BRAIN SURGERY     craniotomy/arnold chiari malformation; craniotomy & laminectomy  . BRAIN SURGERY     craniotomy/arnold chiari malformation     Family History  Problem Relation Age of Onset  . Hypertension Mother   . Thyroid disease Mother   . Hyperlipidemia Mother   . Neuropathy Mother   . Hypertension Father   . Irritable bowel syndrome Father   . Hyperlipidemia Father   . GER disease Sister   . Fragile X syndrome Sister   . Alzheimer's disease Paternal Grandfather   . Diabetes Paternal Grandmother   . Fibromyalgia Paternal Grandmother   . Thyroid disease Maternal Grandmother        HYPOTHYROID  . Hypertension Maternal Grandmother   . Cancer Maternal Grandmother        LUNG  . Cancer Maternal Grandfather        LUNG  . CAD Maternal Uncle     Social History   Tobacco Use  . Smoking status: Never Smoker  . Smokeless tobacco: Never Used  Vaping Use  . Vaping Use: Never used  Substance Use Topics  . Alcohol use: No    Alcohol/week: 4.0 standard drinks    Types: 4 Glasses of wine per week  . Drug use: No    Allergies:  Allergies  Allergen Reactions  . Latex Rash  . Almond (Diagnostic) Itching  . Other Itching    PT IS ALLERGIC PITTED FRUITS    Medications Prior to Admission  Medication Sig Dispense Refill Last Dose  . albuterol (VENTOLIN HFA) 108 (90 Base) MCG/ACT inhaler Inhale 2 puffs into the lungs every 6 (six) hours as needed for wheezing or shortness of breath. 6.7 g prn   . Azelastine-Fluticasone 137-50 MCG/ACT SUSP USE 1 SPRAY INTO THE NOSE TWICE A DAY AS DIRECTED 23 g 3   . budesonide-formoterol (SYMBICORT) 80-4.5 MCG/ACT inhaler Inhale 2 puffs into the lungs 2 (two) times daily as needed. Rinse mouth after each use. 1 Inhaler 5   . Crisaborole (EUCRISA) 2 % OINT Apply 1 application topically 2 (two) times daily as needed. 100 g 5   . fluticasone (FLONASE) 50 MCG/ACT nasal spray Place 2 sprays into both nostrils daily. 16 g 6   . ibuprofen (ADVIL) 600 MG tablet Take 1 tablet (600 mg total) by mouth every 6 (six) hours. 30 tablet 0   . levocetirizine (XYZAL ALLERGY 24HR) 5 MG tablet Take 5 mg  by mouth every evening. (Patient not taking: Reported on 05/03/2020)     . montelukast (SINGULAIR) 10 MG tablet TAKE 1 TABLET (10 MG TOTAL) BY MOUTH AT BEDTIME. 90 tablet 2   . Multiple Vitamin (MULTIVITAMIN PO) Take by mouth.     Marland Kitchen olopatadine (PATANOL) 0.1 % ophthalmic solution Place 1 drop into both eyes 2 (two) times daily as needed for allergies (itchy/watery eyes). 5 mL 5   . sertraline (ZOLOFT) 25 MG tablet Take 25 mg by mouth daily.     Marland Kitchen triamcinolone ointment (KENALOG) 0.1 % Apply 1 application topically 2 (two) times daily. 30 g 2     Review of Systems  Constitutional: Negative.  Negative for fever.  HENT: Negative.   Eyes: Negative for blurred vision and photophobia.  Respiratory: Negative.   Gastrointestinal: Negative.  Negative for abdominal pain and vomiting.  Genitourinary: Negative.   Musculoskeletal: Negative.   Neurological: Positive for headaches. Negative for seizures.  Psychiatric/Behavioral: Negative.    Physical Exam   Blood pressure 111/64, pulse 71, temperature 98.1 F (36.7 C), temperature source Oral, resp. rate 15, SpO2 100 %, unknown if currently breastfeeding.  Physical Exam Constitutional:      Appearance: Normal appearance.  HENT:     Head: Normocephalic.  Pulmonary:     Effort: Pulmonary effort is normal. No respiratory distress.  Abdominal:     General: Abdomen is flat.     Palpations: Abdomen is soft.     Tenderness: There is no abdominal tenderness.  Skin:    General: Skin is warm.  Neurological:     Mental Status: She is alert.     MAU Course  Procedures Results for orders placed or performed during the hospital encounter of 07/11/20 (from the past 24 hour(s))  Urinalysis, Complete w Microscopic Urine, Clean Catch     Status: Abnormal   Collection Time: 07/11/20  4:44 PM  Result Value Ref Range   Color, Urine STRAW (A) YELLOW   APPearance CLEAR CLEAR   Specific Gravity, Urine 1.010 1.005 - 1.030   pH 6.0 5.0 - 8.0   Glucose, UA  NEGATIVE NEGATIVE mg/dL   Hgb urine dipstick LARGE (A) NEGATIVE   Bilirubin Urine NEGATIVE NEGATIVE   Ketones, ur NEGATIVE NEGATIVE mg/dL   Protein, ur NEGATIVE NEGATIVE mg/dL   Nitrite NEGATIVE NEGATIVE   Leukocytes,Ua SMALL (  A) NEGATIVE   RBC / HPF >50 (H) 0 - 5 RBC/hpf   WBC, UA 11-20 0 - 5 WBC/hpf   Bacteria, UA RARE (A) NONE SEEN   Squamous Epithelial / LPF 0-5 0 - 5   Mucus PRESENT   CBC     Status: Abnormal   Collection Time: 07/11/20  4:53 PM  Result Value Ref Range   WBC 14.7 (H) 4.0 - 10.5 K/uL   RBC 4.23 3.87 - 5.11 MIL/uL   Hemoglobin 11.7 (L) 12.0 - 15.0 g/dL   HCT 67.2 (L) 09.4 - 70.9 %   MCV 84.2 80.0 - 100.0 fL   MCH 27.7 26.0 - 34.0 pg   MCHC 32.9 30.0 - 36.0 g/dL   RDW 62.8 36.6 - 29.4 %   Platelets 286 150 - 400 K/uL   nRBC 0.0 0.0 - 0.2 %  Comprehensive metabolic panel     Status: Abnormal   Collection Time: 07/11/20  4:53 PM  Result Value Ref Range   Sodium 141 135 - 145 mmol/L   Potassium 4.0 3.5 - 5.1 mmol/L   Chloride 107 98 - 111 mmol/L   CO2 24 22 - 32 mmol/L   Glucose, Bld 83 70 - 99 mg/dL   BUN 14 6 - 20 mg/dL   Creatinine, Ser 7.65 0.44 - 1.00 mg/dL   Calcium 9.7 8.9 - 46.5 mg/dL   Total Protein 5.9 (L) 6.5 - 8.1 g/dL   Albumin 2.7 (L) 3.5 - 5.0 g/dL   AST 24 15 - 41 U/L   ALT 23 0 - 44 U/L   Alkaline Phosphatase 97 38 - 126 U/L   Total Bilirubin 0.4 0.3 - 1.2 mg/dL   GFR, Estimated >03 >54 mL/min   Anion gap 10 5 - 15    MDM -reassurance and support given; will draw pre-e labs and give Tylenol for HA.  -check urine for signs of dehydration, could be reason for HA.  -UA does not show signs of dehydration.   HA now a 2/10; patient is dressed and sitting in chair. She feels "much better".     Patient Vitals for the past 24 hrs:  BP Temp Temp src Pulse Resp SpO2  07/11/20 1746 111/64 - - 71 - -  07/11/20 1731 119/68 - - 61 - -  07/11/20 1716 126/68 - - 78 - -  07/11/20 1701 124/64 - - 70 - -  07/11/20 1636 132/62 98.1 F (36.7 C)  Oral 74 15 100 %   Patient care endorsed to [facility] CNM at 1800 with CMP pending.   Assessment and Plan    Charlesetta Garibaldi [facility] 07/11/2020, 5:57 PM   Reassessment (6:12 PM) -CMP returns without significant findings for PreE or HELLP. -Patient informed of findings. -Instructed to report any PreE type symptoms including HA not relieved with tylenol, SOB, visual disturbances, or RUQ pain. -Encouraged to call or return to MAU if symptoms worsen or with the onset of new symptoms. -Discharged to home in stable condition.  Cherre Robins MSN, CNM Advanced Practice Provider, [facility] [employer]

## 2020-07-11 NOTE — Discharge Instructions (Signed)
Call MD for T>100.4, heavy vaginal bleeding, severe abdominal pain, or respiratory distress.  Call office for postpartum visit in 6 weeks.  Pelvic rest x 6 weeks.

## 2020-07-15 ENCOUNTER — Encounter (HOSPITAL_COMMUNITY): Payer: Self-pay

## 2020-07-15 ENCOUNTER — Inpatient Hospital Stay (HOSPITAL_COMMUNITY): Admit: 2020-07-15 | Payer: No Typology Code available for payment source | Admitting: Obstetrics and Gynecology

## 2020-07-15 SURGERY — Surgical Case
Anesthesia: Choice

## 2020-07-17 ENCOUNTER — Inpatient Hospital Stay (HOSPITAL_COMMUNITY): Admit: 2020-07-17 | Payer: Self-pay

## 2020-08-15 MED FILL — SERTRALINE HCL 25 MG TABLET: 25 | 90 days supply | Qty: 90 | Fill #0

## 2020-08-22 ENCOUNTER — Other Ambulatory Visit (HOSPITAL_COMMUNITY): Payer: Self-pay | Admitting: Obstetrics and Gynecology

## 2020-08-22 MED FILL — ETONOGESTREL-ETHINYL ESTRAD: 0.12-0.015 | 84 days supply | Qty: 3 | Fill #0

## 2020-09-09 ENCOUNTER — Telehealth: Payer: No Typology Code available for payment source | Admitting: Physician Assistant

## 2020-09-09 ENCOUNTER — Other Ambulatory Visit (HOSPITAL_COMMUNITY): Payer: Self-pay | Admitting: Physician Assistant

## 2020-09-09 DIAGNOSIS — H1031 Unspecified acute conjunctivitis, right eye: Secondary | ICD-10-CM | POA: Diagnosis not present

## 2020-09-09 MED ORDER — POLYMYXIN B-TRIMETHOPRIM 10000-0.1 UNIT/ML-% OP SOLN: OPHTHALMIC | 0 refills | Status: DC

## 2020-09-09 NOTE — Progress Notes (Signed)
E-Visit for [employer]  We are sorry that you are not feeling well.  Here is how we plan to help!  Based on what you have shared with me it looks like you have conjunctivitis.  Conjunctivitis is a common inflammatory or infectious condition of the eye that is often referred to as "pink eye".  In most cases it is contagious (viral or bacterial). However, not all conjunctivitis requires antibiotics (ex. Allergic).  We have made appropriate suggestions for you based upon your presentation. In your case I feel you have developed a secondary bacterial/inflammatory conjunctivitis likely secondary to irritation/rubbing due to allergies. Please make sure you are taking your Singulair, antihistamine and the [place] drops. I am adding on a medication that is an antibiotic/antiinflammatory combination to help resolve the irritation/infection.   I have prescribed Polytrim Ophthalmic drops 1-2 drops 4 times a day times 5 days  Pink eye can be highly contagious.  It is typically spread through direct contact with secretions, or contaminated objects or surfaces that one may have touched.  Strict handwashing is suggested with soap and water is urged.  If not available, use alcohol based had sanitizer.  Avoid unnecessary touching of the eye.  If you wear contact lenses, you will need to refrain from wearing them until you see no white discharge from the eye for at least 24 hours after being on medication.  You should see symptom improvement in 1-2 days after starting the medication regimen.  Call us if symptoms are not improved in 1-2 days.  Home Care:  Wash your hands often!  Do not wear your contacts until you complete your treatment plan.  Avoid sharing towels, bed linen, personal items with a person who has pink eye.  See attention for anyone in your home with similar symptoms.  Get Help Right Away If:  Your symptoms do not improve.  You develop blurred or loss of vision.  Your symptoms worsen (increased  discharge, pain or redness)  Your e-visit answers were reviewed by a board certified advanced clinical practitioner to complete your personal care plan.  Depending on the condition, your plan could have included both over the counter or prescription medications.  If there is a problem please reply  once you have received a response from your provider.  Your safety is important to [place].  If you have drug allergies check your prescription carefully.    You can use MyChart to ask questions about today's visit, request a non-urgent call back, or ask for a work or school excuse for 24 hours related to this e-Visit. If it has been greater than 24 hours you will need to follow up with your provider, or enter a new e-Visit to address those concerns.   You will get an e-mail in the next two days asking about your experience.  I hope that your e-visit has been valuable and will speed your recovery. Thank you for using e-visits.

## 2020-09-09 NOTE — Progress Notes (Signed)
I have spent 5 minutes in review of e-visit questionnaire, review and updating patient chart, medical decision making and response to patient.   Abednego Yeates Cody Accalia Rigdon, PA-C    

## 2020-09-12 ENCOUNTER — Telehealth: Payer: No Typology Code available for payment source | Admitting: Emergency Medicine

## 2020-09-12 DIAGNOSIS — H5789 Other specified disorders of eye and adnexa: Secondary | ICD-10-CM

## 2020-09-12 NOTE — Progress Notes (Signed)
Hi Kristina Stewart,  Because you have already tried several antihistamines, including drops without relief, it is recommended you be seen in person for an eye exam.  An exam will help determine if you possibly have a foreign body (like a piece of dust) or scratch on your eye.  It can also help determine if you need to simply discontinue the medication or if a different antibiotic may be better for you at this time.  Without an exam, this is difficult to determine via E-visit.   Based on what you shared with me, I feel your condition warrants further evaluation and I recommend that you be seen for a face to face office visit. I apologize for any inconvenience but I want to make sure you are getting the best possible care for your eyes.    NOTE: If you entered your credit card information for this eVisit, you will not be charged. You may see a "hold" on your card for the $35 but that hold will drop off and you will not have a charge processed.   If you are having a true medical emergency please call 911.      For an urgent face to face visit, [place] has five urgent care centers for your convenience:     [facility] at [facility] Directions [phone] [address] [place], [place] [address] . 10 am - 6pm Monday - Friday    [facility]gent [facility] Center [facility]) Get Driving Directions [phone] [address] [place], [place] [address] . 10 am to 8 pm Monday-Friday . 12 pm to 8 pm [facility] Urgent Care at [facility] Get Driving Directions [phone] [address], [address] [place], [place] [address] . 8 am to 8 pm Monday-Friday . 9 am to 6 pm Saturday . 11 am to 6 pm Sunday     [facility] Health Urgent Care at [facility] Get Driving Directions  [phone] [address] [place], [place] [address] . 8 am to 8 pm Monday-Friday . 8 am to 4 pm [facility]  Urgent Care at [facility] Directions [phone] [address] [place], [place] [address] . 12 pm to 6 pm Monday-Friday      Your e-visit answers were reviewed by a board certified advanced clinical practitioner to complete your personal care plan.  Thank you for using e-Visits.    Approximately 5 minutes was spent documenting and reviewing patient's chart.

## 2020-10-10 MED FILL — Montelukast Sodium Tab 10 MG (Base Equiv): ORAL | 90 days supply | Qty: 90 | Fill #0 | Status: AC

## 2020-10-11 ENCOUNTER — Other Ambulatory Visit (HOSPITAL_COMMUNITY): Payer: Self-pay

## 2020-11-02 MED FILL — Azelastine HCl-Fluticasone Prop Nasal Spray 137-50 MCG/ACT: NASAL | 30 days supply | Qty: 23 | Fill #0 | Status: AC

## 2020-11-03 ENCOUNTER — Telehealth: Payer: No Typology Code available for payment source | Admitting: Family

## 2020-11-03 ENCOUNTER — Other Ambulatory Visit (HOSPITAL_COMMUNITY): Payer: Self-pay

## 2020-11-03 DIAGNOSIS — R399 Unspecified symptoms and signs involving the genitourinary system: Secondary | ICD-10-CM | POA: Diagnosis not present

## 2020-11-03 MED ORDER — CEPHALEXIN 500 MG PO CAPS
500.0000 mg | ORAL_CAPSULE | Freq: Two times a day (BID) | ORAL | 0 refills | Status: DC
  Filled 2020-11-03: qty 14, 7d supply, fill #0

## 2020-11-03 MED ORDER — CEPHALEXIN 500 MG PO CAPS: 500.0000 mg | ORAL_CAPSULE | Freq: Two times a day (BID) | ORAL | 0 refills | Status: DC

## 2020-11-03 NOTE — Progress Notes (Signed)

## 2020-11-03 NOTE — Addendum Note (Signed)
Addended by: Jannifer Rodney A on: 11/03/2020 09:11 AM   Modules accepted: Orders

## 2020-11-04 ENCOUNTER — Other Ambulatory Visit (HOSPITAL_COMMUNITY): Payer: Self-pay

## 2020-11-05 ENCOUNTER — Other Ambulatory Visit (HOSPITAL_COMMUNITY): Payer: Self-pay

## 2020-11-05 ENCOUNTER — Telehealth: Payer: No Typology Code available for payment source | Admitting: Physician Assistant

## 2020-11-05 DIAGNOSIS — J4531 Mild persistent asthma with (acute) exacerbation: Secondary | ICD-10-CM

## 2020-11-05 MED ORDER — PREDNISONE 20 MG PO TABS
40.0000 mg | ORAL_TABLET | Freq: Every day | ORAL | 0 refills | Status: DC
  Filled 2020-11-05: qty 10, 5d supply, fill #0

## 2020-11-05 MED ORDER — BENZONATATE 100 MG PO CAPS
100.0000 mg | ORAL_CAPSULE | Freq: Three times a day (TID) | ORAL | 0 refills | Status: DC | PRN
  Filled 2020-11-05: qty 30, 10d supply, fill #0

## 2020-11-05 MED ORDER — ALBUTEROL SULFATE (2.5 MG/3ML) 0.083% IN NEBU
2.5000 mg | INHALATION_SOLUTION | Freq: Four times a day (QID) | RESPIRATORY_TRACT | 1 refills | Status: DC | PRN
  Filled 2020-11-05: qty 150, 13d supply, fill #0

## 2020-11-05 NOTE — Progress Notes (Signed)
Visit for Asthma  Based on what you have shared with me, it looks like you may have a flare up of your asthma.  Asthma is a chronic (ongoing) lung disease which results in airway obstruction, inflammation and hyper-responsiveness.   Asthma symptoms vary from person to person, with common symptoms including nighttime awakening and decreased ability to participate in normal activities as a result of shortness of breath. It is often triggered by changes in weather, changes in the season, changes in air temperature, or inside (home, school, daycare or work) allergens such as animal dander, mold, mildew, woodstoves or cockroaches.   It can also be triggered by hormonal changes, extreme emotion, physical exertion or an upper respiratory tract illness.     It is important to identify the trigger, and then eliminate or avoid the trigger if possible.   If you have been prescribed medications to be taken on a regular basis, it is important to follow the asthma action plan and to follow guidelines to adjust medication in response to increasing symptoms of decreased peak expiratory flow rate  Treatment: I have prescribed: Albuterol (Proventil) (2.5 mg in 3 mL) 0.083 % Take by nebulization solution every six hours as needed for wheezing or shortness of breath and Prednisone 40mg  by mouth per day for 5 - 7 days. I will also send in a prescription for Tessalon to help with the cough.   HOME CARE . Only take medications as instructed by your medical team. . Consider wearing a mask or scarf to improve breathing air temperature have been shown to decrease irritation and decrease exacerbations . Get rest. . Taking a steamy shower or using a humidifier may help nasal congestion sand ease sore throat pain. You can place a towel over your head and breathe in the steam from hot water coming from a  faucet. . Using a saline nasal spray works much the same way.  . Cough drops, hare candies and sore throat lozenges may ease your cough.  . Avoid close contacts especially the very you and the elderly . Cover your mouth if you cough or sneeze . Always remember to wash your hands.    GET HELP RIGHT AWAY IF: . You develop worsening symptoms; breathlessness at rest, drowsy, confused or agitated, unable to speak in full sentences . You have coughing fits . You develop a severe headache or visual changes . You develop shortness of breath, difficulty breathing or start having chest pain . Your symptoms persist after you have completed your treatment plan . If your symptoms do not improve within 10 days  MAKE SURE YOU . Understand these instructions. . Will watch your condition. . Will get help right away if you are not doing well or get worse.   Your e-visit answers were reviewed by a board certified advanced clinical practitioner to complete your personal care plan, Depending upon the condition, your plan could have included both over the counter or prescription medications.  Please review your pharmacy choice. Your safety is important to . If you have drug allergies check your prescription carefully. You can use MyChart to ask questions about today's visit, request a non-urgent call back, or ask for a work or school excuse for 24 hours related to this e-Visit. If it has been greater than 24 hours you will need to follow up with your provider, or enter a new e-Visit to address those concerns.  You will get an e-mail in the next two days asking about your  experience. I hope that your e-visit has been valuable and will speed your recovery. Thank you for using e-visits.

## 2020-11-05 NOTE — Progress Notes (Signed)
I have spent 5 minutes in review of e-visit questionnaire, review and updating patient chart, medical decision making and response to patient.   Kristina Ida Cody Geofrey Silliman, PA-C    

## 2020-11-06 ENCOUNTER — Telehealth: Payer: No Typology Code available for payment source | Admitting: Physician Assistant

## 2020-11-06 DIAGNOSIS — Z20828 Contact with and (suspected) exposure to other viral communicable diseases: Secondary | ICD-10-CM

## 2020-11-06 DIAGNOSIS — R6889 Other general symptoms and signs: Secondary | ICD-10-CM

## 2020-11-06 DIAGNOSIS — Z20822 Contact with and (suspected) exposure to covid-19: Secondary | ICD-10-CM

## 2020-11-06 MED ORDER — OSELTAMIVIR PHOSPHATE 75 MG PO CAPS: 75.0000 mg | ORAL_CAPSULE | Freq: Two times a day (BID) | ORAL | 0 refills | Status: DC

## 2020-11-06 NOTE — Progress Notes (Signed)
I have spent 5 minutes in review of e-visit questionnaire, review and updating patient chart, medical decision making and response to patient.   Saory Carriero Cody Megin Consalvo, PA-C    

## 2020-11-06 NOTE — Progress Notes (Signed)

## 2020-11-06 NOTE — Addendum Note (Signed)
Addended by: Waldon Merl on: 11/06/2020 08:52 AM   Modules accepted: Orders

## 2020-11-09 ENCOUNTER — Other Ambulatory Visit (HOSPITAL_COMMUNITY): Payer: Self-pay

## 2020-11-09 ENCOUNTER — Other Ambulatory Visit: Payer: Self-pay | Admitting: Obstetrics and Gynecology

## 2020-11-09 MED FILL — Etonogestrel-Ethinyl Estradiol VA Ring 0.12-0.015 MG/24HR: VAGINAL | 84 days supply | Qty: 3 | Fill #0 | Status: AC

## 2020-11-11 ENCOUNTER — Other Ambulatory Visit (HOSPITAL_COMMUNITY): Payer: Self-pay

## 2020-11-12 ENCOUNTER — Other Ambulatory Visit: Payer: Self-pay | Admitting: Obstetrics and Gynecology

## 2020-11-12 ENCOUNTER — Other Ambulatory Visit (HOSPITAL_COMMUNITY): Payer: Self-pay

## 2020-11-13 ENCOUNTER — Other Ambulatory Visit (HOSPITAL_COMMUNITY): Payer: Self-pay

## 2020-11-13 NOTE — Progress Notes (Signed)
Follow Up Note  RE: Kristina Stewart MRN: [id number] DOB: 09-28-1985 Date of Office Visit: 11/14/2020  Referring provider: Margaree Mackintosh, MD Primary care provider: Margaree Mackintosh, MD  Chief Complaint: Immunotherapy (Would like to restart allergy injections.) and Asthma (Wants to make sure what and how she's taking is the proper medication for her asthma.)  History of Present Illness: I had the pleasure of seeing Kristina Stewart for a follow up visit at the [facility] on 11/14/2020. She is a 35 y.o. female, who is being followed for asthma, allergic rhinoconjunctivitis, oral allergy syndrome and dyshidrotic eczema. Her previous allergy office visit was on 11/09/2019 with Dr. Selena Batten via telemedicine. Today is a regular follow up visit.  Asthma  End of April patient went to [place] and was doing well.  In May when she came back she had an asthma flare with chest tightness, dry coughing, inability to take a deep breath. She thinks it was due to the sudden increase in pollen exposure in [place]. Restarted Symbicort 1 puff BID, albuterol as needed but then it got worse. She was started on albuterol nebulizer and prednisone.  Then she got the flu and symptoms are still persistent and breathing is not back to baseline yet.  Negative covid-19 testing.  Had positive flu and took tamiflu.  Still having clear productive cough.  Albuterol nebulizer causes shaking so she doesn't like to use that much.   Seasonal and perennial allergic rhinoconjunctivitis Patient delivered a baby boy in January 2022 and would like to restart allergy injections.  Having issues with itchy eyes, nose and PND. Currently taking Singulair 10mg  daily at night, dymista 1 spray BID, allegra in the morning, Xyzal at night, alaway prn with good benefit.  Still has significant PND.  Pollen-food allergy Avoiding fresh fruits and almonds.  Dyshidrotic eczema Stable and using triamcinolone/Eucrisa as needed  with good benefit.  Assessment and Plan: Kristina Stewart is a 35 y.o. female with: Not well controlled moderate persistent asthma Past history - Diagnosed with asthma in middle school and was doing well up until a few years ago.  Main triggers include allergies, weather change, infection, strong scents and perfumes.  During these flares she takes Advair 250 1 puff twice a day, Singulair 10 mg daily and albuterol as needed with good benefit. 2020 spirometry was normal. Interim history - Flared a few weeks ago and also had flu. Still not feeling back to baseline. 1 course of prednisone. Albuterol nebulizer causing shaking.  For the next week use levoalbuterol nebulizer twice a day before using Advair for the next 1 week. Daily controller medication(s): start Advair 32 2 puff twice a day with spacer and rinse mouth afterwards.  Stop Symbicort for now.   Advair HFA not covered, Airduo Digihaler not covered - sent in Advair Diskus 1 puff twice a day.  Spacer given and demonstrated proper use with inhaler. Patient understood technique and all questions/concerned were addressed.  May use levoalbuterol rescue inhaler 2 puffs every 4 to 6 hours as needed for shortness of breath, chest tightness, coughing, and wheezing. May use levoalbuterol rescue inhaler 2 puffs 5 to 15 minutes prior to strenuous physical activities. Monitor frequency of use. Get spirometry at next visit.  Seasonal and perennial allergic rhinoconjunctivitis Past history - Perennial rhinoconjunctivitis symptoms for the last 10+ years with worsening during change of season. 2020 skin testing showed: Positive to grass, weed, ragweed, trees, mold. Started AIT on 07/06/2019 (MOLD, G-W-RW-T) and  stopped due to pregnancy. Interim history - increased symptoms and wants to restart injections. No longer pregnant.   Continue environmental control measures.  Continue dymista (fluticasone + azelastine nasal spray combination) 1 spray per nostril  twice a day.  Use Atrovent (ipratropium) 0.03% 1-2 sprays per nostril twice a day as needed for runny nose/drainage.  Nasal saline spray (i.e., Simply Saline) or nasal saline lavage (i.e., NeilMed) is recommended as needed and prior to medicated nasal sprays.  Use over the counter antihistamines such as Zyrtec (cetirizine), Claritin (loratadine), Allegra (fexofenadine), or Xyzal (levocetirizine) daily as needed. May take twice a day during allergy flares. May switch antihistamines every few months.  Continue Singulair (montelukast) 10mg  daily at night.  Restart allergy injections at next visit if asthma is more stable.   Pollen-food allergy Past history - Certain raw fruits and almonds cause perioral pruritus.  Tolerates processed and cooked forms with no issues.  Patient questionably had a reaction to pistachios but it was cross contaminated almonds in the past.  She tolerates other tree nuts including cashews, walnuts, pecans and hazelnuts with no issues. 2020 skin testing was borderline positive to almonds and negative pistachios. Okay with pistachios and nectarines.  Interim history - no reactions.  Avoid fresh fruits and almonds for now.  For mild symptoms you can take over the counter antihistamines such as Benadryl and monitor symptoms closely. If symptoms worsen or if you have severe symptoms including breathing issues, throat closure, significant swelling, whole body hives, severe diarrhea and vomiting, lightheadedness then inject epinephrine and seek immediate medical care afterwards.  Dyshidrotic eczema Past history - Dyshidrotic eczema on the palms bilaterally.  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   Interim history - stable.   Use triamcinolone 0.1% ointment twice a day as needed for eczema flares. Do not use on the face, neck, armpits or groin area. Do not use more than 3 weeks in a row.   Use Eucrisa twice a day for mild flares.    Continue proper skin care.  Return in about 4 weeks (around 12/12/2020).  Meds ordered this encounter  Medications  . DISCONTD: fluticasone-salmeterol (ADVAIR HFA) 230-21 MCG/ACT inhaler    Sig: Inhale 2 puffs into the lungs 2 (two) times daily with spacer and rinse mouth afterwards.    Dispense:  12 g    Refill:  5  . levalbuterol (XOPENEX) 1.25 MG/3ML nebulizer solution    Sig: Use 19ml  (1.25 mg) by nebulizer every 4 (four) hours as needed for wheezing (coughing).    Dispense:  75 mL    Refill:  3  . ipratropium (ATROVENT) 0.03 % nasal spray    Sig: Place 1-2 sprays into both nostrils 2 (two) times daily as needed (nasal drainage).    Dispense:  30 mL    Refill:  5  . DISCONTD: Fluticasone-Salmeterol,sensor, (AIRDUO DIGIHALER) 232-14 MCG/ACT AEPB    Sig: Inhale 1 puff into the lungs in the morning and at bedtime. Rinse mouth after each use    Dispense:  1 each    Refill:  5    Patient bringing coupon.   Lab Orders  No laboratory test(s) ordered today    Diagnostics: Spirometry:  Tracings reviewed. Her effort: Good reproducible efforts. FVC: 3.76L FEV1: 3.38L, 103% predicted FEV1/FVC ratio: 90% Interpretation: Spirometry consistent with normal pattern.  Please see scanned spirometry results for details.  Medication List:  Current Outpatient Medications  Medication Sig Dispense Refill  .  albuterol (VENTOLIN HFA) 108 (90 Base) MCG/ACT inhaler Inhale 2 puffs into the lungs every 6 (six) hours as needed for wheezing or shortness of breath. 6.7 g prn  . Azelastine-Fluticasone 137-50 MCG/ACT SUSP USE 1 SPRAY INTO THE NOSE TWICE A DAY AS DIRECTED 23 g 3  . benzonatate (TESSALON) 100 MG capsule Take 1 capsule (100 mg total) by mouth 3 (three) times daily as needed for cough. 30 capsule 0  . Crisaborole (EUCRISA) 2 % OINT Apply 1 application topically 2 (two) times daily as needed. 100 g 5  . etonogestrel-ethinyl estradiol (NUVARING) 0.12-0.015 MG/24HR vaginal ring INSERT 1  RING VAGINALLY EVERY MONTH (Patient taking differently: INSERT 1 RING VAGINALLY EVERY MONTH) 3 each 3  . fexofenadine (ALLEGRA) 180 MG tablet Take 180 mg by mouth daily.    Marland Kitchen ipratropium (ATROVENT) 0.03 % nasal spray Place 1-2 sprays into both nostrils 2 (two) times daily as needed (nasal drainage). 30 mL 5  . ketotifen (ZADITOR) 0.025 % ophthalmic solution 2 drops 2 (two) times daily.    Marland Kitchen levalbuterol (XOPENEX) 1.25 MG/3ML nebulizer solution Use 67ml  (1.25 mg) by nebulizer every 4 (four) hours as needed for wheezing (coughing). 75 mL 3  . levocetirizine (XYZAL) 5 MG tablet Take 5 mg by mouth every evening.    . montelukast (SINGULAIR) 10 MG tablet TAKE 1 TABLET (10 MG TOTAL) BY MOUTH AT BEDTIME. 90 tablet 2  . Multiple Vitamin (MULTIVITAMIN PO) Take by mouth.    . sertraline (ZOLOFT) 25 MG tablet Take 25 mg by mouth daily.    Marland Kitchen triamcinolone ointment (KENALOG) 0.1 % Apply 1 application topically 2 (two) times daily. 30 g 2  . fluticasone-salmeterol (ADVAIR DISKUS) 500-50 MCG/ACT AEPB Inhale 1 puff into the lungs in the morning and at bedtime. Rinse mouth after each use. 60 each 3  . sertraline (ZOLOFT) 25 MG tablet Take 1 tablet (25 mg total) by mouth daily. 30 tablet 2   No current facility-administered medications for this visit.   Allergies: Allergies  Allergen Reactions  . Latex Rash  . Trimethoprim   . Almond (Diagnostic) Itching  . Other Itching    PT IS ALLERGIC PITTED FRUITS   I reviewed her past medical history, social history, family history, and environmental history and no significant changes have been reported from her previous visit.  Review of Systems  Constitutional: Negative for appetite change, chills, fever and unexpected weight change.  HENT: Positive for congestion, rhinorrhea and sneezing.   Eyes: Positive for itching.  Respiratory: Positive for cough and chest tightness. Negative for shortness of breath and wheezing.   Cardiovascular: Negative for chest pain.   Gastrointestinal: Negative for abdominal pain.  Genitourinary: Negative for difficulty urinating.  Skin: Positive for rash.  Allergic/Immunologic: Positive for environmental allergies and food allergies.  Neurological: Negative for headaches.   Objective: BP 126/72 (BP Location: Left Arm, Patient Position: Sitting, Cuff Size: Large)   Pulse 66   Temp 97.9 F (36.6 C) (Temporal)   Resp 16   Ht 5' 4.5" (1.638 m)   Wt 251 lb (113.9 kg)   SpO2 97%   BMI 42.42 kg/m  Body mass index is 42.42 kg/m. Physical Exam Vitals and nursing note reviewed.  Constitutional:      Appearance: Normal appearance. She is well-developed.  HENT:     Head: Normocephalic and atraumatic.     Right Ear: External ear normal.     Left Ear: External ear normal.     Nose: Nose normal.  Mouth/Throat:     Mouth: Mucous membranes are moist.     Pharynx: Oropharynx is clear.  Eyes:     Conjunctiva/sclera: Conjunctivae normal.  Cardiovascular:     Rate and Rhythm: Normal rate and regular rhythm.     Heart sounds: Normal heart sounds. No murmur heard.   Pulmonary:     Effort: Pulmonary effort is normal.     Breath sounds: Normal breath sounds. No wheezing, rhonchi or rales.  Musculoskeletal:     Cervical back: Neck supple.  Skin:    General: Skin is warm.     Findings: No rash.  Neurological:     Mental Status: She is alert and oriented to person, place, and time.  Psychiatric:        Behavior: Behavior normal.    Previous notes and tests were reviewed. The plan was reviewed with the patient/family, and all questions/concerned were addressed.  It was my pleasure to see Kristina Stewart today and participate in her care. Please feel free to contact me with any questions or concerns.  Sincerely,  Wyline MoodYoon Avalin Briley, DO Allergy & Immunology  [facility] [place] office: [phone] [facility] office: [phone]

## 2020-11-14 ENCOUNTER — Other Ambulatory Visit (HOSPITAL_COMMUNITY): Payer: Self-pay

## 2020-11-14 ENCOUNTER — Other Ambulatory Visit: Payer: Self-pay

## 2020-11-14 ENCOUNTER — Encounter: Payer: Self-pay | Admitting: Allergy

## 2020-11-14 ENCOUNTER — Ambulatory Visit (INDEPENDENT_AMBULATORY_CARE_PROVIDER_SITE_OTHER): Payer: No Typology Code available for payment source | Admitting: Allergy

## 2020-11-14 VITALS — BP 126/72 | HR 66 | Temp 97.9°F | Resp 16 | Ht 64.5 in | Wt 251.0 lb

## 2020-11-14 DIAGNOSIS — J4540 Moderate persistent asthma, uncomplicated: Secondary | ICD-10-CM | POA: Diagnosis not present

## 2020-11-14 DIAGNOSIS — J302 Other seasonal allergic rhinitis: Secondary | ICD-10-CM | POA: Diagnosis not present

## 2020-11-14 DIAGNOSIS — J3089 Other allergic rhinitis: Secondary | ICD-10-CM

## 2020-11-14 DIAGNOSIS — L301 Dyshidrosis [pompholyx]: Secondary | ICD-10-CM | POA: Diagnosis not present

## 2020-11-14 DIAGNOSIS — T781XXD Other adverse food reactions, not elsewhere classified, subsequent encounter: Secondary | ICD-10-CM

## 2020-11-14 DIAGNOSIS — J4520 Mild intermittent asthma, uncomplicated: Secondary | ICD-10-CM

## 2020-11-14 DIAGNOSIS — H1010 Acute atopic conjunctivitis, unspecified eye: Secondary | ICD-10-CM

## 2020-11-14 DIAGNOSIS — H1013 Acute atopic conjunctivitis, bilateral: Secondary | ICD-10-CM | POA: Diagnosis not present

## 2020-11-14 MED ORDER — IPRATROPIUM BROMIDE 0.03 % NA SOLN
1.0000 | Freq: Two times a day (BID) | NASAL | 5 refills | Status: DC | PRN
  Filled 2020-11-14: qty 30, 75d supply, fill #0

## 2020-11-14 MED ORDER — LEVALBUTEROL HCL 1.25 MG/3ML IN NEBU
1.2500 mg | INHALATION_SOLUTION | RESPIRATORY_TRACT | 3 refills | Status: DC | PRN
  Filled 2020-11-14: qty 75, 5d supply, fill #0

## 2020-11-14 MED ORDER — FLUTICASONE-SALMETEROL 500-50 MCG/ACT IN AEPB
1.0000 | INHALATION_SPRAY | Freq: Two times a day (BID) | RESPIRATORY_TRACT | 3 refills | Status: DC
  Filled 2020-11-14: qty 60, 30d supply, fill #0
  Filled 2020-12-09: qty 60, 30d supply, fill #1
  Filled 2021-02-13: qty 60, 30d supply, fill #2
  Filled 2021-07-01: qty 60, 30d supply, fill #3

## 2020-11-14 MED ORDER — AIRDUO DIGIHALER 232-14 MCG/ACT IN AEPB
1.0000 | INHALATION_SPRAY | Freq: Two times a day (BID) | RESPIRATORY_TRACT | 5 refills | Status: DC
  Filled 2020-11-14: qty 1, 30d supply, fill #0

## 2020-11-14 MED ORDER — ADVAIR HFA 230-21 MCG/ACT IN AERO
2.0000 | INHALATION_SPRAY | Freq: Two times a day (BID) | RESPIRATORY_TRACT | 5 refills | Status: DC
  Filled 2020-11-14: qty 12, 30d supply, fill #0

## 2020-11-14 MED ORDER — SERTRALINE HCL 25 MG PO TABS
25.0000 mg | ORAL_TABLET | Freq: Every day | ORAL | 2 refills | Status: DC
  Filled 2020-11-14: qty 30, 30d supply, fill #0
  Filled 2020-12-09: qty 30, 30d supply, fill #1
  Filled 2021-01-06: qty 30, 30d supply, fill #2

## 2020-11-14 NOTE — Patient Instructions (Addendum)
Asthma For the next week use levoalbuterol nebulizer twice a day before using Advair for the next 1 week. This medication should not make you feel as shaky - replaces albuterol nebulizer.  May use every 4-6 hours as needed additionally.  Daily controller medication(s): start Advair 2 puff twice a day with spacer and rinse mouth afterwards.  Stop Symbicort for now.  Spacer given and demonstrated proper use with inhaler. Patient understood technique and all questions/concerned were addressed.  May use levoalbuterol rescue inhaler 2 puffs every 4 to 6 hours as needed for shortness of breath, chest tightness, coughing, and wheezing. May use levoalbuterol rescue inhaler 2 puffs 5 to 15 minutes prior to strenuous physical activities. Monitor frequency of use.  Asthma control goals:  Full participation in all desired activities (may need albuterol before activity) Albuterol use two times or less a week on average (not counting use with activity) Cough interfering with sleep two times or less a month Oral steroids no more than once a year No hospitalizations  Allergic conjunctivitis Past skin testing showed: Positive to grass, weed, ragweed, trees, mold.   Continue environmental control measures.  Continue dymista (fluticasone + azelastine nasal spray combination) 1 spray per nostril twice a day.  Use Atrovent (ipratropium) 0.03% 1-2 sprays per nostril twice a day as needed for runny nose/drainage.  Nasal saline spray (i.e., Simply Saline) or nasal saline lavage (i.e., NeilMed) is recommended as needed and prior to medicated nasal sprays.  Use over the counter antihistamines such as Zyrtec (cetirizine), Claritin (loratadine), Allegra (fexofenadine), or Xyzal (levocetirizine) daily as needed. May take twice a day during allergy flares. May switch antihistamines every few months.  Continue Singulair (montelukast) 10mg  daily at night.  REStart allergy injections at next visit if asthma is  more stable.   Pollen-food allergy  Avoid fresh fruits and almonds for now.  For mild symptoms you can take over the counter antihistamines such as Benadryl and monitor symptoms closely. If symptoms worsen or if you have severe symptoms including breathing issues, throat closure, significant swelling, whole body hives, severe diarrhea and vomiting, lightheadedness then inject epinephrine and seek immediate medical care afterwards.  Dyshidrotic eczema  Use triamcinolone 0.1% ointment twice a day as needed for eczema flares. Do not use on the face, neck, armpits or groin area. Do not use more than 3 weeks in a row.   Use Eucrisa twice a day for mild flares.   Continue proper skin care.  Follow up in 1 months or sooner if needed.

## 2020-11-14 NOTE — Assessment & Plan Note (Signed)
Past history - Dyshidrotic eczema on the palms bilaterally.  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   Interim history - stable.   Use triamcinolone 0.1% ointment twice a day as needed for eczema flares. Do not use on the face, neck, armpits or groin area. Do not use more than 3 weeks in a row.   Use Eucrisa twice a day for mild flares.   Continue proper skin care.

## 2020-11-14 NOTE — Assessment & Plan Note (Signed)
Past history - Perennial rhinoconjunctivitis symptoms for the last 10+ years with worsening during change of season. 2020 skin testing showed: Positive to grass, weed, ragweed, trees, mold. Started AIT on 07/06/2019 (MOLD, G-W-RW-T) and stopped due to pregnancy. Interim history - increased symptoms and wants to restart injections. No longer pregnant.   Continue environmental control measures.  Continue dymista (fluticasone + azelastine nasal spray combination) 1 spray per nostril twice a day.  Use Atrovent (ipratropium) 0.03% 1-2 sprays per nostril twice a day as needed for runny nose/drainage.  Nasal saline spray (i.e., Simply Saline) or nasal saline lavage (i.e., NeilMed) is recommended as needed and prior to medicated nasal sprays.  Use over the counter antihistamines such as Zyrtec (cetirizine), Claritin (loratadine), Allegra (fexofenadine), or Xyzal (levocetirizine) daily as needed. May take twice a day during allergy flares. May switch antihistamines every few months.  Continue Singulair (montelukast) 10mg  daily at night.  Restart allergy injections at next visit if asthma is more stable.

## 2020-11-14 NOTE — Assessment & Plan Note (Signed)
Past history - Diagnosed with asthma in middle school and was doing well up until a few years ago.  Main triggers include allergies, weather change, infection, strong scents and perfumes.  During these flares she takes Advair 250 1 puff twice a day, Singulair 10 mg daily and albuterol as needed with good benefit. 2020 spirometry was normal. Interim history - Flared a few weeks ago and also had flu. Still not feeling back to baseline. 1 course of prednisone. Albuterol nebulizer causing shaking.  For the next week use levoalbuterol nebulizer twice a day before using Advair for the next 1 week. Daily controller medication(s): start Advair 2 puff twice a day with spacer and rinse mouth afterwards.  Stop Symbicort for now.   Advair HFA not covered, Airduo Digihaler not covered - sent in Advair Diskus 1 puff twice a day.  Spacer given and demonstrated proper use with inhaler. Patient understood technique and all questions/concerned were addressed.  May use levoalbuterol rescue inhaler 2 puffs every 4 to 6 hours as needed for shortness of breath, chest tightness, coughing, and wheezing. May use levoalbuterol rescue inhaler 2 puffs 5 to 15 minutes prior to strenuous physical activities. Monitor frequency of use. Get spirometry at next visit.

## 2020-11-14 NOTE — Assessment & Plan Note (Signed)
Past history - Certain raw fruits and almonds cause perioral pruritus.  Tolerates processed and cooked forms with no issues.  Patient questionably had a reaction to pistachios but it was cross contaminated almonds in the past.  She tolerates other tree nuts including cashews, walnuts, pecans and hazelnuts with no issues. 2020 skin testing was borderline positive to almonds and negative pistachios. Okay with pistachios and nectarines.  Interim history - no reactions.  Avoid fresh fruits and almonds for now.  For mild symptoms you can take over the counter antihistamines such as Benadryl and monitor symptoms closely. If symptoms worsen or if you have severe symptoms including breathing issues, throat closure, significant swelling, whole body hives, severe diarrhea and vomiting, lightheadedness then inject epinephrine and seek immediate medical care afterwards.

## 2020-11-15 ENCOUNTER — Other Ambulatory Visit (HOSPITAL_COMMUNITY): Payer: Self-pay

## 2020-11-18 DIAGNOSIS — J302 Other seasonal allergic rhinitis: Secondary | ICD-10-CM | POA: Diagnosis not present

## 2020-11-18 NOTE — Progress Notes (Signed)
VIALS EXP 11-18-21 

## 2020-11-19 DIAGNOSIS — J301 Allergic rhinitis due to pollen: Secondary | ICD-10-CM | POA: Diagnosis not present

## 2020-12-10 ENCOUNTER — Other Ambulatory Visit (HOSPITAL_COMMUNITY): Payer: Self-pay

## 2020-12-11 ENCOUNTER — Other Ambulatory Visit (HOSPITAL_COMMUNITY): Payer: Self-pay

## 2020-12-20 ENCOUNTER — Ambulatory Visit: Payer: No Typology Code available for payment source | Admitting: Allergy

## 2020-12-31 ENCOUNTER — Telehealth: Payer: No Typology Code available for payment source | Admitting: Physician Assistant

## 2020-12-31 DIAGNOSIS — J028 Acute pharyngitis due to other specified organisms: Secondary | ICD-10-CM

## 2020-12-31 DIAGNOSIS — B9689 Other specified bacterial agents as the cause of diseases classified elsewhere: Secondary | ICD-10-CM | POA: Diagnosis not present

## 2020-12-31 MED ORDER — AMOXICILLIN 500 MG PO CAPS: 500.0000 mg | ORAL_CAPSULE | Freq: Two times a day (BID) | ORAL | 0 refills | Status: AC

## 2020-12-31 NOTE — Progress Notes (Signed)
I have spent 5 minutes in review of e-visit questionnaire, review and updating patient chart, medical decision making and response to patient.   Juletta Berhe Cody Anthonio Mizzell, PA-C    

## 2020-12-31 NOTE — Progress Notes (Signed)

## 2021-01-06 MED FILL — Azelastine HCl-Fluticasone Prop Nasal Spray 137-50 MCG/ACT: NASAL | 30 days supply | Qty: 23 | Fill #1 | Status: AC

## 2021-01-07 ENCOUNTER — Other Ambulatory Visit (HOSPITAL_COMMUNITY): Payer: Self-pay

## 2021-01-15 ENCOUNTER — Other Ambulatory Visit: Payer: Self-pay | Admitting: Internal Medicine

## 2021-01-15 ENCOUNTER — Other Ambulatory Visit (HOSPITAL_COMMUNITY): Payer: Self-pay

## 2021-01-15 MED ORDER — MONTELUKAST SODIUM 10 MG PO TABS
10.0000 mg | ORAL_TABLET | Freq: Every day | ORAL | 2 refills | Status: DC
  Filled 2021-01-15: qty 90, 90d supply, fill #0
  Filled 2021-04-14: qty 90, 90d supply, fill #1
  Filled 2021-05-13 – 2021-07-18 (×3): qty 90, 90d supply, fill #2

## 2021-01-15 MED FILL — Etonogestrel-Ethinyl Estradiol VA Ring 0.12-0.015 MG/24HR: VAGINAL | 84 days supply | Qty: 3 | Fill #1 | Status: AC

## 2021-01-20 ENCOUNTER — Other Ambulatory Visit (HOSPITAL_COMMUNITY): Payer: Self-pay

## 2021-02-01 ENCOUNTER — Telehealth: Payer: No Typology Code available for payment source | Admitting: Nurse Practitioner

## 2021-02-01 ENCOUNTER — Other Ambulatory Visit (HOSPITAL_COMMUNITY): Payer: Self-pay

## 2021-02-01 DIAGNOSIS — J0140 Acute pansinusitis, unspecified: Secondary | ICD-10-CM

## 2021-02-01 MED ORDER — AMOXICILLIN-POT CLAVULANATE 875-125 MG PO TABS
1.0000 | ORAL_TABLET | Freq: Two times a day (BID) | ORAL | 0 refills | Status: AC
  Filled 2021-02-01: qty 14, 7d supply, fill #0

## 2021-02-01 NOTE — Progress Notes (Signed)
E-Visit for Sinus Problems  We are sorry that you are not feeling well.  Here is how we plan to help!  Based on what you have shared with me it looks like you have sinusitis.  Sinusitis is inflammation and infection in the sinus cavities of the head.  Based on your presentation I believe you most likely have Acute Bacterial Sinusitis.  This is an infection caused by bacteria and is treated with antibiotics. I have prescribed Augmentin 875mg /125mg  one tablet twice daily with food, for 7 days.   Avoiding decongestants like Mucinex D or DM will help avoid raising your blood pressure.  You may use use plain Mucinex or Coricidin over the counter those are both safe for blood pressure.   Saline nasal spray help and can safely be used as often as needed for congestion.  If you develop worsening sinus pain, fever or notice severe headache and vision changes, or if symptoms are not better after completion of antibiotic, please schedule an appointment with a health care provider.    Sinus infections are not as easily transmitted as other respiratory infection, however we still recommend that you avoid close contact with loved ones, especially the very young and elderly.  Remember to wash your hands thoroughly throughout the day as this is the number one way to prevent the spread of infection!  Home Care: Only take medications as instructed by your medical team. Complete the entire course of an antibiotic. Do not take these medications with alcohol. A steam or ultrasonic humidifier can help congestion.  You can place a towel over your head and breathe in the steam from hot water coming from a faucet. Avoid close contacts especially the very young and the elderly. Cover your mouth when you cough or sneeze. Always remember to wash your hands.  Get Help Right Away If: You develop worsening fever or sinus pain. You develop a severe head ache or visual changes. Your symptoms persist after you have completed  your treatment plan.  Make sure you Understand these instructions. Will watch your condition. Will get help right away if you are not doing well or get worse.  Thank you for choosing an e-visit.  Your e-visit answers were reviewed by a board certified advanced clinical practitioner to complete your personal care plan. Depending upon the condition, your plan could have included both over the counter or prescription medications.  Please review your pharmacy choice. Make sure the pharmacy is open so you can pick up prescription now. If there is a problem, you may contact your provider through and have the prescription routed to another pharmacy.  Your safety is important to [employer]. If you have drug allergies check your prescription carefully.   For the next 24 hours you can use MyChart to ask questions about today's visit, request a non-urgent call back, or ask for a work or school excuse. You will get an email in the next two days asking about your experience. I hope that your e-visit has been valuable and will speed your recovery.   I spent approximately 7 minutes reviewing the patient's history, current symptoms and coordinating their plan of care today.    Meds ordered this encounter  Medications   amoxicillin-clavulanate (AUGMENTIN) 875-125 MG tablet    Sig: Take 1 tablet by mouth 2 (two) times daily for 7 days. Take with food    Dispense:  14 tablet    Refill:  0

## 2021-02-13 ENCOUNTER — Other Ambulatory Visit (HOSPITAL_COMMUNITY): Payer: Self-pay

## 2021-02-13 MED ORDER — SERTRALINE HCL 25 MG PO TABS
25.0000 mg | ORAL_TABLET | Freq: Every day | ORAL | 2 refills | Status: DC
  Filled 2021-02-13: qty 30, 30d supply, fill #0
  Filled 2021-03-10: qty 30, 30d supply, fill #1
  Filled 2021-04-14: qty 30, 30d supply, fill #2

## 2021-02-13 MED FILL — Etonogestrel-Ethinyl Estradiol VA Ring 0.12-0.015 MG/24HR: VAGINAL | 84 days supply | Qty: 3 | Fill #2 | Status: CN

## 2021-02-27 ENCOUNTER — Other Ambulatory Visit: Payer: Self-pay

## 2021-02-27 ENCOUNTER — Other Ambulatory Visit (HOSPITAL_COMMUNITY): Payer: Self-pay

## 2021-02-27 ENCOUNTER — Encounter: Payer: Self-pay | Admitting: Allergy

## 2021-02-27 ENCOUNTER — Ambulatory Visit (INDEPENDENT_AMBULATORY_CARE_PROVIDER_SITE_OTHER): Payer: No Typology Code available for payment source | Admitting: Allergy

## 2021-02-27 DIAGNOSIS — T781XXD Other adverse food reactions, not elsewhere classified, subsequent encounter: Secondary | ICD-10-CM

## 2021-02-27 DIAGNOSIS — H1013 Acute atopic conjunctivitis, bilateral: Secondary | ICD-10-CM

## 2021-02-27 DIAGNOSIS — J302 Other seasonal allergic rhinitis: Secondary | ICD-10-CM | POA: Diagnosis not present

## 2021-02-27 DIAGNOSIS — J3089 Other allergic rhinitis: Secondary | ICD-10-CM

## 2021-02-27 DIAGNOSIS — L301 Dyshidrosis [pompholyx]: Secondary | ICD-10-CM

## 2021-02-27 DIAGNOSIS — J4540 Moderate persistent asthma, uncomplicated: Secondary | ICD-10-CM | POA: Diagnosis not present

## 2021-02-27 DIAGNOSIS — Z8709 Personal history of other diseases of the respiratory system: Secondary | ICD-10-CM

## 2021-02-27 DIAGNOSIS — H1010 Acute atopic conjunctivitis, unspecified eye: Secondary | ICD-10-CM

## 2021-02-27 MED ORDER — IPRATROPIUM-ALBUTEROL 0.5-2.5 (3) MG/3ML IN SOLN
3.0000 mL | RESPIRATORY_TRACT | 2 refills | Status: DC | PRN
  Filled 2021-02-27 – 2021-04-14 (×2): qty 180, 10d supply, fill #0

## 2021-02-27 MED ORDER — ALBUTEROL SULFATE (2.5 MG/3ML) 0.083% IN NEBU
2.5000 mg | INHALATION_SOLUTION | Freq: Four times a day (QID) | RESPIRATORY_TRACT | 2 refills | Status: AC | PRN
  Filled 2021-02-27 – 2021-04-14 (×2): qty 75, 7d supply, fill #0

## 2021-02-27 NOTE — Assessment & Plan Note (Signed)
Past history - Certain raw fruits and almonds cause perioral pruritus.  Tolerates processed and cooked forms with no issues.  Patient questionably had a reaction to pistachios but it was cross contaminated almonds in the past.  She tolerates other tree nuts including cashews, walnuts, pecans and hazelnuts with no issues. 2020 skin testing was borderline positive to almonds and negative pistachios. Okay with pistachios and nectarines.  Interim history - eating fresh fruits with no issues.   May try almonds at home in little doses.  For mild symptoms you can take over the counter antihistamines such as Benadryl and monitor symptoms closely. If symptoms worsen or if you have severe symptoms including breathing issues, throat closure, significant swelling, whole body hives, severe diarrhea and vomiting, lightheadedness then inject epinephrine and seek immediate medical care afterwards.

## 2021-02-27 NOTE — Assessment & Plan Note (Signed)
Past history - Dyshidrotic eczema on the palms bilaterally.  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   Interim history - flared after using a new lotion.  Use triamcinolone 0.1% ointment twice a day as needed for eczema flares. Do not use on the face, neck, armpits or groin area. Do not use more than 3 weeks in a row.   Use Eucrisa twice a day for mild flares.   Continue proper skin care.

## 2021-02-27 NOTE — Assessment & Plan Note (Signed)
4 sinus infections per year. Wants to know if she needs a pneumonia vaccine. Marland Kitchen Keep track of infections and antibiotics use. . Get bloodwork to look at immune system.

## 2021-02-27 NOTE — Patient Instructions (Addendum)
Asthma Daily controller medication(s): Advair 1 puff twice a day and rinse mouth after each use. May use albuterol rescue inhaler 2 puffs or nebulizer every 4 to 6 hours as needed for shortness of breath, chest tightness, coughing, and wheezing. May use albuterol rescue inhaler 2 puffs 5 to 15 minutes prior to strenuous physical activities. Monitor frequency of use.  Asthma control goals:  Full participation in all desired activities (may need albuterol before activity) Albuterol use two times or less a week on average (not counting use with activity) Cough interfering with sleep two times or less a month Oral steroids no more than once a year No hospitalizations    Allergic conjunctivitis Past skin testing showed: Positive to grass, weed, ragweed, trees, mold.  Continue environmental control measures.  Continue dymista (fluticasone + azelastine nasal spray combination) 1 spray per nostril twice a day. Use Atrovent (ipratropium) 0.03% 1-2 sprays per nostril twice a day as needed for runny nose/drainage. Nasal saline spray (i.e., Simply Saline) or nasal saline lavage (i.e., NeilMed) is recommended as needed and prior to medicated nasal sprays. Use over the counter antihistamines such as Zyrtec (cetirizine), Claritin (loratadine), Allegra (fexofenadine), or Xyzal (levocetirizine) daily as needed. May take twice a day during allergy flares. May switch antihistamines every few months. Continue Singulair (montelukast) 10mg  daily at night. Restart allergy injections at our [facility] office - schedule for first injection of new vial.   Pollen-food allergy May try almonds at home in little doses. For mild symptoms you can take over the counter antihistamines such as Benadryl and monitor symptoms closely. If symptoms worsen or if you have severe symptoms including breathing issues, throat closure, significant swelling, whole body hives, severe diarrhea and vomiting, lightheadedness then inject  epinephrine and seek immediate medical care afterwards.   Dyshidrotic eczema Use triamcinolone 0.1% ointment twice a day as needed for eczema flares. Do not use on the face, neck, armpits or groin area. Do not use more than 3 weeks in a row.  Use Eucrisa twice a day for mild flares.  Continue proper skin care.  Infections Keep track of infections and antibiotics use. Get bloodwork We are ordering labs, so please allow 1-2 weeks for the results to come back. With the newly implemented Cures Act, the labs might be visible to you at the same time that they become visible to me. However, I will not address the results until all of the results are back, so please be patient.   Follow up in 4 months or sooner if needed.

## 2021-02-27 NOTE — Assessment & Plan Note (Signed)
Past history - Perennial rhinoconjunctivitis symptoms for the last 10+ years with worsening during change of season. 2020 skin testing showed: Positive to grass, weed, ragweed, trees, mold. Started AIT on 07/06/2019 (MOLD, G-W-RW-T) and stopped due to pregnancy. Interim history - doing well with below regimen. Wants to restart injections at [facility] office.  Continue environmental control measures.  . Continue dymista (fluticasone + azelastine nasal spray combination) 1 spray per nostril twice a day.  Use Atrovent (ipratropium) 0.03% 1-2 sprays per nostril twice a day as needed for runny nose/drainage.  Nasal saline spray (i.e., Simply Saline) or nasal saline lavage (i.e., NeilMed) is recommended as needed and prior to medicated nasal sprays.  Use over the counter antihistamines such as Zyrtec (cetirizine), Claritin (loratadine), Allegra (fexofenadine), or Xyzal (levocetirizine) daily as needed. May take twice a day during allergy flares. May switch antihistamines every few months.  Continue Singulair (montelukast) 10mg  daily at night.  Restart allergy injections at our [facility] office - schedule for first injection of new vial.

## 2021-02-27 NOTE — Progress Notes (Signed)
Follow Up Note  RE: Kristina Stewart MRN: [id number] DOB: May 28, 1986 Date of Office Visit: 02/27/2021  Referring provider: Margaree Mackintosh, MD Primary care provider: Margaree Mackintosh, MD  Chief Complaint: Asthma and Allergic Rhinitis   History of Present Illness: I had the pleasure of seeing Kristina Stewart for a follow up visit at the [facility] on 02/27/2021. She is a 35 y.o. female, who is being followed for asthma, allergic rhinoconjunctivitis, oral allergy syndrome and dyshidrotic eczema. Her previous allergy office visit was on 11/14/2020 with Dr. Selena Batten. Today is a regular follow up visit.  Asthma: Currently on Advair Diskus 1 puff twice a day and rinsing mouth after each use.  Had some URI symptoms from her baby which triggered her asthma. Patient has a portable nebulizer now and used albuterol and duoneb with good benefit.   She did not tolerate xopenex nebulizer - caused jitteriness and shaking.  Denies any ER/urgent care visits or prednisone use since the last visit. Patient feels chest tightness if misses her Advair dose.  Seasonal and perennial allergic rhinoconjunctivitis Currently on dymista BID, Singulair QHS, allegra QAM and Xyzal QPM with good benefit. Only using Atrovent nasal spray as needed with good benefit.  Interested in re-starting injections at our [place] office.    Pollen-food allergy No reactions with eating fresh fruits.   Dyshidrotic eczema Waxes and wanes. She thinks the Cerave lotion flared this episode. Used Eucrisa with good benefit in the past.  Infections Usually has 4 sinus infections per year. While she was hospitalized she was asked about getting a pneumonia shot and she wasn't sure if she should get it or not.  Assessment and Plan: Kristina Stewart is a 35 y.o. female with: Moderate persistent asthma without complication Past history - Diagnosed with asthma in middle school and was doing well up until a few years ago.   Main triggers include allergies, weather change, infection, strong scents and perfumes.  During these flares she takes Advair 250 1 puff twice a day, Singulair 10 mg daily and albuterol as needed with good benefit. 2020 spirometry was normal. Interim history - did not tolerate xopenex neb. Doing well with Advair 1 puff twice a day. Act score 17. Today's spirometry was normal. Daily controller medication(s): Advair 1 puff twice a day and rinse mouth after each use. Continue Singulair (montelukast) 10mg  daily at night. May use albuterol rescue inhaler 2 puffs or nebulizer every 4 to 6 hours as needed for shortness of breath, chest tightness, coughing, and wheezing. May use albuterol rescue inhaler 2 puffs 5 to 15 minutes prior to strenuous physical activities. Monitor frequency of use.  Get spirometry at next visit.  Seasonal and perennial allergic rhinoconjunctivitis Past history - Perennial rhinoconjunctivitis symptoms for the last 10+ years with worsening during change of season. 2020 skin testing showed: Positive to grass, weed, ragweed, trees, mold. Started AIT on 07/06/2019 (MOLD, G-W-RW-T) and stopped due to pregnancy. Interim history - doing well with below regimen. Wants to restart injections at [facility] office. Continue environmental control measures.  Continue dymista (fluticasone + azelastine nasal spray combination) 1 spray per nostril twice a day. Use Atrovent (ipratropium) 0.03% 1-2 sprays per nostril twice a day as needed for runny nose/drainage. Nasal saline spray (i.e., Simply Saline) or nasal saline lavage (i.e., NeilMed) is recommended as needed and prior to medicated nasal sprays. Use over the counter antihistamines such as Zyrtec (cetirizine), Claritin (loratadine), Allegra (fexofenadine), or Xyzal (levocetirizine) daily as  needed. May take twice a day during allergy flares. May switch antihistamines every few months. Continue Singulair (montelukast) 10mg  daily at  night. [facility] - schedule for first injection of new vial.  Pollen-food allergy Past history - Certain raw fruits and almonds cause perioral pruritus.  Tolerates processed and cooked forms with no issues.  Patient questionably had a reaction to pistachios but it was cross contaminated almonds in the past.  She tolerates other tree nuts including cashews, walnuts, pecans and hazelnuts with no issues. 2020 skin testing was borderline positive to almonds and negative pistachios. Okay with pistachios and nectarines.  Interim history - eating fresh fruits with no issues.  May try almonds at home in little doses. For mild symptoms you can take over the counter antihistamines such as Benadryl and monitor symptoms closely. If symptoms worsen or if you have severe symptoms including breathing issues, throat closure, significant swelling, whole body hives, severe diarrhea and vomiting, lightheadedness then inject epinephrine and seek immediate medical care afterwards.  Dyshidrotic eczema Past history - Dyshidrotic eczema on the palms bilaterally.  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   Interim history - flared after using a new lotion. Use triamcinolone 0.1% ointment twice a day as needed for eczema flares. Do not use on the face, neck, armpits or groin area. Do not use more than 3 weeks in a row.  Use Eucrisa twice a day for mild flares.  Continue proper skin care.  History of frequent upper respiratory infection 4 sinus infections per year. Wants to know if she needs a pneumonia vaccine. Keep track of infections and antibiotics use. Get bloodwork to look at immune system.  Return in about 4 months (around 06/29/2021).  Meds ordered this encounter  Medications   albuterol (PROVENTIL) (2.5 MG/3ML) 0.083% nebulizer solution    Sig: Take 3 mLs (2.5 mg total) by nebulization every 6 (six) hours as needed for  wheezing or shortness of breath (coughing fits).    Dispense:  75 mL    Refill:  2   ipratropium-albuterol (DUONEB) 0.5-2.5 (3) MG/3ML SOLN    Sig: Take 3 mLs by nebulization every 4 (four) hours as needed (coughing fits, shortness of breath, wheezing).    Dispense:  180 mL    Refill:  2   Lab Orders         CBC with Differential/Platelet         Complement, total         IgG, IgA, IgM         Strep pneumoniae 23 Serotypes IgG         Diphtheria / Tetanus Antibody Panel      Diagnostics: Spirometry:  Tracings reviewed. Her effort: Good reproducible efforts. FVC: 4.66L FEV1: 4.08L, 126% predicted FEV1/FVC ratio: 88% Interpretation: Spirometry consistent with normal pattern.  Please see scanned spirometry results for details.  Medication List:  Current Outpatient Medications  Medication Sig Dispense Refill   albuterol (PROVENTIL) (2.5 MG/3ML) 0.083% nebulizer solution Take 3 mLs (2.5 mg total) by nebulization every 6 (six) hours as needed for wheezing or shortness of breath (coughing fits). 75 mL 2   albuterol (VENTOLIN HFA) 108 (90 Base) MCG/ACT inhaler Inhale 2 puffs into the lungs every 6 (six) hours as needed for wheezing or shortness of breath. 6.7 g prn   Azelastine-Fluticasone 137-50 MCG/ACT SUSP USE 1 SPRAY INTO EACH NOSTRIL TWICE A DAY AS DIRECTED 23 g 3  Crisaborole (EUCRISA) 2 % OINT Apply 1 application topically 2 (two) times daily as needed. 100 g 5   etonogestrel-ethinyl estradiol (NUVARING) 0.12-0.015 MG/24HR vaginal ring INSERT 1 RING VAGINALLY EVERY MONTH (Patient taking differently: INSERT 1 RING VAGINALLY EVERY MONTH) 3 each 3   fexofenadine (ALLEGRA) 180 MG tablet Take 180 mg by mouth daily.     fluticasone-salmeterol (ADVAIR DISKUS) 500-50 MCG/ACT AEPB Inhale 1 puff into the lungs in the morning and at bedtime. Rinse mouth after each use. 60 each 3   ipratropium (ATROVENT) 0.03 % nasal spray Place 1-2 sprays into both nostrils 2 (two) times daily as needed  (nasal drainage). 30 mL 5   ipratropium-albuterol (DUONEB) 0.5-2.5 (3) MG/3ML SOLN Take 3 mLs by nebulization every 4 (four) hours as needed (coughing fits, shortness of breath, wheezing). 180 mL 2   ketotifen (ZADITOR) 0.025 % ophthalmic solution 2 drops 2 (two) times daily.     levocetirizine (XYZAL) 5 MG tablet Take 5 mg by mouth every evening.     montelukast (SINGULAIR) 10 MG tablet Take 1 tablet (10 mg total) by mouth at bedtime. 90 tablet 2   Multiple Vitamin (MULTIVITAMIN PO) Take by mouth.     sertraline (ZOLOFT) 25 MG tablet Take 1 tablet (25 mg total) by mouth daily. 30 tablet 2   triamcinolone ointment (KENALOG) 0.1 % Apply 1 application topically 2 (two) times daily. 30 g 2   sertraline (ZOLOFT) 25 MG tablet Take 25 mg by mouth daily. (Patient not taking: Reported on 02/27/2021)     No current facility-administered medications for this visit.   Allergies: Allergies  Allergen Reactions   Latex Rash   Trimethoprim    Almond (Diagnostic) Itching   Other Itching    PT IS ALLERGIC PITTED FRUITS   I reviewed her past medical history, social history, family history, and environmental history and no significant changes have been reported from her previous visit.  Review of Systems  Constitutional:  Negative for appetite change, chills, fever and unexpected weight change.  HENT:  Negative for congestion, rhinorrhea and sneezing.   Eyes:  Negative for itching.  Respiratory:  Negative for cough, chest tightness, shortness of breath and wheezing.   Cardiovascular:  Negative for chest pain.  Gastrointestinal:  Negative for abdominal pain.  Genitourinary:  Negative for difficulty urinating.  Skin:  Positive for rash.  Allergic/Immunologic: Positive for environmental allergies and food allergies.  Neurological:  Negative for headaches.   Objective: There were no vitals taken for this visit. There is no height or weight on file to calculate BMI. Physical Exam Vitals and nursing note  reviewed.  Constitutional:      Appearance: Normal appearance. She is well-developed.  HENT:     Head: Normocephalic and atraumatic.     Right Ear: Tympanic membrane and external ear normal.     Left Ear: Tympanic membrane and external ear normal.     Nose: Nose normal.     Mouth/Throat:     Mouth: Mucous membranes are moist.     Pharynx: Oropharynx is clear.  Eyes:     Conjunctiva/sclera: Conjunctivae normal.  Cardiovascular:     Rate and Rhythm: Normal rate and regular rhythm.     Heart sounds: Normal heart sounds. No murmur heard. Pulmonary:     Effort: Pulmonary effort is normal.     Breath sounds: Normal breath sounds. No wheezing, rhonchi or rales.  Musculoskeletal:     Cervical back: Neck supple.  Skin:    General:  Skin is warm.     Findings: Rash present.     Comments: Very dry hands b/l.  Neurological:     Mental Status: She is alert and oriented to person, place, and time.  Psychiatric:        Behavior: Behavior normal.   Previous notes and tests were reviewed. The plan was reviewed with the patient/family, and all questions/concerned were addressed.  It was my pleasure to see Kindel today and participate in her care. Please feel free to contact me with any questions or concerns.  Sincerely,  Wyline Mood, DO Allergy & Immunology  [facility] office: [phone] [facility] office: [phone]

## 2021-02-27 NOTE — Assessment & Plan Note (Signed)
Past history - Diagnosed with asthma in middle school and was doing well up until a few years ago.  Main triggers include allergies, weather change, infection, strong scents and perfumes.  During these flares she takes Advair 250 1 puff twice a day, Singulair 10 mg daily and albuterol as needed with good benefit. 2020 spirometry was normal. Interim history - did not tolerate xopenex neb. Doing well with Advair 1 puff twice a day.  Act score 17.  Today's spirometry was normal. . Daily controller medication(s): Advair 1 puff twice a day and rinse mouth after each use. o Continue Singulair (montelukast) 10mg  daily at night. . May use albuterol rescue inhaler 2 puffs or nebulizer every 4 to 6 hours as needed for shortness of breath, chest tightness, coughing, and wheezing. May use albuterol rescue inhaler 2 puffs 5 to 15 minutes prior to strenuous physical activities. Monitor frequency of use.  Get spirometry at next visit.

## 2021-03-07 ENCOUNTER — Other Ambulatory Visit (HOSPITAL_COMMUNITY): Payer: Self-pay

## 2021-03-10 ENCOUNTER — Other Ambulatory Visit (HOSPITAL_COMMUNITY): Payer: Self-pay

## 2021-03-10 MED FILL — Azelastine HCl-Fluticasone Prop Nasal Spray 137-50 MCG/ACT: NASAL | 30 days supply | Qty: 23 | Fill #2 | Status: AC

## 2021-03-12 ENCOUNTER — Ambulatory Visit (INDEPENDENT_AMBULATORY_CARE_PROVIDER_SITE_OTHER): Payer: No Typology Code available for payment source

## 2021-03-12 ENCOUNTER — Other Ambulatory Visit: Payer: Self-pay

## 2021-03-12 DIAGNOSIS — J309 Allergic rhinitis, unspecified: Secondary | ICD-10-CM | POA: Diagnosis not present

## 2021-03-12 NOTE — Progress Notes (Signed)
Patient came in today and restarted her immunotherapy injections. She was given Mold in her left arm and in her right are she received Grass, weeds, trees, and ragweed's. Patient resigned her consent form. Patient expressed that she already had a Epipen. Patient did not stay as she was returning back to work. Patient expressed that she would contact us if any problems were to arise. Patient expressed that she did immunotherapy over a year ago before she was pregnant and decoded to haut her injections during her pregnancy.

## 2021-03-21 ENCOUNTER — Ambulatory Visit (INDEPENDENT_AMBULATORY_CARE_PROVIDER_SITE_OTHER): Payer: No Typology Code available for payment source

## 2021-03-21 DIAGNOSIS — J309 Allergic rhinitis, unspecified: Secondary | ICD-10-CM | POA: Diagnosis not present

## 2021-03-24 ENCOUNTER — Encounter: Payer: Self-pay | Admitting: Internal Medicine

## 2021-03-24 ENCOUNTER — Ambulatory Visit (INDEPENDENT_AMBULATORY_CARE_PROVIDER_SITE_OTHER): Payer: No Typology Code available for payment source | Admitting: Internal Medicine

## 2021-03-24 ENCOUNTER — Other Ambulatory Visit: Payer: Self-pay

## 2021-03-24 VITALS — BP 122/76 | HR 72 | Temp 98.1°F | Resp 16 | Ht 64.5 in | Wt 240.0 lb

## 2021-03-24 DIAGNOSIS — D500 Iron deficiency anemia secondary to blood loss (chronic): Secondary | ICD-10-CM | POA: Diagnosis not present

## 2021-03-24 DIAGNOSIS — S66919A Strain of unspecified muscle, fascia and tendon at wrist and hand level, unspecified hand, initial encounter: Secondary | ICD-10-CM

## 2021-03-24 DIAGNOSIS — Z0000 Encounter for general adult medical examination without abnormal findings: Secondary | ICD-10-CM

## 2021-03-24 DIAGNOSIS — Z0001 Encounter for general adult medical examination with abnormal findings: Secondary | ICD-10-CM

## 2021-03-24 LAB — CBC WITH DIFFERENTIAL/PLATELET
Basophils Absolute: 0.1 10*3/uL (ref 0.0–0.1)
Basophils Relative: 0.8 % (ref 0.0–3.0)
Eosinophils Absolute: 0.1 10*3/uL (ref 0.0–0.7)
Eosinophils Relative: 1.2 % (ref 0.0–5.0)
HCT: 40.3 % (ref 36.0–46.0)
Hemoglobin: 13.1 g/dL (ref 12.0–15.0)
Lymphocytes Relative: 24.3 % (ref 12.0–46.0)
Lymphs Abs: 2.3 10*3/uL (ref 0.7–4.0)
MCHC: 32.6 g/dL (ref 30.0–36.0)
MCV: 82.5 fl (ref 78.0–100.0)
Monocytes Absolute: 0.4 10*3/uL (ref 0.1–1.0)
Monocytes Relative: 4.7 % (ref 3.0–12.0)
Neutro Abs: 6.6 10*3/uL (ref 1.4–7.7)
Neutrophils Relative %: 69 % (ref 43.0–77.0)
Platelets: 270 10*3/uL (ref 150.0–400.0)
RBC: 4.89 Mil/uL (ref 3.87–5.11)
RDW: 14.3 % (ref 11.5–15.5)
WBC: 9.6 10*3/uL (ref 4.0–10.5)

## 2021-03-24 LAB — IBC + FERRITIN
Ferritin: 53.1 ng/mL (ref 10.0–291.0)
Iron: 61 ug/dL (ref 42–145)
Saturation Ratios: 15.5 % — ABNORMAL LOW (ref 20.0–50.0)
TIBC: 393.4 ug/dL (ref 250.0–450.0)
Transferrin: 281 mg/dL (ref 212.0–360.0)

## 2021-03-24 NOTE — Patient Instructions (Signed)
Health Maintenance, Female Adopting a healthy lifestyle and getting preventive care are important in promoting health and wellness. Ask your health care provider about: The right schedule for you to have regular tests and exams. Things you can do on your own to prevent diseases and keep yourself healthy. What should I know about diet, weight, and exercise? Eat a healthy diet  Eat a diet that includes plenty of vegetables, fruits, low-fat dairy products, and lean protein. Do not eat a lot of foods that are high in solid fats, added sugars, or sodium. Maintain a healthy weight Body mass index (BMI) is used to identify weight problems. It estimates body fat based on height and weight. Your health care provider can help determine your BMI and help you achieve or maintain a healthy weight. Get regular exercise Get regular exercise. This is one of the most important things you can do for your health. Most adults should: Exercise for at least 150 minutes each week. The exercise should increase your heart rate and make you sweat (moderate-intensity exercise). Do strengthening exercises at least twice a week. This is in addition to the moderate-intensity exercise. Spend less time sitting. Even light physical activity can be beneficial. Watch cholesterol and blood lipids Have your blood tested for lipids and cholesterol at 33 years of age, then have this test every 5 years. Have your cholesterol levels checked more often if: Your lipid or cholesterol levels are high. You are older than 33 years of age. You are at high risk for heart disease. What should I know about cancer screening? Depending on your health history and family history, you may need to have cancer screening at various ages. This may include screening for: Breast cancer. Cervical cancer. Colorectal cancer. Skin cancer. Lung cancer. What should I know about heart disease, diabetes, and high blood pressure? Blood pressure and heart  disease High blood pressure causes heart disease and increases the risk of stroke. This is more likely to develop in people who have high blood pressure readings, are of African descent, or are overweight. Have your blood pressure checked: Every 3-5 years if you are 18-39 years of age. Every year if you are 40 years old or older. Diabetes Have regular diabetes screenings. This checks your fasting blood sugar level. Have the screening done: Once every three years after age 40 if you are at a normal weight and have a low risk for diabetes. More often and at a younger age if you are overweight or have a high risk for diabetes. What should I know about preventing infection? Hepatitis B If you have a higher risk for hepatitis B, you should be screened for this virus. Talk with your health care provider to find out if you are at risk for hepatitis B infection. Hepatitis C Testing is recommended for: Everyone born from 1945 through 1965. Anyone with known risk factors for hepatitis C. Sexually transmitted infections (STIs) Get screened for STIs, including gonorrhea and chlamydia, if: You are sexually active and are younger than 33 years of age. You are older than 33 years of age and your health care provider tells you that you are at risk for this type of infection. Your sexual activity has changed since you were last screened, and you are at increased risk for chlamydia or gonorrhea. Ask your health care provider if you are at risk. Ask your health care provider about whether you are at high risk for HIV. Your health care provider may recommend a prescription medicine   to help prevent HIV infection. If you choose to take medicine to prevent HIV, you should first get tested for HIV. You should then be tested every 3 months for as long as you are taking the medicine. Pregnancy If you are about to stop having your period (premenopausal) and you may become pregnant, seek counseling before you get  pregnant. Take 400 to 800 micrograms (mcg) of folic acid every day if you become pregnant. Ask for birth control (contraception) if you want to prevent pregnancy. Osteoporosis and menopause Osteoporosis is a disease in which the bones lose minerals and strength with aging. This can result in bone fractures. If you are 65 years old or older, or if you are at risk for osteoporosis and fractures, ask your health care provider if you should: Be screened for bone loss. Take a calcium or vitamin D supplement to lower your risk of fractures. Be given hormone replacement therapy (HRT) to treat symptoms of menopause. Follow these instructions at home: Lifestyle Do not use any products that contain nicotine or tobacco, such as cigarettes, e-cigarettes, and chewing tobacco. If you need help quitting, ask your health care provider. Do not use street drugs. Do not share needles. Ask your health care provider for help if you need support or information about quitting drugs. Alcohol use Do not drink alcohol if: Your health care provider tells you not to drink. You are pregnant, may be pregnant, or are planning to become pregnant. If you drink alcohol: Limit how much you use to 0-1 drink a day. Limit intake if you are breastfeeding. Be aware of how much alcohol is in your drink. In the [place] one drink equals one 12 oz bottle of beer (355 mL), one 5 oz glass of wine (148 mL), or one 1 oz glass of hard liquor (44 mL). General instructions Schedule regular health, dental, and eye exams. Stay current with your vaccines. Tell your health care provider if: You often feel depressed. You have ever been abused or do not feel safe at home. Summary Adopting a healthy lifestyle and getting preventive care are important in promoting health and wellness. Follow your health care provider's instructions about healthy diet, exercising, and getting tested or screened for diseases. Follow your health care provider's  instructions on monitoring your cholesterol and blood pressure. This information is not intended to replace advice given to you by your health care provider. Make sure you discuss any questions you have with your health care provider. Document Revised: 08/23/2020 Document Reviewed: 06/08/2018 Elsevier Patient Education  2022 [employer]  

## 2021-03-24 NOTE — Progress Notes (Signed)
Subjective:  Patient ID: Kristina Stewart, female    DOB: 23-Feb-1986  Age: 35 y.o. MRN: [id number]  CC: Annual Exam and Anemia  This visit occurred during the SARS-CoV-2 public health emergency.  Safety protocols were in place, including screening questions prior to the visit, additional usage of staff PPE, and extensive cleaning of exam room while observing appropriate contact time as indicated for disinfecting solutions.    HPI Kristina Stewart presents for a CPX, f/up, and to establish.  She has a hx of iron deficiency anemia that occurred years ago during her pregnancy.  She is no longer taking an iron supplement.  She feels well and offers no complaints.  She has a history of chronic upper extremity pain (hands mostly) related to overuse activity that occurs in her line of work.  She has had a thorough work-up with neurology including nerve conduction studies and MRIs and nothing abnormal has been found.  She would like a referral to see a physical therapist.  History Kristina Stewart has a past medical history of Allergy, Asthma, Family history of adverse reaction to anesthesia, IBS (irritable bowel syndrome), Obesity, Seasonal allergic rhinitis, Strep pharyngitis, TMJ (temporomandibular joint syndrome), and Vertigo.   She has a past surgical history that includes Brain surgery and Brain surgery.   Her family history includes Alzheimer's disease in her paternal grandfather; CAD in her maternal uncle; Cancer in her maternal grandfather and maternal grandmother; Diabetes in her paternal grandmother; Fibromyalgia in her paternal grandmother; Fragile X syndrome in her sister; GER disease in her sister; Hyperlipidemia in her father and mother; Hypertension in her father, maternal grandmother, and mother; Irritable bowel syndrome in her father; Neuropathy in her mother; Thyroid disease in her maternal grandmother and mother.She reports that she has never smoked. She has never used smokeless tobacco.  She reports that she does not drink alcohol and does not use drugs.  Outpatient Medications Prior to Visit  Medication Sig Dispense Refill   albuterol (PROVENTIL) (2.5 MG/3ML) 0.083% nebulizer solution Take 3 mLs (2.5 mg total) by nebulization every 6 (six) hours as needed for wheezing or shortness of breath (coughing fits). 75 mL 2   albuterol (VENTOLIN HFA) 108 (90 Base) MCG/ACT inhaler Inhale 2 puffs into the lungs every 6 (six) hours as needed for wheezing or shortness of breath. 6.7 g prn   Azelastine-Fluticasone 137-50 MCG/ACT SUSP USE 1 SPRAY INTO EACH NOSTRIL TWICE A DAY AS DIRECTED 23 g 3   Crisaborole (EUCRISA) 2 % OINT Apply 1 application topically 2 (two) times daily as needed. 100 g 5   etonogestrel-ethinyl estradiol (NUVARING) 0.12-0.015 MG/24HR vaginal ring INSERT 1 RING VAGINALLY EVERY MONTH (Patient taking differently: INSERT 1 RING VAGINALLY EVERY MONTH) 3 each 3   fexofenadine (ALLEGRA) 180 MG tablet Take 180 mg by mouth daily.     fluticasone-salmeterol (ADVAIR DISKUS) 500-50 MCG/ACT AEPB Inhale 1 puff into the lungs in the morning and at bedtime. Rinse mouth after each use. 60 each 3   ipratropium (ATROVENT) 0.03 % nasal spray Place 1-2 sprays into both nostrils 2 (two) times daily as needed (nasal drainage). 30 mL 5   ipratropium-albuterol (DUONEB) 0.5-2.5 (3) MG/3ML SOLN Take 3 mLs by nebulization every 4 (four) hours as needed (coughing fits, shortness of breath, wheezing). 180 mL 2   ketotifen (ZADITOR) 0.025 % ophthalmic solution 2 drops 2 (two) times daily.     levocetirizine (XYZAL) 5 MG tablet Take 5 mg by mouth every evening.     montelukast (SINGULAIR) 10  MG tablet Take 1 tablet (10 mg total) by mouth at bedtime. 90 tablet 2   Multiple Vitamin (MULTIVITAMIN PO) Take by mouth.     sertraline (ZOLOFT) 25 MG tablet Take 25 mg by mouth daily.     sertraline (ZOLOFT) 25 MG tablet Take 1 tablet (25 mg total) by mouth daily. 30 tablet 2   triamcinolone ointment (KENALOG)  0.1 % Apply 1 application topically 2 (two) times daily. 30 g 2   No facility-administered medications prior to visit.    ROS Review of Systems  Constitutional:  Negative for chills, diaphoresis and fatigue.  HENT: Negative.    Eyes: Negative.   Respiratory:  Negative for cough, chest tightness, shortness of breath and wheezing.   Cardiovascular:  Negative for chest pain, palpitations and leg swelling.  Gastrointestinal:  Negative for abdominal pain, blood in stool, constipation, diarrhea, nausea and vomiting.  Endocrine: Negative.   Genitourinary: Negative.  Negative for difficulty urinating and dysuria.  Musculoskeletal:  Negative for arthralgias and myalgias.  Skin: Negative.   Neurological:  Negative for dizziness, weakness, light-headedness, numbness and headaches.  Hematological:  Negative for adenopathy. Does not bruise/bleed easily.  Psychiatric/Behavioral: Negative.     Objective:  BP 122/76 (BP Location: Left Arm, Patient Position: Sitting, Cuff Size: Large)   Pulse 72   Temp 98.1 F (36.7 C) (Oral)   Resp 16   Ht 5' 4.5" (1.638 m)   Wt 240 lb (108.9 kg)   LMP 03/11/2021 (Exact Date)   SpO2 98%   BMI 40.56 kg/m   Physical Exam Vitals reviewed.  HENT:     Nose: Nose normal.     Mouth/Throat:     Mouth: Mucous membranes are moist.  Eyes:     General: No scleral icterus.    Conjunctiva/sclera: Conjunctivae normal.  Cardiovascular:     Rate and Rhythm: Normal rate and regular rhythm.     Heart sounds: No murmur heard. Pulmonary:     Effort: Pulmonary effort is normal.     Breath sounds: No stridor. No wheezing, rhonchi or rales.  Abdominal:     General: Abdomen is flat.     Palpations: There is no mass.     Tenderness: There is no abdominal tenderness. There is no guarding.     Hernia: No hernia is present.  Musculoskeletal:        General: No tenderness or deformity. Normal range of motion.     Cervical back: Neck supple.     Right lower leg: No edema.      Left lower leg: No edema.  Skin:    General: Skin is warm and dry.  Neurological:     General: No focal deficit present.     Mental Status: She is alert.  Psychiatric:        Mood and Affect: Mood normal.        Behavior: Behavior normal.    Lab Results  Component Value Date   WBC 9.6 03/24/2021   HGB 13.1 03/24/2021   HCT 40.3 03/24/2021   PLT 270.0 03/24/2021   GLUCOSE 83 07/11/2020   CHOL 186 04/21/2019   TRIG 132 04/21/2019   HDL 52 04/21/2019   LDLCALC 109 (H) 04/21/2019   ALT 23 07/11/2020   AST 24 07/11/2020   NA 141 07/11/2020   K 4.0 07/11/2020   CL 107 07/11/2020   CREATININE 0.79 07/11/2020   BUN 14 07/11/2020   CO2 24 07/11/2020   TSH 1.67 04/21/2019  HGBA1C 5.2 01/09/2014     Assessment & Plan:   Kristina Stewart was seen today for annual exam and anemia.  Diagnoses and all orders for this visit:  Encounter for general adult medical examination with abnormal findings- Exam completed, labs reviewed, she deferred on flu vaccine, cancer screenings are up-to-date, patient education was given.  Overuse syndrome of hand, unspecified laterality, initial encounter -     Ambulatory referral to Physical Therapy  Iron deficiency anemia due to chronic blood loss- Her H&H and iron levels are normal now. -     CBC with Differential/Platelet; Future -     IBC + Ferritin; Future -     IBC + Ferritin -     CBC with Differential/Platelet  I am having Kristina Stewart maintain her levocetirizine, Multiple Vitamin (MULTIVITAMIN PO), albuterol, Eucrisa, triamcinolone ointment, sertraline, ketotifen, etonogestrel-ethinyl estradiol, Azelastine-Fluticasone, fexofenadine, ipratropium, fluticasone-salmeterol, montelukast, sertraline, albuterol, and ipratropium-albuterol.  No orders of the defined types were placed in this encounter.    Follow-up: Return in about 6 months (around 09/21/2021).  Sanda Linger, MD

## 2021-03-25 ENCOUNTER — Encounter: Payer: Self-pay | Admitting: Internal Medicine

## 2021-03-26 ENCOUNTER — Encounter: Payer: Self-pay | Admitting: Internal Medicine

## 2021-03-27 ENCOUNTER — Ambulatory Visit (INDEPENDENT_AMBULATORY_CARE_PROVIDER_SITE_OTHER): Payer: No Typology Code available for payment source

## 2021-03-27 DIAGNOSIS — J309 Allergic rhinitis, unspecified: Secondary | ICD-10-CM

## 2021-04-03 ENCOUNTER — Ambulatory Visit (INDEPENDENT_AMBULATORY_CARE_PROVIDER_SITE_OTHER): Payer: No Typology Code available for payment source | Admitting: *Deleted

## 2021-04-03 DIAGNOSIS — J309 Allergic rhinitis, unspecified: Secondary | ICD-10-CM

## 2021-04-10 ENCOUNTER — Ambulatory Visit (INDEPENDENT_AMBULATORY_CARE_PROVIDER_SITE_OTHER): Payer: No Typology Code available for payment source | Admitting: *Deleted

## 2021-04-10 DIAGNOSIS — J309 Allergic rhinitis, unspecified: Secondary | ICD-10-CM | POA: Diagnosis not present

## 2021-04-14 ENCOUNTER — Other Ambulatory Visit (HOSPITAL_COMMUNITY): Payer: Self-pay

## 2021-04-14 MED FILL — Etonogestrel-Ethinyl Estradiol VA Ring 0.12-0.015 MG/24HR: VAGINAL | 84 days supply | Qty: 3 | Fill #2 | Status: AC

## 2021-04-17 ENCOUNTER — Ambulatory Visit (INDEPENDENT_AMBULATORY_CARE_PROVIDER_SITE_OTHER): Payer: No Typology Code available for payment source | Admitting: *Deleted

## 2021-04-17 DIAGNOSIS — J309 Allergic rhinitis, unspecified: Secondary | ICD-10-CM | POA: Diagnosis not present

## 2021-05-02 ENCOUNTER — Ambulatory Visit (INDEPENDENT_AMBULATORY_CARE_PROVIDER_SITE_OTHER): Payer: No Typology Code available for payment source

## 2021-05-02 DIAGNOSIS — J309 Allergic rhinitis, unspecified: Secondary | ICD-10-CM | POA: Diagnosis not present

## 2021-05-06 ENCOUNTER — Other Ambulatory Visit (HOSPITAL_BASED_OUTPATIENT_CLINIC_OR_DEPARTMENT_OTHER): Payer: Self-pay

## 2021-05-06 ENCOUNTER — Ambulatory Visit: Payer: No Typology Code available for payment source | Attending: Internal Medicine

## 2021-05-06 DIAGNOSIS — Z23 Encounter for immunization: Secondary | ICD-10-CM

## 2021-05-06 MED ORDER — PFIZER COVID-19 VAC BIVALENT 30 MCG/0.3ML IM SUSP
INTRAMUSCULAR | 0 refills | Status: DC
  Filled 2021-05-06: qty 0.3, 1d supply, fill #0

## 2021-05-06 NOTE — Progress Notes (Signed)
   Covid-19 Vaccination Clinic  Name:  Kristina Stewart    MRN: [id number] DOB: 1986/01/17  05/06/2021  [name] was observed post Covid-19 immunization for 15 minutes without incident. She was provided with Vaccine Information Sheet and instruction to access the V-Safe system.   [name] was instructed to call 911 with any severe reactions post vaccine: Difficulty breathing  Swelling of face and throat  A fast heartbeat  A bad rash all over body  Dizziness and weakness   Immunizations Administered     Name Date Dose VIS Date Route   Pfizer Covid-19 Vaccine Bivalent Booster 05/06/2021 12:42 PM 0.3 mL 02/26/2021 Intramuscular   Manufacturer: [employer], [employer]   Lot: [id number]   NDC: [phone]

## 2021-05-13 ENCOUNTER — Other Ambulatory Visit (HOSPITAL_COMMUNITY): Payer: Self-pay

## 2021-05-13 ENCOUNTER — Other Ambulatory Visit: Payer: Self-pay | Admitting: Allergy

## 2021-05-13 ENCOUNTER — Ambulatory Visit: Payer: No Typology Code available for payment source | Attending: Internal Medicine | Admitting: Physical Therapy

## 2021-05-13 ENCOUNTER — Other Ambulatory Visit: Payer: Self-pay

## 2021-05-13 DIAGNOSIS — M6281 Muscle weakness (generalized): Secondary | ICD-10-CM | POA: Diagnosis present

## 2021-05-13 DIAGNOSIS — M79601 Pain in right arm: Secondary | ICD-10-CM

## 2021-05-13 DIAGNOSIS — M542 Cervicalgia: Secondary | ICD-10-CM

## 2021-05-13 DIAGNOSIS — M79622 Pain in left upper arm: Secondary | ICD-10-CM

## 2021-05-13 NOTE — Patient Instructions (Signed)

## 2021-05-13 NOTE — Therapy (Signed)
[facility] [address] [place], [place], [address] Phone: [phone]   Fax:  [phone]  Physical Therapy Evaluation  Patient Details  Name: Kristina Stewart MRN: [id number] Date of Birth: 01-24-1986 Referring Provider (PT): Etta Grandchild MD   Encounter Date: 05/13/2021   PT End of Session - 05/13/21 0919     Visit Number 1    Number of Visits 6    Date for PT Re-Evaluation 06/26/21    Authorization Type [facility]    PT Start Time 0800    PT Stop Time 0845    PT Time Calculation (min) 45 min    Activity Tolerance Patient tolerated treatment well    Behavior During Therapy [facility] for tasks assessed/performed             Past Medical History:  Diagnosis Date   Allergy    Asthma    Family history of adverse reaction to anesthesia    sister problems with propofol   IBS (irritable bowel syndrome)    Obesity    Seasonal allergic rhinitis    Strep pharyngitis    TMJ (temporomandibular joint syndrome)    Vertigo     Past Surgical History:  Procedure Laterality Date   BRAIN SURGERY     craniotomy/arnold chiari malformation; craniotomy & laminectomy   BRAIN SURGERY     craniotomy/arnold chiari malformation    There were no vitals filed for this visit.    Subjective Assessment - 05/13/21 0809     Subjective I  have been here before with [name] for bil ulnar neuropathy. and he dry needled me and it worked well.  I have NCS/EMG   and it was normal and Brain MRI  all normal.  I have recently had another child (83 month old boy)  I notice I have the same symptoms since caring for a newborn. as I did with my first son.  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  I get pain in in my bil wrist and medial elbow. I would like to do dry needling again which helped with my symptoms last time    Pertinent History TMJ,  craniotomy in 2006 Arnold Chiari  malformation,    Limitations House hold activities   gripping, opening jars, doing work as sonographer   Diagnostic tests MRI normal, EMG/NCS all normal    Patient Stated Goals To get strength back in hands and reduce pain    Currently in Pain? 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, lying in arms while feeding him    Pain Relieving Factors rest    Effect of Pain on Daily Activities unable to grip handle for sonogram, affects gripping and work duties                [facility] PT Assessment - 05/13/21 0001       Assessment   Medical Diagnosis overuse syndrome of bil hands    Referring Provider (PT) Etta Grandchild MD    Onset Date/Surgical Date 11/13/20    Hand Dominance Right  Prior Therapy prior therapy at [facility] [address]      Precautions   Precautions None    Precaution Comments allergic to latex ( rash)      Restrictions   Weight Bearing Restrictions No      Balance Screen   Has the patient fallen in the past 6 months No    Has the patient had a decrease in activity level because of a fear of falling?  No    Is the patient reluctant to leave their home because of a fear of falling?  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      Cognition   Overall Cognitive Status Within Functional Limits for tasks assessed      Observation/Other Assessments    Focus on Therapeutic Outcomes (FOTO)  FOTO intake 55% predictred 67%      Sensation   Additional Comments Decreased sensation to light touch on left C8 to T-1 dermatomes      Posture/Postural Control   Posture/Postural Control Postural limitations    Postural Limitations Forward head;Rounded Shoulders    Posture Comments mild forward head      ROM / Strength   AROM / PROM / Strength AROM;Strength      AROM   Overall AROM  Deficits    Overall AROM Comments Bil shlder , [place] wrist and hand AROM grossly [facility]    Cervical Flexion 38    Cervical Extension 55    Cervical - Right Side Bend 34    Cervical - Left Side Bend 25    Cervical - Right Rotation 55    Cervical - Left Rotation 38      Strength   Overall Strength Deficits    Right Hand Grip (lbs) 26.5   30, 26 25   Left Hand Grip (lbs) 25.6   29, 26, 22     Palpation   Palpation comment Tight bil upper traps TTP to bil flexor carpi ulnaris bil                        Objective measurements completed on examination: See above findings.         Trigger Point Dry Needling - 05/13/21 0001     Consent Given? Yes    Education Handout Provided Yes    Muscles Treated Wrist/Hand Flexor carpi ulnaris   bil   Dry Needling Comments 100 pps TENS to bil    Flexor carpi ulnaris Response Twitch response elicited;Palpable increased muscle length                   PT Education - 05/13/21 1332     Education Details POC Explanatin of findings, dry needling    Person(s) Educated Patient    Methods Explanation;Demonstration;Tactile cues;Verbal cues;Handout    Comprehension Returned demonstration;Verbalized understanding              PT Short Term Goals - 05/13/21 0843       PT SHORT TERM GOAL #1  Title STG=LTG               PT Long Term Goals - 05/13/21 0843       PT LONG TERM GOAL #1   Title Pt will be independent with advanced HEP    Time 6    Period Weeks    Status New    Target  Date 06/26/21      PT LONG TERM GOAL #2   Title Improve FOTO intake to 67%    Baseline FOTO eval 55%    Time 6    Period Weeks    Status New    Target Date 06/26/21      PT LONG TERM GOAL #3   Title Increase left cervical rotation 5-10 deg to improve ability to turn head while driving    Baseline L rotation 38, R 55    Time 6    Period Weeks    Status New    Target Date 06/26/21      PT LONG TERM GOAL #4   Title R grip 40  lbs. or greater at 3rd rep of grip dynamonometer testing to improve grip endurance for work duties    Baseline Pt  with R 26.5 grip, and L 25.6 grip    Time 6    Period Weeks    Status New    Target Date 06/26/21      PT LONG TERM GOAL #5   Title Perform gripping activities for cooking, opening jars, work duties with 50% or greater symptom reduction    Baseline Pt with grip weakness with opeing jars at home and caring for 20 month old.  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  Pt had symptoms following caring for 1st newborn son.  Pt had EMG/NCS testing done and brain MRI - all normal.  Pt has since had second son who is now 27 months old and has similar  bil ulnar/ medial elbow and bil  wrist pain and weakness.  Pt demonstrates bil hand grip weakness worsened since last visit.  R 26.5 lb L 25.6 lb . last eval able to grip 60 to 70 lb. Pt with some upper trap tightness but no cervical radiculopathy but does have some cervical region tension and local soft tissue restriction. Pt would benefit from skilled PT to address currecnt symptoms and weaknessess.  Pt consented to TPDN and estim this visit for bil medial / flexor carpi ulnaris.  with no adverse reactions.    Personal Factors  and Comorbidities Comorbidity 1;Comorbidity 2    Comorbidities TMJ,  craniotomy in 2006 Arnold Chiari malformation,    Examination-Activity Limitations Carry;Dressing;Caring for Others    Examination-Participation Restrictions Occupation;Other   caring for child   Stability/Clinical Decision Making Evolving/Moderate complexity    Clinical Decision Making Moderate    Rehab Potential Good    PT Frequency 2x / week    PT Duration 6 weeks    PT Treatment/Interventions ADLs/Self Care Home Management;Cryotherapy;Electrical Stimulation;Iontophoresis 4mg /ml Dexamethasone;Moist Heat;Traction;Therapeutic exercise;Neuromuscular re-education;Patient/family education;Manual techniques;Passive range of motion;Dry needling;Taping;Joint Manipulations;Spinal Manipulations  PT Next Visit Plan e stim to medial elbows, TPDN  grip strength and UE strength Make HEP    Consulted and Agree with Plan of Care Patient             Patient will benefit from skilled therapeutic intervention in order to improve the following deficits and impairments:  Pain, Improper body mechanics, Postural dysfunction, Impaired UE functional use, Impaired sensation, Decreased strength, Decreased range of motion  Visit Diagnosis: Pain in right arm  Pain in left upper arm  Cervicalgia  Muscle weakness (generalized)     Problem List Patient Active Problem List   Diagnosis Date Noted   Overuse syndrome of hand 03/24/2021   Encounter for general adult medical examination with abnormal findings 03/24/2021   Iron deficiency anemia due to chronic blood loss 03/24/2021   Moderate persistent asthma without complication 11/14/2020   Normal labor and delivery 07/09/2020   Pregnancy 11/09/2019   Seasonal and perennial allergic rhinoconjunctivitis 09/11/2019   Pollen-food allergy 06/12/2019   Dyshidrotic eczema 06/12/2019   Family history of fragile X syndrome 03/25/2016   History of frequent upper respiratory infection  04/26/2012   MTHFR mutation 04/01/2012   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 [place], [place], [address] Phone: [phone]   Fax:  [phone]  Name: Tatiyanna Lashley MRN: [id number] Date of Birth: Nov 10, 1985

## 2021-05-14 ENCOUNTER — Ambulatory Visit (INDEPENDENT_AMBULATORY_CARE_PROVIDER_SITE_OTHER): Payer: No Typology Code available for payment source

## 2021-05-14 ENCOUNTER — Other Ambulatory Visit (HOSPITAL_COMMUNITY): Payer: Self-pay

## 2021-05-14 DIAGNOSIS — J309 Allergic rhinitis, unspecified: Secondary | ICD-10-CM | POA: Diagnosis not present

## 2021-05-14 MED ORDER — AZELASTINE-FLUTICASONE 137-50 MCG/ACT NA SUSP
NASAL | 3 refills | Status: DC
  Filled 2021-05-14: qty 23, 30d supply, fill #0
  Filled 2021-07-01: qty 23, 30d supply, fill #1

## 2021-05-15 ENCOUNTER — Other Ambulatory Visit (HOSPITAL_COMMUNITY): Payer: Self-pay

## 2021-05-15 MED ORDER — SERTRALINE HCL 25 MG PO TABS
25.0000 mg | ORAL_TABLET | Freq: Every day | ORAL | 2 refills | Status: DC
  Filled 2021-05-15: qty 30, 30d supply, fill #0
  Filled 2021-06-18: qty 30, 30d supply, fill #1
  Filled 2021-07-18: qty 30, 30d supply, fill #2

## 2021-05-20 ENCOUNTER — Other Ambulatory Visit: Payer: Self-pay

## 2021-05-20 ENCOUNTER — Ambulatory Visit: Payer: No Typology Code available for payment source | Admitting: Physical Therapy

## 2021-05-20 DIAGNOSIS — M79622 Pain in left upper arm: Secondary | ICD-10-CM

## 2021-05-20 DIAGNOSIS — M6281 Muscle weakness (generalized): Secondary | ICD-10-CM

## 2021-05-20 DIAGNOSIS — M79601 Pain in right arm: Secondary | ICD-10-CM

## 2021-05-20 DIAGNOSIS — M542 Cervicalgia: Secondary | ICD-10-CM

## 2021-05-20 NOTE — Therapy (Signed)
[facility] Outpatient Rehabilitation [facility] [address] [place], [place], [address] Phone: [phone]   Fax:  [phone]  Physical Therapy Treatment  Patient Details  Name: Kristina Stewart MRN: [id number] Date of Birth: 1986-02-08 Referring Provider (PT): Etta Grandchild MD   Encounter Date: 05/20/2021   PT End of Session - 05/20/21 1452     Visit Number 2    Number of Visits 6    Date for PT Re-Evaluation 06/26/21    Authorization Type [facility]    PT Start Time 1330    PT Stop Time 1414    PT Time Calculation (min) 44 min    Activity Tolerance Patient tolerated treatment well    Behavior During Therapy [facility] for tasks assessed/performed             Past Medical History:  Diagnosis Date   Allergy    Asthma    Family history of adverse reaction to anesthesia    sister problems with propofol   IBS (irritable bowel syndrome)    Obesity    Seasonal allergic rhinitis    Strep pharyngitis    TMJ (temporomandibular joint syndrome)    Vertigo     Past Surgical History:  Procedure Laterality Date   BRAIN SURGERY     craniotomy/arnold chiari malformation; craniotomy & laminectomy   BRAIN SURGERY     craniotomy/arnold chiari malformation    There were no vitals filed for this visit.   Subjective Assessment - 05/20/21 1339     Subjective I have had some pain and paresthesias in my left  elbow 3/10 and hands 2/10.  I am concerned about my grip weakness and I am ready to work on it    Pertinent History TMJ,  craniotomy in 2006 Arnold Chiari malformation,    Limitations House hold activities    Diagnostic tests MRI normal, EMG/NCS all normal    Patient Stated Goals To get strength back in hands and reduce pain    Currently in Pain? 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 Carrying 15 #KB in Right and 25# on the left.  Feeling parathesias  bil fingers but more severe in 4th and 5th digits - Standing bil extension with RTB using cardboard cylinders for grips around door knob 3 reps x 10 - scapular retraction/ row with RTB 3 x 10 - External Rotation bil with RTB 3 x 10 - Seated Wrist Extension with Dumbbell 3 sets - 10 reps  R and L - Seated Wrist Flexion with Dumbbell - 3 sets - 10 reps  R and L - Seated Wrist Radial Deviation with Dumbbell -  sets - 10 reps R and L  Manual Therapy:  Modalities Estim 100pps to pt tolerance over thenar eminence/ flexor carpi ulnaris bil for 10 min     Neuromuscular re-ed:     Therapeutic Activity:     Self Care:  ITEMS NOT PERFORMED TODAY:      Access Code: [email or web address]: [email or web address]: 11/22/2022Prepared by: Wayland Denis BeardsleyExercises  Farmer's Carry with Kettlebells - 1 x daily - 7 x weekly - 3 sets - 10 reps  Shoulder External Rotation and Scapular Retraction with Resistance - 2 x daily - 7 x weekly - 3 sets - 10 reps  Scapular Retraction with Resistance - 2 x daily - 7 x weekly - 3 sets - 10 reps  Scapular Retraction with Resistance Advanced - 2 x daily - 7 x weekly - 3 sets - 10 reps  Seated Wrist Extension with Dumbbell - 1 x daily - 7 x weekly - 3 sets - 10 reps  Seated Wrist Flexion with Dumbbell - 1 x daily - 7 x weekly - 3 sets - 10 reps  Seated Wrist Radial Deviation with Dumbbell - 1 x daily - 7 x weekly - 3 sets - 10 reps           [facility] Adult PT Treatment/Exercise - 05/20/21 0001       Self-Care   Self-Care Other Self-Care Comments    Other Self-Care Comments  movement and hydration and pain managment              Trigger Point Dry  Needling - 05/20/21 0001     Consent Given? Yes    Education Handout Provided Previously provided    Muscles Treated Wrist/Hand Flexor carpi ulnaris;Opponens pollicis   bil   Dry Needling Comments 100 pps TENS to bil 10 min    Flexor carpi ulnaris Response Twitch response elicited;Palpable increased muscle length    Opponens pollicis Response Twitch response elicited;Palpable increased muscle length                   PT Education - 05/20/21 1445     Education Details added to HEP grip and sub scapular stabilizers self care for hydration / movement benefti and pain managment    Person(s) Educated Patient    Methods Explanation;Demonstration;Tactile cues;Verbal cues;Handout    Comprehension Verbalized understanding;Returned demonstration              PT Short Term Goals - 05/13/21 0843       PT SHORT TERM GOAL #1   Title STG=LTG               PT Long Term Goals - 05/13/21 0843       PT LONG TERM GOAL #1   Title Pt will be independent with advanced HEP    Time 6    Period Weeks    Status New    Target Date 06/26/21      PT LONG TERM GOAL #2   Title Improve FOTO intake to 67%    Baseline FOTO eval 55%    Time 6    Period Weeks    Status New    Target Date 06/26/21      PT LONG TERM GOAL #3   Title Increase left cervical rotation 5-10 deg to improve ability to turn head while driving    Baseline L rotation 38, R 55    Time 6    Period Weeks    Status New    Target Date 06/26/21      PT LONG TERM GOAL #4   Title R grip 40  lbs. or greater at 3rd rep of grip dynamonometer testing to improve grip endurance for work duties    Baseline Pt  with R 26.5 grip,  and L 25.6 grip    Time 6    Period Weeks    Status New    Target Date 06/26/21      PT LONG TERM GOAL #5   Title Perform gripping activities for cooking, opening jars, work duties with 50% or greater symptom reduction    Baseline Pt with grip weakness with opeing jars at home and caring for  35 month old.  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  Pt consented to TPDN and was closely monitored throughout session.  Pt is cognizant of her need to increase strength and make time for exercise. she is compliant with many other health attitudes like nutrition and Hydration. She admits that a  mom of young children and sleep is not as good as it needs to be.  Pt does complain of pain and paresthesias.    Personal Factors and Comorbidities Comorbidity 1;Comorbidity 2    Comorbidities TMJ,  craniotomy in 2006 Arnold Chiari malformation,    Examination-Activity Limitations Carry;Dressing;Caring for Others    Examination-Participation Restrictions Occupation;Other    PT Frequency 2x / week    PT Duration 6 weeks    PT Treatment/Interventions ADLs/Self Care Home Management;Cryotherapy;Electrical Stimulation;Iontophoresis 4mg /ml Dexamethasone;Moist Heat;Traction;Therapeutic exercise;Neuromuscular re-education;Patient/family education;Manual techniques;Passive range of motion;Dry needling;Taping;Joint Manipulations;Spinal Manipulations    PT Next Visit Plan e stim to medial elbows, TPDN  grip strength and UE strength Make HEP  sleep hygiene    PT Home Exercise Plan [phone]    Consulted and Agree with Plan of Care Patient             Patient will benefit from skilled therapeutic intervention in order to improve the following deficits and impairments:  Pain, Improper body mechanics, Postural dysfunction, Impaired UE functional use, Impaired sensation, Decreased strength, Decreased range of motion  Visit Diagnosis: Pain in right arm  Pain in left upper  arm  Cervicalgia  Muscle weakness (generalized)     Problem List Patient Active Problem List   Diagnosis Date Noted   Overuse syndrome of hand 03/24/2021   Encounter for general adult medical examination with abnormal findings 03/24/2021   Iron deficiency anemia due to chronic blood loss 03/24/2021   Moderate persistent asthma without complication 11/14/2020   Normal labor and delivery 07/09/2020   Pregnancy 11/09/2019   Seasonal and perennial allergic rhinoconjunctivitis 09/11/2019   Pollen-food allergy 06/12/2019   Dyshidrotic eczema 06/12/2019   Family history of fragile X syndrome 03/25/2016   History of frequent upper respiratory infection 04/26/2012   MTHFR mutation 04/01/2012   Homozygous MTHFR mutation [id number] 01/18/2012    01/20/2012, PT, ATRIC Certified Exercise Expert for the Aging Adult  05/20/21 2:53 PM Phone: [phone] Fax: [phone]   [facility] Outpatient Rehabilitation [facility] [address] [place], [place], [place] Phone: [phone]   Fax:  [phone]  Name: Kristina Stewart MRN: Gertie Fey Date of Birth: 09/01/1985

## 2021-05-25 ENCOUNTER — Telehealth: Payer: No Typology Code available for payment source | Admitting: Emergency Medicine

## 2021-05-25 DIAGNOSIS — J029 Acute pharyngitis, unspecified: Secondary | ICD-10-CM | POA: Diagnosis not present

## 2021-05-25 MED ORDER — CHLORHEXIDINE GLUCONATE 0.12 % MT SOLN: 15.0000 mL | Freq: Two times a day (BID) | OROMUCOSAL | 0 refills | Status: DC

## 2021-05-25 MED ORDER — MELOXICAM 15 MG PO TABS: 15.0000 mg | ORAL_TABLET | Freq: Every day | ORAL | 0 refills | Status: DC

## 2021-05-25 NOTE — Progress Notes (Signed)
I have spent 5 minutes in review of e-visit questionnaire, review and updating patient chart, medical decision making and response to patient.   Kayte Borchard, PA-C    

## 2021-05-25 NOTE — Progress Notes (Signed)
E-Visit for Sore Throat  We are sorry that you are not feeling well.  Here is how we plan to help!  Your symptoms indicate a likely viral infection (Pharyngitis).   Pharyngitis is inflammation in the back of the throat which can cause a sore throat, scratchiness and sometimes difficulty swallowing.   Pharyngitis is typically caused by a respiratory virus and will just run its course.  Please keep in mind that your symptoms could last up to 10 days.  For throat pain, we recommend over the counter oral pain relief medications such as acetaminophen or aspirin, or anti-inflammatory medications such as ibuprofen or naproxen sodium.  Topical treatments such as oral throat lozenges or sprays may be used as needed.  Avoid close contact with loved ones, especially the very young and elderly.  Remember to wash your hands thoroughly throughout the day as this is the number one way to prevent the spread of infection and wipe down door knobs and counters with disinfectant.  After careful review of your answers, I would not recommend and antibiotic for your condition.  Antibiotics should not be used to treat conditions that we suspect are caused by viruses like the virus that causes the common cold or flu. However, some people can have Strep with atypical symptoms. You may need formal testing in clinic or office to confirm if your symptoms continue or worsen.  Providers prescribe antibiotics to treat infections caused by bacteria. Antibiotics are very powerful in treating bacterial infections when they are used properly.  To maintain their effectiveness, they should be used only when necessary.  Overuse of antibiotics has resulted in the development of super bugs that are resistant to treatment!    I will send in peridex mouth wash and mobic.  These medications will help with the pain you are experiencing.  I hope you start to feel better soon!  Home Care: Only take medications as instructed by your medical  team. Do not drink alcohol while taking these medications. A steam or ultrasonic humidifier can help congestion.  You can place a towel over your head and breathe in the steam from hot water coming from a faucet. Avoid close contacts especially the very young and the elderly. Cover your mouth when you cough or sneeze. Always remember to wash your hands.  Get Help Right Away If: You develop worsening fever or throat pain. You develop a severe head ache or visual changes. Your symptoms persist after you have completed your treatment plan.  Make sure you Understand these instructions. Will watch your condition. Will get help right away if you are not doing well or get worse.   Thank you for choosing an e-visit.  Your e-visit answers were reviewed by a board certified advanced clinical practitioner to complete your personal care plan. Depending upon the condition, your plan could have included both over the counter or prescription medications.  Please review your pharmacy choice. Make sure the pharmacy is open so you can pick up prescription now. If there is a problem, you may contact your provider through [employer] and have the prescription routed to another pharmacy.  Your safety is important to [place]. If you have drug allergies check your prescription carefully.   For the next 24 hours you can use MyChart to ask questions about today's visit, request a non-urgent call back, or ask for a work or school excuse. You will get an email in the next two days asking about your experience. I hope that your  e-visit has been valuable and will speed your recovery.

## 2021-05-27 ENCOUNTER — Ambulatory Visit: Payer: No Typology Code available for payment source | Admitting: Physical Therapy

## 2021-05-27 ENCOUNTER — Other Ambulatory Visit: Payer: Self-pay

## 2021-05-27 ENCOUNTER — Encounter: Payer: Self-pay | Admitting: Physical Therapy

## 2021-05-27 DIAGNOSIS — M6281 Muscle weakness (generalized): Secondary | ICD-10-CM

## 2021-05-27 DIAGNOSIS — M79601 Pain in right arm: Secondary | ICD-10-CM | POA: Diagnosis not present

## 2021-05-27 DIAGNOSIS — M79622 Pain in left upper arm: Secondary | ICD-10-CM

## 2021-05-27 DIAGNOSIS — M542 Cervicalgia: Secondary | ICD-10-CM

## 2021-05-27 NOTE — Therapy (Signed)
[facility] Outpatient Rehabilitation [facility] [address] [place], [place], [address] Phone: [phone]   Fax:  [phone]  Physical Therapy Treatment  Patient Details  Name: Kristina Stewart MRN: [id number] Date of Birth: 1986/04/19 Referring Provider (PT): Etta Grandchild MD   Encounter Date: 05/27/2021   PT End of Session - 05/27/21 1346     Visit Number 3    Number of Visits 6    Date for PT Re-Evaluation 06/26/21    Authorization Type [facility]    PT Start Time 0800    PT Stop Time 0850    PT Time Calculation (min) 50 min    Activity Tolerance Patient tolerated treatment well    Behavior During Therapy [facility] for tasks assessed/performed             Past Medical History:  Diagnosis Date   Allergy    Asthma    Family history of adverse reaction to anesthesia    sister problems with propofol   IBS (irritable bowel syndrome)    Obesity    Seasonal allergic rhinitis    Strep pharyngitis    TMJ (temporomandibular joint syndrome)    Vertigo     Past Surgical History:  Procedure Laterality Date   BRAIN SURGERY     craniotomy/arnold chiari malformation; craniotomy & laminectomy   BRAIN SURGERY     craniotomy/arnold chiari malformation    There were no vitals filed for this visit.   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  I am a 1/10 now.  I am going to work after PT.when I use the putty and it is really work. I need to learn how to sleep at night  I am a side sleeper. I seem to over flex my elbow at night.    Pertinent History TMJ,  craniotomy in 2006 Arnold Chiari malformation,    Limitations House hold activities    Diagnostic tests MRI normal, EMG/NCS all normal    Patient Stated Goals To get strength back in hands and reduce pain    Currently in Pain? Yes    Pain Score 1     Pain Location Hand    Pain Orientation Right;Left    Pain Descriptors /  Indicators Dull    Pain Type Chronic pain    Multiple Pain Sites Yes    Pain Score 1   at times 7/10   Pain Location Elbow    Pain Orientation Right    Pain Descriptors / Indicators Aching;Dull    Pain Type Chronic pain    Pain Onset More than a month ago    Pain Frequency Occasional    Pain Score 4    Pain Location Wrist    Pain Orientation Right    Pain Descriptors / Indicators Sharp    Pain Type Chronic pain                [facility] PT Assessment - 05/27/21 0001       AROM   Cervical - Right Rotation 60    Cervical - Left Rotation 40      Strength   Right Hand Grip (lbs) 32    Left Hand Grip (lbs) 36                [facility] Adult PT Treatment/Exercise:   Therapeutic Exercise: - Farmers Carry for 300 ft x 2 Carrying 25 #KB  in Right and 25# on the left.  Feeling parathesias  bil fingers but more severe in 4th and 5th digits Ulnar, radial and medial nerve flossing between every 150 feet   Manual Therapy:   Modalities Estim 100pps to pt tolerance over thenar eminence/ flexor carpi ulnaris bil for 10 min       Neuromuscular re-ed:       Therapeutic Activity:       Self Care:     ITEMS NOT PERFORMED TODAY:     - Standing bil extension with RTB using cardboard cylinders for grips around door knob 3 reps x 10 - scapular retraction/ row with RTB 3 x 10 - External Rotation bil with RTB 3 x 10 - Seated Wrist Extension with Dumbbell 3 sets - 10 reps  R and L - Seated Wrist Flexion with Dumbbell - 3 sets - 10 reps  R and L - Seated Wrist Radial Deviation with Dumbbell -  sets - 10 reps R and L            Trigger Point Dry Needling - 05/27/21 0001     Consent Given? Yes    Education Handout Provided Previously provided    Muscles Treated Head and Neck Upper trapezius;Levator scapulae   bil   Muscles Treated Wrist/Hand Flexor carpi ulnaris;Opponens pollicis   bil   Dry Needling Comments 100 pps TENS to bil 10 min    Upper Trapezius Response  Twitch reponse elicited;Palpable increased muscle length    Levator Scapulae Response Twitch response elicited;Palpable increased muscle length    Flexor carpi ulnaris Response Twitch response elicited;Palpable increased muscle length    Opponens pollicis Response Twitch response elicited;Palpable increased muscle length                     PT Short Term Goals - 05/13/21 0843       PT SHORT TERM GOAL #1   Title STG=LTG               PT Long Term Goals - 05/27/21 1357       PT LONG TERM GOAL #1   Title Pt will be independent with advanced HEP    Time 6    Period Weeks    Status On-going      PT LONG TERM GOAL #2   Title Improve FOTO intake to 67%    Baseline FOTO eval 55%    Time 6    Period Weeks    Status Unable to assess      PT LONG TERM GOAL #3   Title Increase left cervical rotation 5-10 deg to improve ability to turn head while driving    Baseline L rotation 40, R 60    Time 6    Period Weeks    Status On-going      PT LONG TERM GOAL #4   Title R grip 40  lbs. or greater at 3rd rep of grip dynamonometer testing to improve grip endurance for work duties    Baseline Pt  with R 26.5 grip, and L 25.6 grip 05-27-21 R 32 and L 36    Time 6    Period Weeks    Status On-going      PT LONG TERM GOAL #5   Title Perform gripping activities for cooking, opening jars, work duties with 50% or greater symptom reduction    Baseline Pt with grip weakness with opeing jars at home and caring for 10 month  old.  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    Time 6    Period Weeks    Status On-going                   Plan - 05/27/21 1320     Clinical Impression Statement Pt             Patient will benefit from skilled therapeutic intervention in order to improve the following deficits and impairments:     Visit Diagnosis: Pain in right arm  Pain in left upper arm  Cervicalgia  Muscle weakness (generalized)     Problem  List Patient Active Problem List   Diagnosis Date Noted   Overuse syndrome of hand 03/24/2021   Encounter for general adult medical examination with abnormal findings 03/24/2021   Iron deficiency anemia due to chronic blood loss 03/24/2021   Moderate persistent asthma without complication 11/14/2020   Normal labor and delivery 07/09/2020   Pregnancy 11/09/2019   Seasonal and perennial allergic rhinoconjunctivitis 09/11/2019   Pollen-food allergy 06/12/2019   Dyshidrotic eczema 06/12/2019   Family history of fragile X syndrome 03/25/2016   History of frequent upper respiratory infection 04/26/2012   MTHFR mutation 04/01/2012   Homozygous MTHFR mutation C677T 01/18/2012   Garen Lah, PT, ATRIC Certified Exercise Expert for the Aging Adult  05/27/21 2:03 PM Phone: [phone] Fax: [phone]   [facility] Outpatient Rehabilitation [facility] [address] [place], [place], [address] Phone: [phone]   Fax:  [phone]  Name: Kristina Stewart MRN: [id number] Date of Birth: 1986-03-17

## 2021-06-04 ENCOUNTER — Encounter: Payer: Self-pay | Admitting: Physical Therapy

## 2021-06-04 ENCOUNTER — Other Ambulatory Visit: Payer: Self-pay

## 2021-06-04 ENCOUNTER — Ambulatory Visit: Payer: No Typology Code available for payment source | Attending: Internal Medicine | Admitting: Physical Therapy

## 2021-06-04 DIAGNOSIS — M79601 Pain in right arm: Secondary | ICD-10-CM | POA: Diagnosis not present

## 2021-06-04 DIAGNOSIS — M6281 Muscle weakness (generalized): Secondary | ICD-10-CM | POA: Insufficient documentation

## 2021-06-04 DIAGNOSIS — M542 Cervicalgia: Secondary | ICD-10-CM | POA: Insufficient documentation

## 2021-06-04 DIAGNOSIS — M79622 Pain in left upper arm: Secondary | ICD-10-CM | POA: Insufficient documentation

## 2021-06-04 NOTE — Therapy (Signed)
[place], [place], [address] Phone: [phone]   Fax:  [phone]  Physical Therapy Treatment  Patient Details  Name: Kristina Stewart MRN: [id number] Date of Birth: 11-Nov-1985 Referring Provider (PT): Janith Lima MD   Encounter Date: 06/04/2021   PT End of Session - 06/04/21 1709     Visit Number 4    Number of Visits 6    Date for PT Re-Evaluation 06/26/21    Authorization Type [facility]    PT Start Time 1500    PT Stop Time 1555    PT Time Calculation (min) 55 min    Activity Tolerance Patient tolerated treatment well    Behavior During Therapy [facility] for tasks assessed/performed             Past Medical History:  Diagnosis Date   Allergy    Asthma    Family history of adverse reaction to anesthesia    sister problems with propofol   IBS (irritable bowel syndrome)    Obesity    Seasonal allergic rhinitis    Strep pharyngitis    TMJ (temporomandibular joint syndrome)    Vertigo     Past Surgical History:  Procedure Laterality Date   BRAIN SURGERY     craniotomy/arnold chiari malformation; craniotomy & laminectomy   BRAIN SURGERY     craniotomy/arnold chiari malformation    There were no vitals filed for this visit.    Subjective Assessment - 06/04/21 1507     Subjective Had a reaction to DN of trapezius region and was a little sore for 2-3 days, not enough ot limit activity.  Continues with paresthesias in B UE, symptoms begin centrally and progress peripherally.  Accepting of DN today and open to any treatment ideas.    Pertinent History TMJ,  craniotomy in 2006 Arnold Chiari malformation,    Limitations House hold activities    Diagnostic tests MRI normal, EMG/NCS all normal    Patient Stated Goals To get strength back in hands and reduce pain    Currently in Pain? Yes    Pain Score 2     Pain Location Shoulder    Pain Orientation Right;Left    Pain Descriptors /  Indicators Burning    Pain Type Chronic pain    Aggravating Factors  activities involving repetitive UE tasks    Pain Relieving Factors undetermined    Pain Onset More than a month ago            HEP reviewed for accuracy asking for 15 reps - Standing bil extension with RTB using cardboard cylinders for grips 15x - scapular retraction/ row with RTB 15x - External Rotation bil with RTB 15x - Seated Wrist Extension with Dumbbell 2#  R and L15x - Seated Wrist Flexion with Dumbbell 2#  R and L 15x - Seated Wrist Radial Deviation with Dumbbell 15x 2# R and L  Performed gentle inferior mobilization of B first ribs as well TP release to B pec minors, trial of MET with first rib depression and concurrent shoulder elevation 5x B with improved symptoms post intervention.  DN of B pronators as well as TP release to L scalene performed by Carlus Pavlov DPT certified in procedure.               Objective measurements completed on examination: See above findings.       [place] Adult PT Treatment/Exercise - 06/04/21 0001  Posture/Postural Control   Posture/Postural Control Postural limitations    Postural Limitations Forward head;Rounded Shoulders    Posture Comments mild forward head                     PT Education - 06/04/21 1708     Education Details Discussed DN options, treatment for BPPV as well as first rib dysfunction    Person(s) Educated Patient    Methods Explanation    Comprehension Verbalized understanding;Returned demonstration              PT Short Term Goals - 05/13/21 0843       PT SHORT TERM GOAL #1   Title STG=LTG               PT Long Term Goals - 05/27/21 1357       PT LONG TERM GOAL #1   Title Pt will be independent with advanced HEP    Time 6    Period Weeks    Status On-going      PT LONG TERM GOAL #2   Title Improve FOTO intake to 67%    Baseline FOTO eval 55%    Time 6    Period Weeks    Status Unable to  assess      PT LONG TERM GOAL #3   Title Increase left cervical rotation 5-10 deg to improve ability to turn head while driving    Baseline L rotation 40, R 60    Time 6    Period Weeks    Status On-going      PT LONG TERM GOAL #4   Title R grip 40  lbs. or greater at 3rd rep of grip dynamonometer testing to improve grip endurance for work duties    Baseline Pt  with R 26.5 grip, and L 25.6 grip 05-27-21 R 32 and L 36    Time 6    Period Weeks    Status On-going      PT LONG TERM GOAL #5   Title Perform gripping activities for cooking, opening jars, work duties with 50% or greater symptom reduction    Baseline Pt with grip weakness with opeing jars at home and caring for 86 month old.  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  As this was the fisr session with this PT, a brief assesment of cervical and upper thoracic spine was performed.  Patient presented with marked soft Stewart tghtness throughout but particular restrictions in levators and anterior scalenes.  Cervical mobility eassentially full in F/E and B rot.  Combination of cervical rotation and SB restricted B suggesting first rib dysfunction B, palpation found restrictions to inferior first rib mobilization and a brief MET to first rib with shoulder elevaton offered relief.  HEP reviwed for accuracy and DN performed by Carlus Pavlov while treating therapist observed.    Personal Factors and Comorbidities Comorbidity 1;Comorbidity 2    Comorbidities TMJ,  craniotomy in 2006 Arnold Chiari malformation,    Examination-Activity Limitations Carry;Dressing;Caring for Others    Examination-Participation Restrictions Occupation;Other    PT Frequency 2x / week  PT Duration 6 weeks    PT  Treatment/Interventions ADLs/Self Care Home Management;Cryotherapy;Electrical Stimulation;Iontophoresis 28m/ml Dexamethasone;Moist Heat;Traction;Therapeutic exercise;Neuromuscular re-education;Patient/family education;Manual techniques;Passive range of motion;Dry needling;Taping;Joint Manipulations;Spinal Manipulations    PT Next Visit Plan e stim to medial elbows, TPDN  grip strength and UE strength Make HEP  sleep hygiene, assess benefit of first rib mobilizations    PT Home Exercise Plan [phone]   Consulted and Agree with Plan of Care Patient             Patient will benefit from skilled therapeutic intervention in order to improve the following deficits and impairments:  Pain, Improper body mechanics, Postural dysfunction, Impaired UE functional use, Impaired sensation, Decreased strength, Decreased range of motion  Visit Diagnosis: Pain in right arm  Pain in left upper arm  Cervicalgia     Problem List Patient Active Problem List   Diagnosis Date Noted   Overuse syndrome of hand 03/24/2021   Encounter for general adult medical examination with abnormal findings 03/24/2021   Iron deficiency anemia due to chronic blood loss 03/24/2021   Moderate persistent asthma without complication 037/94/4461  Normal labor and delivery 07/09/2020   Pregnancy 11/09/2019   Seasonal and perennial allergic rhinoconjunctivitis 09/11/2019   Pollen-food allergy 06/12/2019   Dyshidrotic eczema 06/12/2019   Family history of fragile X syndrome 03/25/2016   History of frequent upper respiratory infection 04/26/2012   MTHFR mutation 04/01/2012   Homozygous MTHFR mutation C677T 01/18/2012    JLanice Shirts PT 06/04/2021, 5:35 PM  [facility]GC[place] [address]Phone: [phone]  Fax:  [phone] Name: MTranisha TissueMRN: [id number] of Birth: [birth year]

## 2021-06-05 ENCOUNTER — Ambulatory Visit (INDEPENDENT_AMBULATORY_CARE_PROVIDER_SITE_OTHER): Payer: No Typology Code available for payment source

## 2021-06-05 DIAGNOSIS — J309 Allergic rhinitis, unspecified: Secondary | ICD-10-CM | POA: Diagnosis not present

## 2021-06-18 ENCOUNTER — Ambulatory Visit: Payer: No Typology Code available for payment source | Admitting: Physical Therapy

## 2021-06-18 ENCOUNTER — Other Ambulatory Visit: Payer: Self-pay

## 2021-06-18 ENCOUNTER — Other Ambulatory Visit (HOSPITAL_COMMUNITY): Payer: Self-pay

## 2021-06-18 DIAGNOSIS — M6281 Muscle weakness (generalized): Secondary | ICD-10-CM

## 2021-06-18 DIAGNOSIS — M79601 Pain in right arm: Secondary | ICD-10-CM | POA: Diagnosis not present

## 2021-06-18 DIAGNOSIS — M542 Cervicalgia: Secondary | ICD-10-CM

## 2021-06-18 NOTE — Therapy (Addendum)
Kristina Stewart, [place], [address] Phone: [phone]   Fax:  [phone]  Physical Therapy Treatment/Discharge Note  Patient Details  Name: Kristina Stewart MRN: [id number] Date of Birth: [birth year] Referring Provider (PT): Janith Lima MD   Encounter Date: 06/18/2021   PT End of Session - 06/18/21 0929     Visit Number 5    Number of Visits 6    Date for PT Re-Evaluation 06/26/21    Authorization Type [facility]    PT Start Time 0930    PT Stop Time 1015    PT Time Calculation (min) 45 min    Activity Tolerance Patient tolerated treatment well    Behavior During Therapy [facility] for tasks assessed/performed             Past Medical History:  Diagnosis Date   Allergy    Asthma    Family history of adverse reaction to anesthesia    sister problems with propofol   IBS (irritable bowel syndrome)    Obesity    Seasonal allergic rhinitis    Strep pharyngitis    TMJ (temporomandibular joint syndrome)    Vertigo     Past Surgical History:  Procedure Laterality Date   BRAIN SURGERY     craniotomy/arnold chiari malformation; craniotomy & laminectomy   BRAIN SURGERY     craniotomy/arnold chiari malformation    There were no vitals filed for this visit.   Subjective Assessment - 06/18/21 0931     Subjective "I felt wonderfull after the last session, It was the most relief i've had in a while. I just got back from [place] last week, and didn't have any issues with my pain."                               [facility] Adult PT Treatment:     DATE: 06/18/2021  Therapeutic Exercise: Reviewed and updated HEP Upper trap/ levator scapulae stretch 1 x 30 sec Supine chin tuck head lift 2 x 10 Self mob for first rib  Manual Therapy:  Bil first rib mobs Taught how to perform at home Manual grade II cervical traction Sub-occipital release MTPR along the upper trap Reviewed how to use  theracane to assist with treatment Self Care: Reviewed cervical posture and positioning, and avoiding rolling the shoulders to reduce upper trap over activation.             PT Short Term Goals - 05/13/21 0843       PT SHORT TERM GOAL #1   Title STG=LTG               PT Long Term Goals - 05/27/21 1357       PT LONG TERM GOAL #1   Title Pt will be independent with advanced HEP    Time 6    Period Weeks    Status On-going      PT LONG TERM GOAL #2   Title Improve FOTO intake to 67%    Baseline FOTO eval 55%    Time 6    Period Weeks    Status Unable to assess      PT LONG TERM GOAL #3   Title Increase left cervical rotation 5-10 deg to improve ability to turn head while driving    Baseline L rotation 40, R 60    Time 6    Period Weeks  Status On-going      PT LONG TERM GOAL #4   Title R grip 40  lbs. or greater at 3rd rep of grip dynamonometer testing to improve grip endurance for work duties    Baseline Pt  with R 26.5 grip, and L 25.6 grip 05-27-21 R 32 and L 36    Time 6    Period Weeks    Status On-going      PT LONG TERM GOAL #5   Title Perform gripping activities for cooking, opening jars, work duties with 50% or greater symptom reduction    Baseline Pt with grip weakness with opeing jars at home and caring for 58 month old.  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 Continued focus on Manual trigger point release techiques and sub-occipital release which she continues to respond favorabilty with as well as rib mobs and cervical stability / strengthening. End of session she noted continued reduction in pain/ soreness.    PT Treatment/Interventions ADLs/Self Care Home  Management;Cryotherapy;Electrical Stimulation;Iontophoresis 63m/ml Dexamethasone;Moist Heat;Traction;Therapeutic exercise;Neuromuscular re-education;Patient/family education;Manual techniques;Passive range of motion;Dry needling;Taping;Joint Manipulations;Spinal Manipulations    PT Next Visit Plan e stim to medial elbows, TPDN  grip strength and UE strength Make HEP  sleep hygiene, assess benefit of first rib mobilizations    PT Home Exercise Plan [phone]   Consulted and Agree with Plan of Care Patient             Patient will benefit from skilled therapeutic intervention in order to improve the following deficits and impairments:  Pain, Improper body mechanics, Postural dysfunction, Impaired UE functional use, Impaired sensation, Decreased strength, Decreased range of motion  Visit Diagnosis: Pain in right arm  Cervicalgia  Muscle weakness (generalized)     Problem List Patient Active Problem List   Diagnosis Date Noted   Overuse syndrome of hand 03/24/2021   Encounter for general adult medical examination with abnormal findings 03/24/2021   Iron deficiency anemia due to chronic blood loss 03/24/2021   Moderate persistent asthma without complication 051/70/0174  Normal labor and delivery 07/09/2020   Pregnancy 11/09/2019   Seasonal and perennial allergic rhinoconjunctivitis 09/11/2019   Pollen-food allergy 06/12/2019   Dyshidrotic eczema 06/12/2019   Family history of fragile X syndrome 03/25/2016   History of frequent upper respiratory infection 04/26/2012   MTHFR mutation 04/01/2012   Homozygous MTHFR mutation C677T 01/18/2012   Jonothan Heberle PT, DPT, LAT, ATC  06/18/21  11:14 AM     [facility] [address]Phone: [phone]  Fax:  [phone] Name: Kristina RaeMRN: [id number] of Birth: 203-20-1987 PHYSICAL THERAPY DISCHARGE SUMMARY  Visits from Start of  Care: 5  Current functional level related to goals / functional outcomes: Last known above   Remaining deficits: Last know above   Education / Equipment: Initial HEP   Patient agrees to discharge. Patient goals were partially met. Patient is being discharged due to not returning since the last visit.    LVoncille Lo PT, ACorunnaCertified Exercise Expert for the Aging Adult  08/14/21  8:06 AM Phone: [phone] Fax: [phone]

## 2021-06-24 ENCOUNTER — Ambulatory Visit: Payer: No Typology Code available for payment source | Admitting: Physical Therapy

## 2021-06-26 ENCOUNTER — Ambulatory Visit (INDEPENDENT_AMBULATORY_CARE_PROVIDER_SITE_OTHER): Payer: No Typology Code available for payment source | Admitting: *Deleted

## 2021-06-26 DIAGNOSIS — J309 Allergic rhinitis, unspecified: Secondary | ICD-10-CM

## 2021-07-02 ENCOUNTER — Other Ambulatory Visit (HOSPITAL_COMMUNITY): Payer: Self-pay

## 2021-07-03 ENCOUNTER — Other Ambulatory Visit (HOSPITAL_COMMUNITY): Payer: Self-pay

## 2021-07-03 ENCOUNTER — Telehealth: Payer: No Typology Code available for payment source | Admitting: Physician Assistant

## 2021-07-03 DIAGNOSIS — B9689 Other specified bacterial agents as the cause of diseases classified elsewhere: Secondary | ICD-10-CM | POA: Diagnosis not present

## 2021-07-03 DIAGNOSIS — J019 Acute sinusitis, unspecified: Secondary | ICD-10-CM | POA: Diagnosis not present

## 2021-07-04 ENCOUNTER — Other Ambulatory Visit (HOSPITAL_COMMUNITY): Payer: Self-pay

## 2021-07-04 ENCOUNTER — Encounter: Payer: Self-pay | Admitting: Physician Assistant

## 2021-07-04 MED ORDER — DOXYCYCLINE HYCLATE 100 MG PO TABS
100.0000 mg | ORAL_TABLET | Freq: Two times a day (BID) | ORAL | 0 refills | Status: DC
  Filled 2021-07-04: qty 20, 10d supply, fill #0

## 2021-07-04 NOTE — Progress Notes (Signed)

## 2021-07-13 NOTE — Progress Notes (Signed)
Follow Up Note  RE: Kristina Stewart Azpeitia MRN: [id number] DOB: 1986-02-28 Date of Office Visit: 07/14/2021  Referring provider: Margaree MackintoshBaxley, Mary J, MD Primary care provider: Etta GrandchildJones, Thomas L, MD  Chief Complaint: Asthma (Symptomatic but doing better with the routine that she is on. Weather change has an effect on it as well./ACT 19) and Allergic Rhinitis  (Constant post nasal drip which flares Asthma)  History of Present Illness: I had the pleasure of seeing Kristina Stewart Stucky for a follow up visit at the [facility] on 07/14/2021. She is a 36 y.o. female, who is being followed for asthma, allergic rhinoconjunctivitis on AIT, oral allergy syndrome, dyshidrotic eczema, history of frequent upper respiratory infections. Her previous allergy office visit was on 02/27/2021 with Dr. Selena BattenKim. Today is a regular follow up visit.  Moderate persistent asthma The past 1 month patient's asthma has been acting up. [place] in [place] which triggered her allergies and asthma.  She has been having PND which is triggering her chest/asthma.  Currently taking Advair 500mcg 1 puff twice a day and Singulair daily. Using albuterol nebulizer once per week with good benefit. Using mucinex as needed.  No fevers/chills.  Denies any ER/urgent care visits or prednisone use since the last visit.  Seasonal and perennial allergic rhinoconjunctivitis Currently taking xyzal, Singulair, dymista at night. Taking allegra, dymista in the morning.  Dymista helps. Using Atrovent as needed with good benefit.  Questions if she can come in to get her injections when her asthma is not under good control. She had to miss a few weeks due to this.   Infection  Currently on doxycycline with some benefit. No prednisone with this.  She didn't get the bloodwork drawn yet.  Dyshidrotic eczema Using triamcinolone and Eucrisa as needed.   Assessment and Plan: Marcelino DusterMichelle is a 36 y.o. female with: Moderate  persistent asthma without complication Past history - Diagnosed with asthma in middle school and was doing well up until a few years ago.  Main triggers include allergies, weather change, infection, strong scents and perfumes.  During these flares she takes Advair 250 1 puff twice a day, Singulair 10 mg daily and albuterol as needed with good benefit. 2020 spirometry was normal. Interim history - increased symptoms x 1 month.  Today's spirometry was normal but not as good as prior one.  Read about Dupixent injections - approved for asthma and eczema, handout given.  Daily controller medication(s):  START Trelegy 200mcg 1 puff once a day and rinse mouth after each use. Sample given. This replaces Advair.  May use albuterol rescue inhaler 2 puffs or nebulizer every 4 to 6 hours as needed for shortness of breath, chest tightness, coughing, and wheezing. May use albuterol rescue inhaler 2 puffs 5 to 15 minutes prior to strenuous physical activities. Monitor frequency of use.  Get spirometry at next visit.  Seasonal and perennial allergic rhinoconjunctivitis Past history - Perennial rhinoconjunctivitis symptoms for the last 10+ years with worsening during change of season. 2020 skin testing showed: Positive to grass, weed, ragweed, trees, mold. Started AIT on 07/06/2019 (MOLD, G-W-RW-T) and stopped due to pregnancy. Interim history - restarted injections. Having PND. Continue environmental control measures.  Start Ryaltris (olopatadine + mometasone nasal spray combination) 1-2 sprays per nostril twice a day. Sample given. This replaces dymista. Use Atrovent (ipratropium) 0.03% 1-2 sprays per nostril twice a day as needed for runny nose/drainage. Nasal saline spray (i.e., Simply Saline) or nasal saline lavage (i.e., NeilMed) is  recommended as needed and prior to medicated nasal sprays. Use over the counter antihistamines such as Zyrtec (cetirizine), Claritin (loratadine), Allegra (fexofenadine), or Xyzal  (levocetirizine) daily as needed. May take twice a day during allergy flares. May switch antihistamines every few months. Continue Singulair (montelukast) 10mg  daily at night. Continue allergy injections - okay to get as long as you didn't use your albuterol the day before or the day of injection.   History of frequent upper respiratory infection Past history - 4 sinus infections per year. Wants to know if she needs a pneumonia vaccine. Interim history - didn't get bloodwork yet. Currently on doxy. Keep track of infections and antibiotics use. Get bloodwork to look at immune system when not feeling ill.   Dyshidrotic eczema Past history - Dyshidrotic eczema on the palms bilaterally.  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   Interim history - waxes and wanes. Use triamcinolone 0.1% ointment twice a day as needed for eczema flares. Do not use on the face, neck, armpits or groin area. Do not use more than 3 weeks in a row.  Use Eucrisa (crisaborole) 2% ointment twice a day on mild rash flares on the face and body. This is a non-steroid ointment.  Continue proper skin care.  Pollen-food allergy Past history - Certain raw fruits and almonds cause perioral pruritus.  Tolerates processed and cooked forms with no issues.  Patient questionably had a reaction to pistachios but it was cross contaminated almonds in the past.  She tolerates other tree nuts including cashews, walnuts, pecans and hazelnuts with no issues. 2020 skin testing was borderline positive to almonds and negative pistachios. Okay with pistachios and nectarines.  May try almonds at home in little doses. For mild symptoms you can take over the counter antihistamines such as Benadryl and monitor symptoms closely. If symptoms worsen or if you have severe symptoms including breathing issues, throat closure, significant swelling, whole body hives, severe diarrhea and vomiting, lightheadedness then  inject epinephrine and seek immediate medical care afterwards.  Return in about 4 months (around 11/11/2021).  Meds ordered this encounter  Medications   Fluticasone-Umeclidin-Vilant (TRELEGY ELLIPTA) 200-62.5-25 MCG/ACT AEPB    Sig: Inhale 1 puff into the lungs daily. Rinse mouth after each use    Dispense:  60 each    Refill:  5   RYALTRIS 665-25 MCG/ACT SUSP    Sig: Place 1-2 sprays into the nose in the morning and at bedtime.    Dispense:  29 g    Refill:  5   Lab Orders  No laboratory test(s) ordered today    Diagnostics: Spirometry:  Tracings reviewed. Her effort: Good reproducible efforts. FVC: 4.01L FEV1: 3.44L, 106% predicted FEV1/FVC ratio: 86% Interpretation: Spirometry consistent with normal pattern.  Please see scanned spirometry results for details.  Medication List:  Current Outpatient Medications  Medication Sig Dispense Refill   albuterol (PROVENTIL) (2.5 MG/3ML) 0.083% nebulizer solution Take 3 mLs (2.5 mg total) by nebulization every 6 (six) hours as needed for wheezing or shortness of breath (coughing fits). 75 mL 2   albuterol (VENTOLIN HFA) 108 (90 Base) MCG/ACT inhaler Inhale 2 puffs into the lungs every 6 (six) hours as needed for wheezing or shortness of breath. 6.7 g prn   Crisaborole (EUCRISA) 2 % OINT Apply 1 application topically 2 (two) times daily as needed. 100 g 5   doxycycline (VIBRA-TABS) 100 MG tablet Take 1 tablet (100 mg total) by mouth 2 (two)  times daily. 20 tablet 0   etonogestrel-ethinyl estradiol (NUVARING) 0.12-0.015 MG/24HR vaginal ring INSERT 1 RING VAGINALLY EVERY MONTH (Patient taking differently: INSERT 1 RING VAGINALLY EVERY MONTH) 3 each 3   fexofenadine (ALLEGRA) 180 MG tablet Take 180 mg by mouth daily.     Fluticasone-Umeclidin-Vilant (TRELEGY ELLIPTA) 200-62.5-25 MCG/ACT AEPB Inhale 1 puff into the lungs daily. Rinse mouth after each use 60 each 5   ipratropium (ATROVENT) 0.03 % nasal spray Place 1-2 sprays into both nostrils  2 (two) times daily as needed (nasal drainage). 30 mL 5   ipratropium-albuterol (DUONEB) 0.5-2.5 (3) MG/3ML SOLN Take 3 mLs by nebulization every 4 (four) hours as needed (coughing fits, shortness of breath, wheezing). 180 mL 2   levocetirizine (XYZAL) 5 MG tablet Take 5 mg by mouth every evening.     montelukast (SINGULAIR) 10 MG tablet Take 1 tablet (10 mg total) by mouth at bedtime. 90 tablet 2   Multiple Vitamin (MULTIVITAMIN PO) Take by mouth.     olopatadine (PATADAY) 0.1 % ophthalmic solution 1 drop 2 (two) times daily.     RYALTRIS [id number] MCG/ACT SUSP Place 1-2 sprays into the nose in the morning and at bedtime. 29 g 5   sertraline (ZOLOFT) 25 MG tablet Take 1 tablet by mouth daily. 30 tablet 2   triamcinolone ointment (KENALOG) 0.1 % Apply 1 application topically 2 (two) times daily. 30 g 2   COVID-19 mRNA bivalent vaccine, Pfizer, (PFIZER COVID-19 VAC BIVALENT) injection Inject into the muscle. 0.3 mL 0   No current facility-administered medications for this visit.   Allergies: Allergies  Allergen Reactions   Latex Rash   Trimethoprim    Almond (Diagnostic) Itching   Other Itching    PT IS ALLERGIC PITTED FRUITS   I reviewed her past medical history, social history, family history, and environmental history and no significant changes have been reported from her previous visit.  Review of Systems  Constitutional:  Negative for appetite change, chills, fever and unexpected weight change.  HENT:  Positive for congestion, postnasal drip and sinus pressure. Negative for rhinorrhea and sneezing.   Eyes:  Negative for itching.  Respiratory:  Positive for cough. Negative for chest tightness, shortness of breath and wheezing.   Cardiovascular:  Negative for chest pain.  Gastrointestinal:  Negative for abdominal pain.  Genitourinary:  Negative for difficulty urinating.  Skin:  Positive for rash.  Allergic/Immunologic: Positive for environmental allergies and food allergies.   Neurological:  Positive for headaches.   Objective: BP 100/70 (Cuff Size: Large)    Pulse 75    Temp (!) 97.2 F (36.2 C) (Temporal)    Resp 16    Ht 5\' 5"  (1.651 m)    Wt 242 lb 6.4 oz (110 kg)    LMP 03/11/2021 (Exact Date)    SpO2 99%    BMI 40.34 kg/m  Body mass index is 40.34 kg/m. Physical Exam Vitals and nursing note reviewed.  Constitutional:      Appearance: Normal appearance. She is well-developed.  HENT:     Head: Normocephalic and atraumatic.     Right Ear: Tympanic membrane and external ear normal.     Left Ear: Tympanic membrane and external ear normal.     Nose: Nose normal.     Mouth/Throat:     Mouth: Mucous membranes are moist.     Pharynx: Oropharynx is clear.  Eyes:     Conjunctiva/sclera: Conjunctivae normal.  Cardiovascular:     Rate and Rhythm: Normal  rate and regular rhythm.     Heart sounds: Normal heart sounds. No murmur heard. Pulmonary:     Effort: Pulmonary effort is normal.     Breath sounds: Normal breath sounds. No wheezing, rhonchi or rales.  Musculoskeletal:     Cervical back: Neck supple.  Skin:    General: Skin is warm.     Findings: Rash present.     Comments: Dry hands bilaterally with few areas of skin fissures.  Neurological:     Mental Status: She is alert and oriented to person, place, and time.  Psychiatric:        Behavior: Behavior normal.   Previous notes and tests were reviewed. The plan was reviewed with the patient/family, and all questions/concerned were addressed.  It was my pleasure to see Stephaie today and participate in her care. Please feel free to contact me with any questions or concerns.  Sincerely,  Wyline Mood, DO Allergy & Immunology  [facility] office: [phone] [facility] office: [phone]

## 2021-07-14 ENCOUNTER — Encounter: Payer: Self-pay | Admitting: Allergy

## 2021-07-14 ENCOUNTER — Other Ambulatory Visit: Payer: Self-pay

## 2021-07-14 ENCOUNTER — Other Ambulatory Visit (HOSPITAL_COMMUNITY): Payer: Self-pay

## 2021-07-14 ENCOUNTER — Ambulatory Visit (INDEPENDENT_AMBULATORY_CARE_PROVIDER_SITE_OTHER): Payer: No Typology Code available for payment source | Admitting: Allergy

## 2021-07-14 VITALS — BP 100/70 | HR 75 | Temp 97.2°F | Resp 16 | Ht 65.0 in | Wt 242.4 lb

## 2021-07-14 DIAGNOSIS — Z8709 Personal history of other diseases of the respiratory system: Secondary | ICD-10-CM

## 2021-07-14 DIAGNOSIS — H1013 Acute atopic conjunctivitis, bilateral: Secondary | ICD-10-CM

## 2021-07-14 DIAGNOSIS — L301 Dyshidrosis [pompholyx]: Secondary | ICD-10-CM

## 2021-07-14 DIAGNOSIS — H1010 Acute atopic conjunctivitis, unspecified eye: Secondary | ICD-10-CM

## 2021-07-14 DIAGNOSIS — T781XXD Other adverse food reactions, not elsewhere classified, subsequent encounter: Secondary | ICD-10-CM

## 2021-07-14 DIAGNOSIS — J4540 Moderate persistent asthma, uncomplicated: Secondary | ICD-10-CM | POA: Diagnosis not present

## 2021-07-14 DIAGNOSIS — J302 Other seasonal allergic rhinitis: Secondary | ICD-10-CM

## 2021-07-14 MED ORDER — RYALTRIS 665-25 MCG/ACT NA SUSP: 1.0000 | Freq: Two times a day (BID) | NASAL | 5 refills | Status: DC

## 2021-07-14 MED ORDER — TRELEGY ELLIPTA 200-62.5-25 MCG/ACT IN AEPB
1.0000 | INHALATION_SPRAY | Freq: Every day | RESPIRATORY_TRACT | 5 refills | Status: DC
Start: 2021-07-14 — End: 2022-04-15
  Filled 2021-07-14 – 2021-08-25 (×2): qty 60, 30d supply, fill #0
  Filled 2021-10-06: qty 60, 30d supply, fill #1
  Filled 2021-12-04: qty 60, 30d supply, fill #2
  Filled 2022-01-04: qty 60, 30d supply, fill #3
  Filled 2022-02-03: qty 60, 30d supply, fill #4
  Filled 2022-03-11: qty 60, 30d supply, fill #5

## 2021-07-14 NOTE — Patient Instructions (Addendum)
Read about Dupixent injections - approved for asthma and eczema.  Asthma Daily controller medication(s):  START Trelegy 222mcg 1 puff once a day and rinse mouth after each use. Sample given. This replaces Advair.  May use albuterol rescue inhaler 2 puffs or nebulizer every 4 to 6 hours as needed for shortness of breath, chest tightness, coughing, and wheezing. May use albuterol rescue inhaler 2 puffs 5 to 15 minutes prior to strenuous physical activities. Monitor frequency of use.  Asthma control goals:  Full participation in all desired activities (may need albuterol before activity) Albuterol use two times or less a week on average (not counting use with activity) Cough interfering with sleep two times or less a month Oral steroids no more than once a year No hospitalizations    Allergic conjunctivitis Past skin testing showed: Positive to grass, weed, ragweed, trees, mold.  Continue environmental control measures.  Start Ryaltris (olopatadine + mometasone nasal spray combination) 1-2 sprays per nostril twice a day. Sample given. This replaces dymista. Use Atrovent (ipratropium) 0.03% 1-2 sprays per nostril twice a day as needed for runny nose/drainage. Nasal saline spray (i.e., Simply Saline) or nasal saline lavage (i.e., NeilMed) is recommended as needed and prior to medicated nasal sprays. Use over the counter antihistamines such as Zyrtec (cetirizine), Claritin (loratadine), Allegra (fexofenadine), or Xyzal (levocetirizine) daily as needed. May take twice a day during allergy flares. May switch antihistamines every few months. Continue Singulair (montelukast) 10mg  daily at night. Continue allergy injections - okay to get as long as you didn't use your albuterol the day before or the day of injection.    Pollen-food allergy May try almonds at home in little doses. For mild symptoms you can take over the counter antihistamines such as Benadryl and monitor symptoms closely. If symptoms  worsen or if you have severe symptoms including breathing issues, throat closure, significant swelling, whole body hives, severe diarrhea and vomiting, lightheadedness then inject epinephrine and seek immediate medical care afterwards.   Dyshidrotic eczema Use triamcinolone 0.1% ointment twice a day as needed for eczema flares. Do not use on the face, neck, armpits or groin area. Do not use more than 3 weeks in a row.  Use Eucrisa (crisaborole) 2% ointment twice a day on mild rash flares on the face and body. This is a non-steroid ointment.  If it burns, place the medication in the refrigerator.  Apply a thin layer of moisturizer and then apply the Eucrisa on top of it. Continue proper skin care.  Infections Keep track of infections and antibiotics use. Get bloodwork when you are not ill. We are ordering labs, so please allow 1-2 weeks for the results to come back. With the newly implemented Cures Act, the labs might be visible to you at the same time that they become visible to me. However, I will not address the results until all of the results are back, so please be patient.   Follow up in 4 months or sooner if needed.

## 2021-07-14 NOTE — Assessment & Plan Note (Signed)
Past history - Certain raw fruits and almonds cause perioral pruritus.  Tolerates processed and cooked forms with no issues.  Patient questionably had a reaction to pistachios but it was cross contaminated almonds in the past.  She tolerates other tree nuts including cashews, walnuts, pecans and hazelnuts with no issues. 2020 skin testing was borderline positive to almonds and negative pistachios. Okay with pistachios and nectarines.   May try almonds at home in little doses.  For mild symptoms you can take over the counter antihistamines such as Benadryl and monitor symptoms closely. If symptoms worsen or if you have severe symptoms including breathing issues, throat closure, significant swelling, whole body hives, severe diarrhea and vomiting, lightheadedness then inject epinephrine and seek immediate medical care afterwards.

## 2021-07-14 NOTE — Assessment & Plan Note (Signed)
Past history - 4 sinus infections per year. Wants to know if she needs a pneumonia vaccine. Interim history - didn't get bloodwork yet. Currently on doxy.  Keep track of infections and antibiotics use.  Get bloodwork to look at immune system when not feeling ill.

## 2021-07-14 NOTE — Assessment & Plan Note (Signed)
Past history - Dyshidrotic eczema on the palms bilaterally.  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   Interim history - waxes and wanes.  Use triamcinolone 0.1% ointment twice a day as needed for eczema flares. Do not use on the face, neck, armpits or groin area. Do not use more than 3 weeks in a row.   Use Eucrisa (crisaborole) 2% ointment twice a day on mild rash flares on the face and body. This is a non-steroid ointment.   Continue proper skin care.

## 2021-07-14 NOTE — Assessment & Plan Note (Signed)
Past history - Perennial rhinoconjunctivitis symptoms for the last 10+ years with worsening during change of season. 2020 skin testing showed: Positive to grass, weed, ragweed, trees, mold. Started AIT on 07/06/2019 (MOLD, G-W-RW-T) and stopped due to pregnancy. Interim history - restarted injections. Having PND.  Continue environmental control measures.   Start Ryaltris (olopatadine + mometasone nasal spray combination) 1-2 sprays per nostril twice a day. Sample given. o This replaces dymista.  Use Atrovent (ipratropium) 0.03% 1-2 sprays per nostril twice a day as needed for runny nose/drainage.  Nasal saline spray (i.e., Simply Saline) or nasal saline lavage (i.e., NeilMed) is recommended as needed and prior to medicated nasal sprays.  Use over the counter antihistamines such as Zyrtec (cetirizine), Claritin (loratadine), Allegra (fexofenadine), or Xyzal (levocetirizine) daily as needed. May take twice a day during allergy flares. May switch antihistamines every few months.  Continue Singulair (montelukast) 10mg  daily at night.  Continue allergy injections - okay to get as long as you didn't use your albuterol the day before or the day of injection.

## 2021-07-14 NOTE — Assessment & Plan Note (Signed)
Past history - Diagnosed with asthma in middle school and was doing well up until a few years ago.  Main triggers include allergies, weather change, infection, strong scents and perfumes.  During these flares she takes Advair 250 1 puff twice a day, Singulair 10 mg daily and albuterol as needed with good benefit. 2020 spirometry was normal. Interim history - increased symptoms x 1 month.   Today's spirometry was normal but not as good as prior one.   Read about Dupixent injections - approved for asthma and eczema, handout given.   Daily controller medication(s):  o START Trelegy 1 puff once a day and rinse mouth after each use. Sample given. o This replaces Advair.   May use albuterol rescue inhaler 2 puffs or nebulizer every 4 to 6 hours as needed for shortness of breath, chest tightness, coughing, and wheezing. May use albuterol rescue inhaler 2 puffs 5 to 15 minutes prior to strenuous physical activities. Monitor frequency of use.   Get spirometry at next visit.

## 2021-07-18 ENCOUNTER — Other Ambulatory Visit: Payer: Self-pay

## 2021-07-18 ENCOUNTER — Other Ambulatory Visit (HOSPITAL_COMMUNITY): Payer: Self-pay

## 2021-07-18 MED ORDER — ETONOGESTREL-ETHINYL ESTRADIOL 0.12-0.015 MG/24HR VA RING
1.0000 | VAGINAL_RING | VAGINAL | 0 refills | Status: DC
  Filled 2021-07-18: qty 3, 84d supply, fill #0

## 2021-07-23 ENCOUNTER — Other Ambulatory Visit (HOSPITAL_COMMUNITY): Payer: Self-pay

## 2021-07-31 ENCOUNTER — Ambulatory Visit (INDEPENDENT_AMBULATORY_CARE_PROVIDER_SITE_OTHER): Payer: No Typology Code available for payment source

## 2021-07-31 DIAGNOSIS — J309 Allergic rhinitis, unspecified: Secondary | ICD-10-CM | POA: Diagnosis not present

## 2021-08-14 ENCOUNTER — Ambulatory Visit (INDEPENDENT_AMBULATORY_CARE_PROVIDER_SITE_OTHER): Payer: No Typology Code available for payment source | Admitting: *Deleted

## 2021-08-14 ENCOUNTER — Other Ambulatory Visit (HOSPITAL_COMMUNITY): Payer: Self-pay

## 2021-08-14 ENCOUNTER — Encounter: Payer: Self-pay | Admitting: Allergy

## 2021-08-14 DIAGNOSIS — J309 Allergic rhinitis, unspecified: Secondary | ICD-10-CM | POA: Diagnosis not present

## 2021-08-14 MED ORDER — MONTELUKAST SODIUM 10 MG PO TABS
10.0000 mg | ORAL_TABLET | Freq: Every day | ORAL | 5 refills | Status: DC
  Filled 2021-08-14: qty 30, 30d supply, fill #0
  Filled 2021-10-20: qty 90, 90d supply, fill #0
  Filled 2021-12-16 – 2022-01-14 (×2): qty 90, 90d supply, fill #1

## 2021-08-14 MED ORDER — ETONOGESTREL-ETHINYL ESTRADIOL 0.12-0.015 MG/24HR VA RING
VAGINAL_RING | VAGINAL | 0 refills | Status: DC
  Filled 2021-08-14 – 2021-10-06 (×2): qty 3, 84d supply, fill #0

## 2021-08-14 MED ORDER — SERTRALINE HCL 25 MG PO TABS
25.0000 mg | ORAL_TABLET | Freq: Every day | ORAL | 0 refills | Status: DC
  Filled 2021-08-14: qty 90, 90d supply, fill #0

## 2021-08-25 ENCOUNTER — Other Ambulatory Visit (HOSPITAL_COMMUNITY): Payer: Self-pay

## 2021-08-26 ENCOUNTER — Other Ambulatory Visit (HOSPITAL_COMMUNITY): Payer: Self-pay

## 2021-08-26 ENCOUNTER — Ambulatory Visit (INDEPENDENT_AMBULATORY_CARE_PROVIDER_SITE_OTHER): Payer: No Typology Code available for payment source

## 2021-08-26 DIAGNOSIS — J309 Allergic rhinitis, unspecified: Secondary | ICD-10-CM | POA: Diagnosis not present

## 2021-10-06 ENCOUNTER — Other Ambulatory Visit (HOSPITAL_COMMUNITY): Payer: Self-pay

## 2021-10-06 ENCOUNTER — Other Ambulatory Visit: Payer: Self-pay | Admitting: Allergy

## 2021-10-06 MED ORDER — RYALTRIS 665-25 MCG/ACT NA SUSP
NASAL | 5 refills | Status: DC
  Filled 2021-10-06: qty 29, 30d supply, fill #0

## 2021-10-09 ENCOUNTER — Other Ambulatory Visit: Payer: Self-pay | Admitting: *Deleted

## 2021-10-09 ENCOUNTER — Other Ambulatory Visit (HOSPITAL_COMMUNITY): Payer: Self-pay

## 2021-10-09 ENCOUNTER — Encounter: Payer: Self-pay | Admitting: Allergy

## 2021-10-09 MED ORDER — AZELASTINE-FLUTICASONE 137-50 MCG/ACT NA SUSP
1.0000 | Freq: Two times a day (BID) | NASAL | 5 refills | Status: DC
  Filled 2021-10-09: qty 23, 30d supply, fill #0
  Filled 2021-12-04: qty 23, 30d supply, fill #1
  Filled 2022-01-04: qty 23, 30d supply, fill #2
  Filled 2022-03-11: qty 23, 30d supply, fill #3
  Filled 2022-04-15: qty 23, 30d supply, fill #4
  Filled 2022-05-25: qty 23, 30d supply, fill #5

## 2021-10-17 ENCOUNTER — Other Ambulatory Visit (HOSPITAL_COMMUNITY): Payer: Self-pay

## 2021-10-17 MED ORDER — ETONOGESTREL-ETHINYL ESTRADIOL 0.12-0.015 MG/24HR VA RING
VAGINAL_RING | VAGINAL | 3 refills | Status: DC
  Filled 2021-10-17 – 2022-01-04 (×3): qty 3, 84d supply, fill #0
  Filled 2022-04-07: qty 3, 84d supply, fill #1
  Filled 2022-06-23: qty 3, 84d supply, fill #2
  Filled 2022-09-24: qty 3, 84d supply, fill #3

## 2021-10-17 MED ORDER — SERTRALINE HCL 25 MG PO TABS
25.0000 mg | ORAL_TABLET | Freq: Every day | ORAL | 3 refills | Status: DC
  Filled 2021-10-17 – 2021-12-16 (×2): qty 90, 90d supply, fill #0
  Filled 2022-03-11: qty 90, 90d supply, fill #1
  Filled 2022-06-23: qty 90, 90d supply, fill #2
  Filled 2022-09-24: qty 90, 90d supply, fill #3

## 2021-10-20 ENCOUNTER — Other Ambulatory Visit (HOSPITAL_COMMUNITY): Payer: Self-pay

## 2021-10-22 ENCOUNTER — Ambulatory Visit (INDEPENDENT_AMBULATORY_CARE_PROVIDER_SITE_OTHER): Payer: No Typology Code available for payment source | Admitting: *Deleted

## 2021-10-22 DIAGNOSIS — J309 Allergic rhinitis, unspecified: Secondary | ICD-10-CM

## 2021-10-27 DIAGNOSIS — J302 Other seasonal allergic rhinitis: Secondary | ICD-10-CM | POA: Diagnosis not present

## 2021-10-27 NOTE — Progress Notes (Signed)
VIALS EXP 10-28-22.  LAST SET TO BE MADE. ?

## 2021-10-28 DIAGNOSIS — J301 Allergic rhinitis due to pollen: Secondary | ICD-10-CM | POA: Diagnosis not present

## 2021-10-30 ENCOUNTER — Ambulatory Visit (INDEPENDENT_AMBULATORY_CARE_PROVIDER_SITE_OTHER): Payer: No Typology Code available for payment source

## 2021-10-30 DIAGNOSIS — J309 Allergic rhinitis, unspecified: Secondary | ICD-10-CM | POA: Diagnosis not present

## 2021-10-31 ENCOUNTER — Ambulatory Visit
Admission: EM | Admit: 2021-10-31 | Discharge: 2021-10-31 | Disposition: A | Discharge status: Home / Self Care | Payer: No Typology Code available for payment source | Attending: Student | Admitting: Student

## 2021-10-31 ENCOUNTER — Telehealth: Payer: No Typology Code available for payment source | Admitting: Family

## 2021-10-31 DIAGNOSIS — K529 Noninfective gastroenteritis and colitis, unspecified: Secondary | ICD-10-CM

## 2021-10-31 DIAGNOSIS — K921 Melena: Secondary | ICD-10-CM

## 2021-10-31 DIAGNOSIS — R197 Diarrhea, unspecified: Secondary | ICD-10-CM

## 2021-10-31 MED ORDER — CIPROFLOXACIN HCL 500 MG PO TABS: 500.0000 mg | ORAL_TABLET | Freq: Two times a day (BID) | ORAL | 0 refills | Status: AC

## 2021-10-31 MED ORDER — METRONIDAZOLE 500 MG PO TABS: 500.0000 mg | ORAL_TABLET | Freq: Two times a day (BID) | ORAL | 0 refills | Status: DC

## 2021-10-31 NOTE — Progress Notes (Signed)
Based on what you shared with me, I feel your condition warrants further evaluation and I recommend that you be seen in a face to face visit. ? ?Given your symptoms, you need to be seen in person for further testing to rule out a more serious infection.  ?  ?NOTE: There will be NO CHARGE for this eVisit ?  ?If you are having a true medical emergency please call 911.   ?  ? For an urgent face to face visit, [facility] has six urgent care centers for your convenience:  ?  ? [facility] ?Get Driving Directions ?[phone] ?[address] ?[place], [place] [address] ?  ? [facility]) ?Get Driving Directions ?[phone] ?[address] ?[place], [place] [address] ? ?[facility] [place] [facility] ?Get Driving Directions ?[address] [place],  [place]  [address] ? ?[facility] ?Get Driving Directions ?[phone] ?[address], [address] ?[place], [place] [address] ?  ?[facility] [place] ?Get Driving Directions  ?[phone] ?[address] ?[address] ?[place], [place] [address] ?  ?[facility] ?Get Driving Directions ?[phone] ?[address] ?[place], [place] [address] ? ?Your MyChart E-visit questionnaire answers were reviewed by a board certified advanced clinical practitioner to complete your personal care plan based on your specific symptoms.  Thank you for using e-Visits. ?  ? ?

## 2021-10-31 NOTE — ED Provider Notes (Addendum)
[facility]    CSN: [id number] Arrival date & time: 10/31/21  0813      History   Chief Complaint Chief Complaint  Patient presents with   Abdominal Pain   Diarrhea    HPI Kristina Stewart is a 36 y.o. female presenting with abdominal pain and diarrhea that began last night.  History of colitis about 10 years ago per patient this was related to food poisoning.  She does not have any chronic GI conditions, and denies history of abdominal procedures or surgeries.  States about 10 hours ago, sudden onset of watery diarrhea and lower crampy abdominal pain.  There were few episodes of hematochezia.  States she took 1 dose of Imodium, which improved the symptoms.  She does note eating out few hours before this began, though her family members are feeling fine.  Nausea without vomiting. Still passing gas. Denies recent ibuprofen, excessive etoh, etc.   HPI  Past Medical History:  Diagnosis Date   Allergy    Asthma    Family history of adverse reaction to anesthesia    sister problems with propofol   IBS (irritable bowel syndrome)    Obesity    Seasonal allergic rhinitis    Strep pharyngitis    TMJ (temporomandibular joint syndrome)    Vertigo     Patient Active Problem List   Diagnosis Date Noted   Overuse syndrome of hand 03/24/2021   Encounter for general adult medical examination with abnormal findings 03/24/2021   Iron deficiency anemia due to chronic blood loss 03/24/2021   Moderate persistent asthma without complication 11/14/2020   Normal labor and delivery 07/09/2020   Pregnancy 11/09/2019   Seasonal and perennial allergic rhinoconjunctivitis 09/11/2019   Pollen-food allergy 06/12/2019   Dyshidrotic eczema 06/12/2019   Family history of fragile X syndrome 03/25/2016   History of frequent upper respiratory infection 04/26/2012   MTHFR mutation 04/01/2012   Homozygous MTHFR mutation [id number] 01/18/2012    Past Surgical History:  Procedure Laterality Date    BRAIN SURGERY     craniotomy/arnold chiari malformation; craniotomy & laminectomy   BRAIN SURGERY     craniotomy/arnold chiari malformation    OB History     Gravida  4   Para  2   Term  2   Preterm      AB  1   Living  2      SAB  1   IAB      Ectopic      Multiple  0   Live Births  2            Home Medications    Prior to Admission medications   Medication Sig Start Date End Date Taking? Authorizing Provider  ciprofloxacin (CIPRO) 500 MG tablet Take 1 tablet (500 mg total) by mouth 2 (two) times daily for 7 days. 10/31/21 11/07/21 Yes Rhys Martini, PA-C  metroNIDAZOLE (FLAGYL) 500 MG tablet Take 1 tablet (500 mg total) by mouth 2 (two) times daily. 10/31/21  Yes Rhys Martini, PA-C  albuterol (PROVENTIL) (2.5 MG/3ML) 0.083% nebulizer solution Take 3 mLs (2.5 mg total) by nebulization every 6 (six) hours as needed for wheezing or shortness of breath (coughing fits). 02/27/21   Ellamae Sia, DO  albuterol (VENTOLIN HFA) 108 (90 Base) MCG/ACT inhaler Inhale 2 puffs into the lungs every 6 (six) hours as needed for wheezing or shortness of breath. 02/02/19   Margaree Mackintosh, MD  Azelastine-Fluticasone [phone] MCG/ACT SUSP Place  1 spray into the nose in the morning and at bedtime. 10/09/21   Marcelyn Bruins, MD  COVID-19 mRNA bivalent vaccine, Pfizer, (PFIZER COVID-19 VAC BIVALENT) injection Inject into the muscle. 05/06/21   Judyann Munson, MD  Crisaborole (EUCRISA) 2 % OINT Apply 1 application topically 2 (two) times daily as needed. 06/12/19   Ellamae Sia, DO  etonogestrel-ethinyl estradiol (NUVARING) 0.12-0.015 MG/24HR vaginal ring INSERT 1 RING VAGINALLY EVERY MONTH Patient taking differently: INSERT 1 RING VAGINALLY EVERY MONTH 08/22/20 08/22/21  Ranae Pila, MD  etonogestrel-ethinyl estradiol (NUVARING) 0.12-0.015 MG/24HR vaginal ring Insert 1 ring vaginally every month 10/17/21     fexofenadine (ALLEGRA) 180 MG tablet Take 180 mg by mouth daily.     [provider]  Fluticasone-Umeclidin-Vilant (TRELEGY ELLIPTA) 200-62.5-25 MCG/ACT AEPB Inhale 1 puff into the lungs daily. Rinse mouth after each use 07/14/21   Ellamae Sia, DO  ipratropium (ATROVENT) 0.03 % nasal spray Place 1-2 sprays into both nostrils 2 (two) times daily as needed (nasal drainage). 11/14/20   Ellamae Sia, DO  ipratropium-albuterol (DUONEB) 0.5-2.5 (3) MG/3ML SOLN Take 3 mLs by nebulization every 4 (four) hours as needed (coughing fits, shortness of breath, wheezing). 02/27/21   Ellamae Sia, DO  levocetirizine (XYZAL) 5 MG tablet Take 5 mg by mouth every evening.    [provider]  montelukast (SINGULAIR) 10 MG tablet Take 1 tablet (10 mg total) by mouth at bedtime. 08/14/21   Ellamae Sia, DO  Multiple Vitamin (MULTIVITAMIN PO) Take by mouth.    [provider]  olopatadine (PATADAY) 0.1 % ophthalmic solution 1 drop 2 (two) times daily.    [provider]  Olopatadine-Mometasone Cristal Generous) [phone] MCG/ACT SUSP Use 1 to 2 sprays in each nostril 2 times daily 07/14/21     RYALTRIS 665-25 MCG/ACT SUSP Place 1-2 sprays into the nose in the morning and at bedtime. 07/14/21   Ellamae Sia, DO  sertraline (ZOLOFT) 25 MG tablet Take 1 tablet (25 mg total) by mouth daily. 10/17/21     triamcinolone ointment (KENALOG) 0.1 % Apply 1 application topically 2 (two) times daily. 09/11/19   Ellamae Sia, DO    Family History Family History  Problem Relation Age of Onset   Hypertension Mother    Thyroid disease Mother    Hyperlipidemia Mother    Neuropathy Mother    Hypertension Father    Irritable bowel syndrome Father    Hyperlipidemia Father    GER disease Sister    Fragile X syndrome Sister    Alzheimer's disease Paternal Grandfather    Diabetes Paternal Grandmother    Fibromyalgia Paternal Grandmother    Thyroid disease Maternal Grandmother        HYPOTHYROID   Hypertension Maternal Grandmother    Cancer Maternal Grandmother        LUNG   Cancer  Maternal Grandfather        LUNG   CAD Maternal Uncle     Social History Social History   Tobacco Use   Smoking status: Never   Smokeless tobacco: Never  Vaping Use   Vaping Use: Never used  Substance Use Topics   Alcohol use: No    Alcohol/week: 4.0 standard drinks    Types: 4 Glasses of wine per week   Drug use: No     Allergies   Latex, Trimethoprim, Almond (diagnostic), and Other   Review of Systems Review of Systems  Gastrointestinal:  Positive for abdominal pain and  blood in stool.  All other systems reviewed and are negative.   Physical Exam Triage Vital Signs ED Triage Vitals  Enc Vitals Group     BP 10/31/21 0848 124/82     Pulse Rate 10/31/21 0848 82     Resp 10/31/21 0848 20     Temp 10/31/21 0848 99.1 F (37.3 C)     Temp src --      SpO2 10/31/21 0848 98 %     Weight --      Height --      Head Circumference --      Peak Flow --      Pain Score 10/31/21 0846 6     Pain Loc --      Pain Edu? --      Excl. in GC? --    No data found.  Updated Vital Signs BP 124/82   Pulse 82   Temp 99.1 F (37.3 C)   Resp 20   LMP 10/24/2021 (Exact Date)   SpO2 98%   Breastfeeding No   Visual Acuity Right Eye Distance:   Left Eye Distance:   Bilateral Distance:    Right Eye Near:   Left Eye Near:    Bilateral Near:     Physical Exam Vitals reviewed.  Constitutional:      General: She is not in acute distress.    Appearance: Normal appearance. She is not ill-appearing.  HENT:     Head: Normocephalic and atraumatic.     Mouth/Throat:     Mouth: Mucous membranes are moist.     Comments: Moist mucous membranes Eyes:     Extraocular Movements: Extraocular movements intact.     Pupils: Pupils are equal, round, and reactive to light.  Cardiovascular:     Rate and Rhythm: Normal rate and regular rhythm.     Heart sounds: Normal heart sounds.  Pulmonary:     Effort: Pulmonary effort is normal.     Breath sounds: Normal breath sounds. No  wheezing, rhonchi or rales.  Abdominal:     General: Bowel sounds are increased. There is no distension.     Palpations: Abdomen is soft. There is no mass.     Tenderness: There is abdominal tenderness in the left lower quadrant. There is no right CVA tenderness, left CVA tenderness, guarding or rebound. Negative signs include Murphy's sign, Rovsing's sign and McBurney's sign.     Comments: BS increased throughout. Mildly TTP lower quadrants L>R. No guarding or rebound. Comfortable throughout exam.   Skin:    General: Skin is warm.     Capillary Refill: Capillary refill takes less than 2 seconds.     Comments: Good skin turgor  Neurological:     General: No focal deficit present.     Mental Status: She is alert and oriented to person, place, and time.  Psychiatric:        Mood and Affect: Mood normal.        Behavior: Behavior normal.     UC Treatments / Results  Labs (all labs ordered are listed, but only abnormal results are displayed) Labs Reviewed  GASTROINTESTINAL PANEL BY PCR, STOOL (REPLACES STOOL CULTURE)    EKG   Radiology No results found.  Procedures Procedures (including critical care time)  Medications Ordered in UC Medications - No data to display  Initial Impression / Assessment and Plan / UC Course  I have reviewed the triage vital signs and the nursing notes.  Pertinent labs &  imaging results that were available during my care of the patient were reviewed by me and considered in my medical decision making (see chart for details).     This patient is a very pleasant 36 y.o. year old female presenting with acute colitis related to suspected food poisoning. Afebrile, nontachy, appears well hydrated. Reassuring exam.   As there is minimal pain on exam, I am comfortable initiating abx and collecting a stool sample. Ciprofloxacin and flagyl sent. She has taken one dose of imodium; stop this.  Strict ED return precautions discussed. Patient verbalizes  understanding and agreement. She is a Engineer, civil (consulting).    Final Clinical Impressions(s) / UC Diagnoses   Final diagnoses:  Colitis     Discharge Instructions      -For colitis, start the antibiotic-Flagyl (metronidazole), 2 pills daily for 7 days.  You can take this with food if you have a sensitive stomach.  Avoid alcohol while taking this medication and for 2 days after as this will cause severe nausea and vomiting. -Also start Ciprofloxacin twice daily x7 days. Avoid strenuous activity like running and jumping while taking this medication.  -Drink plenty of fluids -Avoid imodium -Symptoms worsen - head to the ED    ED Prescriptions     Medication Sig Dispense Auth. Provider   metroNIDAZOLE (FLAGYL) 500 MG tablet Take 1 tablet (500 mg total) by mouth 2 (two) times daily. 14 tablet Rhys Martini, PA-C   ciprofloxacin (CIPRO) 500 MG tablet Take 1 tablet (500 mg total) by mouth 2 (two) times daily for 7 days. 14 tablet Rhys Martini, PA-C      PDMP not reviewed this encounter.   Rhys Martini, PA-C 10/31/21 0945    Rhys Martini, PA-C 10/31/21 1044

## 2021-10-31 NOTE — ED Triage Notes (Signed)
Pt presents with c/o abdominal pain and diarrhea that began last night, pt has h/o colitis and was told on e visit to be evaluated in person  ?

## 2021-10-31 NOTE — Discharge Instructions (Addendum)
-  For colitis, start the antibiotic-Flagyl (metronidazole), 2 pills daily for 7 days.  You can take this with food if you have a sensitive stomach.  Avoid alcohol while taking this medication and for 2 days after as this will cause severe nausea and vomiting. ?-Also start Ciprofloxacin twice daily x7 days. Avoid strenuous activity like running and jumping while taking this medication.  ?-Drink plenty of fluids ?-Avoid imodium ?-Symptoms worsen - head to the ED ? ?

## 2021-11-03 ENCOUNTER — Telehealth: Payer: No Typology Code available for payment source | Admitting: Emergency Medicine

## 2021-11-03 ENCOUNTER — Other Ambulatory Visit (HOSPITAL_COMMUNITY): Payer: Self-pay

## 2021-11-03 DIAGNOSIS — B370 Candidal stomatitis: Secondary | ICD-10-CM | POA: Diagnosis not present

## 2021-11-03 MED ORDER — NYSTATIN 100000 UNIT/ML MT SUSP
5.0000 mL | Freq: Four times a day (QID) | OROMUCOSAL | 0 refills | Status: DC
  Filled 2021-11-03: qty 60, 3d supply, fill #0

## 2021-11-03 NOTE — Progress Notes (Signed)
E-Visit for Mouth Ulcers ? ?We are sorry that you are not feeling well.  Here is how we plan to help! ? ?Based on what you have shared with me, it appears that you do have thrush. ?    ?The following medications should decrease the discomfort and help with healing: Nystatin ? ?Mouth ulcers are painful areas in the mouth and gums. These are also known as ?canker sores?.  They can occur anywhere inside the mouth. While mostly harmless, mouth ulcers can be extremely uncomfortable and may make it difficult to eat, drink, and brush your teeth.  You may have more than 1 ulcer and they can vary and change in size. ?Mouth ulcers are not contagious and should not be confused with cold sores.  Cold sores appear on the lip or around the outside of the mouth and often begin with a tingling, burning or itching sensation.  ? ?While the exact causes are unknown, some common causes and factors that may aggravate mouth ulcers include: ?Genetics - Sometimes mouth ulcers run in families ?High alcohol intake ?Acidic foods such as citrus fruits like pineapple, grapefruit, orange fruits/juices, may aggravate mouth ulcers ?Other foods high in acidity or spice such as coffee, chocolate, chips, pretzels, eggs, nuts, cheese ?Quitting smoking ?Injury caused by biting the tongue or inside of the cheek ?Diet lacking in 0000000, zinc, folic acid or iron ?Female hormone shifts with menstruation ?Excessive fatigue, emotional stress or anxiety ?Prevention: ?Talk to your doctor if you are taking meds that are known to cause mouth ulcers such as:   Anti-inflammatory drugs (for example Ibuprofen, Naproxen sodium), pain killers, Beta blockers, Oral nicotine replacement drugs, Some street drugs (heroin).   ?Avoid allowing any tablets to dissolve in your mouth that are meant to swallowed whole ?Avoid foods/drinks that trigger or worsen symptoms ?Keep your mouth clean with daily brushing and flossing ? ?Home Care: ?The goal with treatment is to ease the pain  where ulcers occur and help them heal as quickly as possible.  There is no medical treatment to prevent mouth ulcers from coming back or recurring.  ?Avoid spicy and acidic foods ?Eat soft foods and avoid rough, crunchy foods ?Avoid chewing gum ?Do not use toothpaste that contains sodium lauryl sulphite ?Use a straw to drink which helps avoid liquids toughing the ulcers near the front of your mouth ?Use a very soft toothbrush ?If you have dentures or dental hardware that you feel is not fitting well or contributing to his, please see your dentist. ?Use saltwater mouthwash which helps healing. Dissolve a ? teaspoon of salt in a glass of warm water. Swish around your mouth and spit it out. This can be used as needed if it is soothing.  ? ?GET HELP RIGHT AWAY IF: ?Persistent ulcers require checking IN PERSON (face to face). Any mouth lesion lasting longer than a month should be seen by your DENTIST as soon as possible for evaluation for possible oral cancer. ?If you have a non-painful ulcer in 1 or more areas of your mouth ?Ulcers that are spreading, are very large or particularly painful ?Ulcers last longer than one week without improving on treatment ?If you develop a fever, swollen glands and begin to feel unwell ?Ulcers that developed after starting a new medication ?MAKE SURE YOU: ?Understand these instructions. ?Will watch your condition. ?Will get help right away if you are not doing well or get worse. ? ?Thank you for choosing an e-visit. ? ?Your e-visit answers were reviewed by a  board certified advanced clinical practitioner to complete your personal care plan. Depending upon the condition, your plan could have included both over the counter or prescription medications. ? ?Please review your pharmacy choice. Make sure the pharmacy is open so you can pick up prescription now. If there is a problem, you may contact your provider through [employer] and have the prescription routed to another pharmacy.   Your safety is important to [place]. If you have drug allergies check your prescription carefully.  ? ?For the next 24 hours you can use MyChart to ask questions about today's visit, request a non-urgent call back, or ask for a work or school excuse. ?You will get an email in the next two days asking about your experience. I hope that your e-visit has been valuable and will speed your recovery. ? ?I have spent 5 minutes in review of e-visit questionnaire, review and updating patient chart, medical decision making and response to patient.  ? ?Willeen Cass, PhD, FNP-BC ?  ?

## 2021-11-10 ENCOUNTER — Ambulatory Visit (INDEPENDENT_AMBULATORY_CARE_PROVIDER_SITE_OTHER): Payer: No Typology Code available for payment source

## 2021-11-10 ENCOUNTER — Telehealth: Payer: No Typology Code available for payment source | Admitting: Physician Assistant

## 2021-11-10 DIAGNOSIS — J309 Allergic rhinitis, unspecified: Secondary | ICD-10-CM | POA: Diagnosis not present

## 2021-11-10 DIAGNOSIS — B370 Candidal stomatitis: Secondary | ICD-10-CM

## 2021-11-11 ENCOUNTER — Other Ambulatory Visit (HOSPITAL_COMMUNITY): Payer: Self-pay

## 2021-11-11 MED ORDER — NYSTATIN 100000 UNIT/ML MT SUSP
5.0000 mL | Freq: Four times a day (QID) | OROMUCOSAL | 0 refills | Status: DC
  Filled 2021-11-11: qty 100, 5d supply, fill #0

## 2021-11-11 NOTE — Progress Notes (Signed)
I have spent 5 minutes in review of e-visit questionnaire, review and updating patient chart, medical decision making and response to patient.   Ziyanna Tolin [place], PA-C    

## 2021-11-11 NOTE — Progress Notes (Signed)
E-Visit for Mouth Ulcers ? ?We are sorry that you are not feeling well.  Here is how we plan to help! ? ?Based on what you have shared with me, it appears that you do have some residual thrush. I agree that a repeat course of nystatin should take care of the issue now that antibiotics are finished. I have sent a refill of the nystatin mouthwash to the pharmacy for you to use as directed.  ?    ? ? ?Prevention: ?Avoid allowing any tablets to dissolve in your mouth that are meant to swallowed whole ?Avoid foods/drinks that trigger or worsen symptoms ?Keep your mouth clean with daily brushing and flossing ? ? ? ?Understand these instructions. ?Will watch your condition. ?Will get help right away if you are not doing well or get worse. ? ?Thank you for choosing an e-visit. ? ?Your e-visit answers were reviewed by a board certified advanced clinical practitioner to complete your personal care plan. Depending upon the condition, your plan could have included both over the counter or prescription medications. ? ?Please review your pharmacy choice. Make sure the pharmacy is open so you can pick up prescription now. If there is a problem, you may contact your provider through [employer] and have the prescription routed to another pharmacy.  Your safety is important to [place]. If you have drug allergies check your prescription carefully.  ? ?For the next 24 hours you can use MyChart to ask questions about today's visit, request a non-urgent call back, or ask for a work or school excuse. ?You will get an email in the next two days asking about your experience. I hope that your e-visit has been valuable and will speed your recovery. ? ?

## 2021-11-14 ENCOUNTER — Telehealth: Payer: No Typology Code available for payment source | Admitting: Emergency Medicine

## 2021-11-14 ENCOUNTER — Other Ambulatory Visit (HOSPITAL_COMMUNITY): Payer: Self-pay

## 2021-11-14 DIAGNOSIS — B370 Candidal stomatitis: Secondary | ICD-10-CM

## 2021-11-14 MED ORDER — FLUCONAZOLE 150 MG PO TABS
150.0000 mg | ORAL_TABLET | ORAL | 0 refills | Status: DC
  Filled 2021-11-14 (×2): qty 5, 5d supply, fill #0

## 2021-11-14 NOTE — Progress Notes (Signed)
Based on what you shared with me, I feel your condition warrants further evaluation and I recommend that you be seen in a face to face visit. This is your 3rd evisit for thrush and the nystatin should have helped by now. And now you have vaginal yeast symptoms, too. I think there is too much going on for [place] to fix it with an evisit. I think you should be seen in person again.    NOTE: There will be NO CHARGE for this eVisit   If you are having a true medical emergency please call 911.      For an urgent face to face visit, [facility] has six urgent care centers for your convenience:     [facility] at [facility] Directions [phone] [address] [place], [place] [address]    [name]) Get Driving Directions [phone] [address] [place], [place] [address]  [facility]) Get Driving Directions [phone] [address] [place],  [place]  [address]  [facility] at [facility] Get Driving Directions [phone] [address] [place], [place] [address]   [facility] at [facility] Get Driving Directions  [phone] [address] [place], [place] [address]   [facility] at [facility] Directions [phone] [address] [place], [place] [address]  Your MyChart E-visit questionnaire answers were reviewed by a board certified advanced clinical practitioner to complete your personal care plan based on your specific symptoms.  Thank you for using e-Visits.

## 2021-11-18 ENCOUNTER — Ambulatory Visit (INDEPENDENT_AMBULATORY_CARE_PROVIDER_SITE_OTHER): Payer: No Typology Code available for payment source

## 2021-11-18 DIAGNOSIS — J309 Allergic rhinitis, unspecified: Secondary | ICD-10-CM

## 2021-11-28 ENCOUNTER — Ambulatory Visit (INDEPENDENT_AMBULATORY_CARE_PROVIDER_SITE_OTHER): Payer: No Typology Code available for payment source | Admitting: *Deleted

## 2021-11-28 DIAGNOSIS — J309 Allergic rhinitis, unspecified: Secondary | ICD-10-CM

## 2021-12-04 ENCOUNTER — Other Ambulatory Visit (HOSPITAL_COMMUNITY): Payer: Self-pay

## 2021-12-10 ENCOUNTER — Encounter: Payer: Self-pay | Admitting: Allergy

## 2021-12-10 ENCOUNTER — Other Ambulatory Visit (HOSPITAL_COMMUNITY): Payer: Self-pay

## 2021-12-10 MED ORDER — ALBUTEROL SULFATE HFA 108 (90 BASE) MCG/ACT IN AERS
2.0000 | INHALATION_SPRAY | Freq: Four times a day (QID) | RESPIRATORY_TRACT | 0 refills | Status: DC | PRN
  Filled 2021-12-10: qty 6.7, 25d supply, fill #0

## 2021-12-13 ENCOUNTER — Telehealth: Payer: No Typology Code available for payment source | Admitting: Nurse Practitioner

## 2021-12-13 DIAGNOSIS — R197 Diarrhea, unspecified: Secondary | ICD-10-CM | POA: Diagnosis not present

## 2021-12-13 MED ORDER — LOPERAMIDE HCL 2 MG PO CAPS: 2.0000 mg | ORAL_CAPSULE | ORAL | 0 refills | Status: DC | PRN

## 2021-12-13 NOTE — Progress Notes (Signed)
I have spent 5 minutes in review of e-visit questionnaire, review and updating patient chart, medical decision making and response to patient.  ° °Keiri Solano W Damauri Minion, NP ° °  °

## 2021-12-13 NOTE — Progress Notes (Signed)
We are sorry that you are not feeling well.  Here is how we plan to help!  Based on what you have shared with me it looks like you have Acute Infectious Diarrhea.  Most cases of acute diarrhea are due to infections with virus and bacteria and are self-limited conditions lasting less than 14 days.  For your symptoms you may take Imodium 2 mg tablets. Take two tablet now and then one after each loose stool up to 6 a day.  Antibiotics are not needed for most people with diarrhea.    HOME CARE We recommend changing your diet to help with your symptoms for the next few days. Drink plenty of fluids that contain water salt and sugar. Sports drinks such as Gatorade may help.  You may try broths, soups, bananas, applesauce, soft breads, mashed potatoes or crackers.  You are considered infectious for as long as the diarrhea continues. Hand washing or use of alcohol based hand sanitizers is recommend. It is best to stay out of work or school until your symptoms stop.   GET HELP RIGHT AWAY If you have dark yellow colored urine or do not pass urine frequently you should drink more fluids.   If your symptoms worsen  If you feel like you are going to pass out (faint) You have a new problem  MAKE SURE YOU  Understand these instructions. Will watch your condition. Will get help right away if you are not doing well or get worse.  Thank you for choosing an e-visit.  Your e-visit answers were reviewed by a board certified advanced clinical practitioner to complete your personal care plan. Depending upon the condition, your plan could have included both over the counter or prescription medications.  Please review your pharmacy choice. Make sure the pharmacy is open so you can pick up prescription now. If there is a problem, you may contact your provider through [employer] and have the prescription routed to another pharmacy.  Your safety is important to [place]. If you have drug allergies check your  prescription carefully.   For the next 24 hours you can use MyChart to ask questions about today's visit, request a non-urgent call back, or ask for a work or school excuse. You will get an email in the next two days asking about your experience. I hope that your e-visit has been valuable and will speed your recovery.

## 2021-12-15 ENCOUNTER — Other Ambulatory Visit: Payer: Self-pay | Admitting: Nurse Practitioner

## 2021-12-15 DIAGNOSIS — R197 Diarrhea, unspecified: Secondary | ICD-10-CM

## 2021-12-15 MED ORDER — LOPERAMIDE HCL 2 MG PO CAPS: 2.0000 mg | ORAL_CAPSULE | Freq: Four times a day (QID) | ORAL | 0 refills | Status: AC

## 2021-12-16 ENCOUNTER — Other Ambulatory Visit (HOSPITAL_COMMUNITY): Payer: Self-pay

## 2021-12-18 ENCOUNTER — Ambulatory Visit (INDEPENDENT_AMBULATORY_CARE_PROVIDER_SITE_OTHER): Payer: No Typology Code available for payment source | Admitting: Family Medicine

## 2021-12-18 ENCOUNTER — Encounter: Payer: Self-pay | Admitting: Family Medicine

## 2021-12-18 ENCOUNTER — Ambulatory Visit (INDEPENDENT_AMBULATORY_CARE_PROVIDER_SITE_OTHER): Payer: No Typology Code available for payment source

## 2021-12-18 ENCOUNTER — Other Ambulatory Visit (HOSPITAL_COMMUNITY): Payer: Self-pay

## 2021-12-18 VITALS — BP 126/74 | HR 114 | Temp 97.8°F | Ht 65.0 in | Wt 235.0 lb

## 2021-12-18 DIAGNOSIS — R3915 Urgency of urination: Secondary | ICD-10-CM | POA: Diagnosis not present

## 2021-12-18 DIAGNOSIS — R051 Acute cough: Secondary | ICD-10-CM

## 2021-12-18 DIAGNOSIS — R5383 Other fatigue: Secondary | ICD-10-CM

## 2021-12-18 DIAGNOSIS — J4541 Moderate persistent asthma with (acute) exacerbation: Secondary | ICD-10-CM | POA: Diagnosis not present

## 2021-12-18 DIAGNOSIS — Q992 Fragile X chromosome: Secondary | ICD-10-CM | POA: Insufficient documentation

## 2021-12-18 DIAGNOSIS — R0602 Shortness of breath: Secondary | ICD-10-CM | POA: Insufficient documentation

## 2021-12-18 DIAGNOSIS — R300 Dysuria: Secondary | ICD-10-CM | POA: Diagnosis not present

## 2021-12-18 DIAGNOSIS — N3001 Acute cystitis with hematuria: Secondary | ICD-10-CM

## 2021-12-18 DIAGNOSIS — N926 Irregular menstruation, unspecified: Secondary | ICD-10-CM | POA: Insufficient documentation

## 2021-12-18 LAB — POCT URINALYSIS DIPSTICK
Glucose, UA: NEGATIVE
Ketones, UA: NEGATIVE
Nitrite, UA: NEGATIVE
Protein, UA: POSITIVE — AB
Spec Grav, UA: 1.025 (ref 1.010–1.025)
Urobilinogen, UA: NEGATIVE E.U./dL — AB
pH, UA: 6 (ref 5.0–8.0)

## 2021-12-18 LAB — COMPREHENSIVE METABOLIC PANEL
ALT: 34 U/L (ref 0–35)
AST: 40 U/L — ABNORMAL HIGH (ref 0–37)
Albumin: 3.3 g/dL — ABNORMAL LOW (ref 3.5–5.2)
Alkaline Phosphatase: 63 U/L (ref 39–117)
BUN: 7 mg/dL (ref 6–23)
CO2: 27 mEq/L (ref 19–32)
Calcium: 8.9 mg/dL (ref 8.4–10.5)
Chloride: 103 mEq/L (ref 96–112)
Creatinine, Ser: 0.75 mg/dL (ref 0.40–1.20)
GFR: 103.95 mL/min (ref >60.00)
Glucose, Bld: 90 mg/dL (ref 70–99)
Potassium: 3.7 mEq/L (ref 3.5–5.1)
Sodium: 136 mEq/L (ref 135–145)
Total Bilirubin: 0.5 mg/dL (ref 0.2–1.2)
Total Protein: 6.8 g/dL (ref 6.0–8.3)

## 2021-12-18 LAB — CBC WITH DIFFERENTIAL/PLATELET
Basophils Absolute: 0.1 10*3/uL (ref 0.0–0.1)
Basophils Relative: 0.9 % (ref 0.0–3.0)
Eosinophils Absolute: 0.1 10*3/uL (ref 0.0–0.7)
Eosinophils Relative: 1.4 % (ref 0.0–5.0)
HCT: 38.7 % (ref 36.0–46.0)
Hemoglobin: 12.8 g/dL (ref 12.0–15.0)
Lymphocytes Relative: 53.4 % — ABNORMAL HIGH (ref 12.0–46.0)
Lymphs Abs: 4.7 10*3/uL — ABNORMAL HIGH (ref 0.7–4.0)
MCHC: 33.1 g/dL (ref 30.0–36.0)
MCV: 83.5 fl (ref 78.0–100.0)
Monocytes Absolute: 0.6 10*3/uL (ref 0.1–1.0)
Monocytes Relative: 7 % (ref 3.0–12.0)
Neutro Abs: 3.3 10*3/uL (ref 1.4–7.7)
Neutrophils Relative %: 37.3 % — ABNORMAL LOW (ref 43.0–77.0)
Platelets: 194 10*3/uL (ref 150.0–400.0)
RBC: 4.64 Mil/uL (ref 3.87–5.11)
RDW: 15.7 % — ABNORMAL HIGH (ref 11.5–15.5)
WBC: 8.9 10*3/uL (ref 4.0–10.5)

## 2021-12-18 LAB — T4, FREE: Free T4: 0.99 ng/dL (ref 0.60–1.60)

## 2021-12-18 LAB — TSH: TSH: 1.01 u[IU]/mL (ref 0.35–5.50)

## 2021-12-18 MED ORDER — CEPHALEXIN 500 MG PO CAPS
500.0000 mg | ORAL_CAPSULE | Freq: Two times a day (BID) | ORAL | 0 refills | Status: DC
  Filled 2021-12-18: qty 14, 7d supply, fill #0

## 2021-12-18 MED ORDER — PREDNISONE 10 MG (21) PO TBPK
ORAL_TABLET | Freq: Every day | ORAL | 0 refills | Status: DC
  Filled 2021-12-18: qty 21, 6d supply, fill #0

## 2021-12-18 NOTE — Assessment & Plan Note (Signed)
Keflex prescribed.  Urine culture sent.  Push fluids.  Follow-up if worsening or not back to baseline when she completes the antibiotic.

## 2021-12-18 NOTE — Assessment & Plan Note (Signed)
Chest x-ray and labs ordered.  We discussed DDx which include asthma flare, bacterial infection and less likely PE.  We discussed D-dimer but did not order today.  If she is worsening over the weekend then she will go to the ED for further evaluation and possible CTA.  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

## 2021-12-18 NOTE — Telephone Encounter (Signed)
Kristina Stewart called and discussed with pt results, which medication she was sending in and where

## 2021-12-18 NOTE — Assessment & Plan Note (Signed)
Most likely related to current asthma flare but also recent diarrhea which has resolved.  Check labs and follow-up.

## 2021-12-18 NOTE — Progress Notes (Signed)
Subjective:     Patient ID: Kristina Stewart, female    DOB: [birth year], 36 y.o.   MRN: [id number]  Chief Complaint  Patient presents with   Asthma    Has been having asthma flare up for about 9 days now. SOB and coughing    HPI Patient is in today for asthma flare. 9 day hx of feeling tired, having intermittent shortness of breath and chest pressure. Cough is mostly dry. Does not notice much wheezing.   States she aspirated while drinking water last week. She has been having her usual allergy symptoms and taking all of her medications for allergies and asthma. Using inhaler more often which is keeping her heart rate around 100 for the past 2 days.  Taking Mucinex DM.   Her usual routine includes Dymista, Singulair, Xyzal at night.  Trelegy, Dymista, Allegra in mornings.   She is under the care of allergy and asthma specialist.   Urinary urgency and dysuria for the past day. Recent diarrhea related to IBS, since resolved.   Denies fever, chills, dizziness, palpitations, abdominal pain, N/V, LE edema.   No leg or calf pain or swelling.  No recent surgery or immobilization. Denies personal or family hx of DVT or PE.      Health Maintenance Due  Topic Date Due   Hepatitis C Screening  Never done    Past Medical History:  Diagnosis Date   Allergy    Asthma    Family history of adverse reaction to anesthesia    sister problems with propofol   IBS (irritable bowel syndrome)    Obesity    Seasonal allergic rhinitis    Strep pharyngitis    TMJ (temporomandibular joint syndrome)    Vertigo     Past Surgical History:  Procedure Laterality Date   BRAIN SURGERY     craniotomy/arnold chiari malformation; craniotomy & laminectomy   BRAIN SURGERY     craniotomy/arnold chiari malformation    Family History  Problem Relation Age of Onset   Hypertension Mother    Thyroid disease Mother    Hyperlipidemia Mother    Neuropathy Mother    Hypertension Father     Irritable bowel syndrome Father    Hyperlipidemia Father    GER disease Sister    Fragile X syndrome Sister    Alzheimer's disease Paternal Grandfather    Diabetes Paternal Grandmother    Fibromyalgia Paternal Grandmother    Thyroid disease Maternal Grandmother        HYPOTHYROID   Hypertension Maternal Grandmother    Cancer Maternal Grandmother        LUNG   Cancer Maternal Grandfather        LUNG   CAD Maternal Uncle     Social History   Socioeconomic History   Marital status: Married    Spouse name: Not on file   Number of children: 1   Years of education: Not on file   Highest education level: Master's degree (e.g., MA, MS, MEng, MEd, MSW, MBA)  Occupational History   Not on file  Tobacco Use   Smoking status: Never   Smokeless tobacco: Never  Vaping Use   Vaping Use: Never used  Substance and Sexual Activity   Alcohol use: No    Alcohol/week: 4.0 standard drinks of alcohol    Types: 4 Glasses of wine per week   Drug use: No   Sexual activity: Yes  Other Topics Concern   Not on file  Social History  Narrative   Lives at home with husband & son   Right handed   Caffeine: none      ** Merged History Encounter **       Social Determinants of Health   Financial Resource Strain: Not on file  Food Insecurity: Not on file  Transportation Needs: Not on file  Physical Activity: Not on file  Stress: Not on file  Social Connections: Not on file  Intimate Partner Violence: Not on file    Outpatient Medications Prior to Visit  Medication Sig Dispense Refill   albuterol (PROVENTIL) (2.5 MG/3ML) 0.083% nebulizer solution Take 3 mLs (2.5 mg total) by nebulization every 6 (six) hours as needed for wheezing or shortness of breath (coughing fits). 75 mL 2   albuterol (VENTOLIN HFA) 108 (90 Base) MCG/ACT inhaler Inhale 2 puffs into the lungs every 6 (six) hours as needed for wheezing or shortness of breath. 6.7 g 0   Azelastine-Fluticasone 137-50 MCG/ACT SUSP Place 1  spray into the nose in the morning and at bedtime. 23 g 5   COVID-19 mRNA bivalent vaccine, Pfizer, (PFIZER COVID-19 VAC BIVALENT) injection Inject into the muscle. 0.3 mL 0   Crisaborole (EUCRISA) 2 % OINT Apply 1 application topically 2 (two) times daily as needed. 100 g 5   etonogestrel-ethinyl estradiol (NUVARING) 0.12-0.015 MG/24HR vaginal ring Insert 1 ring vaginally every month 3 each 3   fexofenadine (ALLEGRA) 180 MG tablet Take 180 mg by mouth daily.     Fluticasone-Umeclidin-Vilant (TRELEGY ELLIPTA) 200-62.5-25 MCG/ACT AEPB Inhale 1 puff into the lungs daily. Rinse mouth after each use 60 each 5   ipratropium (ATROVENT) 0.03 % nasal spray Place 1-2 sprays into both nostrils 2 (two) times daily as needed (nasal drainage). 30 mL 5   ipratropium-albuterol (DUONEB) 0.5-2.5 (3) MG/3ML SOLN Take 3 mLs by nebulization every 4 (four) hours as needed (coughing fits, shortness of breath, wheezing). 180 mL 2   levocetirizine (XYZAL) 5 MG tablet Take 5 mg by mouth every evening.     loperamide (IMODIUM) 2 MG capsule Take 1 capsule (2 mg total) by mouth every 6 (six) hours. May start with 2 capsules initially 30 capsule 0   montelukast (SINGULAIR) 10 MG tablet Take 1 tablet (10 mg total) by mouth at bedtime. 30 tablet 5   Multiple Vitamin (MULTIVITAMIN PO) Take by mouth.     olopatadine (PATADAY) 0.1 % ophthalmic solution 1 drop 2 (two) times daily.     sertraline (ZOLOFT) 25 MG tablet Take 1 tablet (25 mg total) by mouth daily. 90 tablet 3   triamcinolone ointment (KENALOG) 0.1 % Apply 1 application topically 2 (two) times daily. 30 g 2   etonogestrel-ethinyl estradiol (NUVARING) 0.12-0.015 MG/24HR vaginal ring INSERT 1 RING VAGINALLY EVERY MONTH (Patient taking differently: INSERT 1 RING VAGINALLY EVERY MONTH) 3 each 3   fluconazole (DIFLUCAN) 150 MG tablet Take 1 tablet (150 mg total) by mouth as directed. 5 tablet 0   metroNIDAZOLE (FLAGYL) 500 MG tablet Take 1 tablet (500 mg total) by mouth 2  (two) times daily. 14 tablet 0   nystatin (MYCOSTATIN) 100000 UNIT/ML suspension Take 5 mLs (500,000 Units total) by mouth 4 (four) times daily. Swish and hold in mouth for as long as possible before swallowing. 100 mL 0   Olopatadine-Mometasone (RYALTRIS) 665-25 MCG/ACT SUSP Use 1 to 2 sprays in each nostril 2 times daily 29 g 5   RYALTRIS 665-25 MCG/ACT SUSP Place 1-2 sprays into the nose in the morning and at  bedtime. 29 g 5   No facility-administered medications prior to visit.    Allergies  Allergen Reactions   Latex Rash   Trimethoprim    Almond (Diagnostic) Itching   Other Itching    PT IS ALLERGIC PITTED FRUITS    ROS Pertinent positives and negatives in the history of present illness.     Objective:    Physical Exam Constitutional:      General: She is not in acute distress.    Appearance: She is not ill-appearing or diaphoretic.  Cardiovascular:     Rate and Rhythm: Regular rhythm. Tachycardia present.     Pulses: Normal pulses.  Pulmonary:     Effort: Pulmonary effort is normal.     Breath sounds: Examination of the right-lower field reveals wheezing. Examination of the left-lower field reveals decreased breath sounds. Decreased breath sounds and wheezing present.  Musculoskeletal:        General: Normal range of motion.     Cervical back: Normal range of motion and neck supple.  Lymphadenopathy:     Cervical: No cervical adenopathy.  Skin:    General: Skin is warm and dry.  Neurological:     General: No focal deficit present.     Mental Status: She is alert and oriented to person, place, and time.  Psychiatric:        Mood and Affect: Mood normal.        Behavior: Behavior normal.        Thought Content: Thought content normal.     BP 126/74 (BP Location: Left Arm, Patient Position: Sitting, Cuff Size: Large)   Pulse (!) 114   Temp 97.8 F (36.6 C) (Temporal)   Ht 5\' 5"  (1.651 m)   Wt 235 lb (106.6 kg)   LMP 12/17/2021   SpO2 98%   BMI 39.11 kg/m   Wt Readings from Last 3 Encounters:  12/18/21 235 lb (106.6 kg)  07/14/21 242 lb 6.4 oz (110 kg)  03/24/21 240 lb (108.9 kg)       Assessment & Plan:   Problem List Items Addressed This Visit       Respiratory   Moderate persistent asthma with acute exacerbation - Primary    Prednisone Dosepak prescribed.  This has worked well for her in the past.  She will continue with her usual allergy and asthma treatment including albuterol inhaler and nebulizer as needed.  Chest x-ray ordered to rule out aspiration pneumonia.      Relevant Medications   predniSONE (STERAPRED UNI-PAK 21 TAB) 10 MG (21) TBPK tablet   Other Relevant Orders   DG Chest 2 View (Completed)     Genitourinary   Acute cystitis with hematuria    Keflex prescribed.  Urine culture sent.  Push fluids.  Follow-up if worsening or not back to baseline when she completes the antibiotic.      Relevant Medications   cephALEXin (KEFLEX) 500 MG capsule     Other   Fatigue    Most likely related to current asthma flare but also recent diarrhea which has resolved.  Check labs and follow-up.      Relevant Orders   CBC with Differential/Platelet   Comprehensive metabolic panel   TSH   T4, free   Shortness of breath    Chest x-ray and labs ordered.  We discussed DDx which include asthma flare, bacterial infection and less likely PE.  We discussed D-dimer but did not order today.  If she is worsening over  the weekend then she will go to the ED for further evaluation and possible CTA.  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 I am also having her start on predniSONE and cephALEXin. Additionally, I am having her maintain her levocetirizine, Multiple Vitamin (MULTIVITAMIN PO), Eucrisa, triamcinolone ointment, fexofenadine, ipratropium, albuterol, ipratropium-albuterol, Pfizer COVID-19 Vac Bivalent, olopatadine, Trelegy Ellipta, montelukast, Azelastine-Fluticasone, sertraline, etonogestrel-ethinyl estradiol, albuterol, and loperamide.  Meds ordered this encounter  Medications   predniSONE (STERAPRED UNI-PAK 21 TAB) 10 MG (21) TBPK tablet    Sig: Take by mouth daily as directed    Dispense:  21 tablet    Refill:  0    Order Specific Question:   Supervising Provider    Answer:   Hillard Danker A [[id number]]   cephALEXin (KEFLEX) 500 MG capsule    Sig: Take 1 capsule (500 mg total) by mouth 2 (two) times daily.    Dispense:  14 capsule    Refill:  0    Order Specific Question:   Supervising Provider    Answer:   Hillard Danker A [[id number]]

## 2021-12-18 NOTE — Assessment & Plan Note (Signed)
Prednisone Dosepak prescribed.  This has worked well for her in the past.  She will continue with her usual allergy and asthma treatment including albuterol inhaler and nebulizer as needed.  Chest x-ray ordered to rule out aspiration pneumonia.

## 2021-12-21 LAB — URINE CULTURE

## 2021-12-22 ENCOUNTER — Encounter: Payer: Self-pay | Admitting: Family Medicine

## 2022-01-05 ENCOUNTER — Other Ambulatory Visit (HOSPITAL_COMMUNITY): Payer: Self-pay

## 2022-01-06 ENCOUNTER — Other Ambulatory Visit (HOSPITAL_COMMUNITY): Payer: Self-pay

## 2022-01-07 ENCOUNTER — Ambulatory Visit (INDEPENDENT_AMBULATORY_CARE_PROVIDER_SITE_OTHER): Payer: No Typology Code available for payment source | Admitting: Internal Medicine

## 2022-01-07 ENCOUNTER — Encounter: Payer: Self-pay | Admitting: Internal Medicine

## 2022-01-07 ENCOUNTER — Ambulatory Visit (INDEPENDENT_AMBULATORY_CARE_PROVIDER_SITE_OTHER): Payer: No Typology Code available for payment source

## 2022-01-07 ENCOUNTER — Ambulatory Visit: Payer: No Typology Code available for payment source | Admitting: Internal Medicine

## 2022-01-07 VITALS — BP 118/72 | HR 92 | Temp 98.3°F | Ht 65.0 in | Wt 231.0 lb

## 2022-01-07 DIAGNOSIS — Z23 Encounter for immunization: Secondary | ICD-10-CM

## 2022-01-07 DIAGNOSIS — R10816 Epigastric abdominal tenderness: Secondary | ICD-10-CM | POA: Diagnosis not present

## 2022-01-07 DIAGNOSIS — J4540 Moderate persistent asthma, uncomplicated: Secondary | ICD-10-CM | POA: Diagnosis not present

## 2022-01-07 DIAGNOSIS — J309 Allergic rhinitis, unspecified: Secondary | ICD-10-CM

## 2022-01-07 LAB — CBC WITH DIFFERENTIAL/PLATELET
Basophils Absolute: 0 10*3/uL (ref 0.0–0.1)
Basophils Relative: 0.5 % (ref 0.0–3.0)
Eosinophils Absolute: 0.2 10*3/uL (ref 0.0–0.7)
Eosinophils Relative: 2 % (ref 0.0–5.0)
HCT: 37.5 % (ref 36.0–46.0)
Hemoglobin: 12.3 g/dL (ref 12.0–15.0)
Lymphocytes Relative: 48.2 % — ABNORMAL HIGH (ref 12.0–46.0)
Lymphs Abs: 4 10*3/uL (ref 0.7–4.0)
MCHC: 32.7 g/dL (ref 30.0–36.0)
MCV: 85.3 fl (ref 78.0–100.0)
Monocytes Absolute: 0.5 10*3/uL (ref 0.1–1.0)
Monocytes Relative: 5.5 % (ref 3.0–12.0)
Neutro Abs: 3.6 10*3/uL (ref 1.4–7.7)
Neutrophils Relative %: 43.8 % (ref 43.0–77.0)
Platelets: 205 10*3/uL (ref 150.0–400.0)
RBC: 4.4 Mil/uL (ref 3.87–5.11)
RDW: 17.5 % — ABNORMAL HIGH (ref 11.5–15.5)
WBC: 8.3 10*3/uL (ref 4.0–10.5)

## 2022-01-07 LAB — URINALYSIS, ROUTINE W REFLEX MICROSCOPIC
Bilirubin Urine: NEGATIVE
Hgb urine dipstick: NEGATIVE
Ketones, ur: NEGATIVE
Nitrite: NEGATIVE
RBC / HPF: NONE SEEN (ref 0–?)
Specific Gravity, Urine: 1.02 (ref 1.000–1.030)
Total Protein, Urine: NEGATIVE
Urine Glucose: NEGATIVE
Urobilinogen, UA: 0.2 (ref 0.0–1.0)
pH: 7 (ref 5.0–8.0)

## 2022-01-07 LAB — HEPATIC FUNCTION PANEL
ALT: 32 U/L (ref 0–35)
AST: 25 U/L (ref 0–37)
Albumin: 3.8 g/dL (ref 3.5–5.2)
Alkaline Phosphatase: 73 U/L (ref 39–117)
Bilirubin, Direct: 0.1 mg/dL (ref 0.0–0.3)
Total Bilirubin: 0.4 mg/dL (ref 0.2–1.2)
Total Protein: 7.1 g/dL (ref 6.0–8.3)

## 2022-01-07 LAB — LIPASE: Lipase: 31 U/L (ref 11.0–59.0)

## 2022-01-07 LAB — AMYLASE: Amylase: 71 U/L (ref 27–131)

## 2022-01-07 LAB — HCG, QUANTITATIVE, PREGNANCY: Quantitative HCG: <0.6 m[IU]/mL

## 2022-01-07 NOTE — Progress Notes (Unsigned)
Subjective:  Patient ID: Kristina Stewart, female    DOB: 1986-06-23  Age: 36 y.o. MRN: [id number]  CC: Abdominal Pain   HPI Karlotta Mccafferty presents for f/up -   She complains of a 1 week history of stabbing pain in the epigastric region.  She has had some nausea and weight loss but denies vomiting, loss of appetite, dysuria, or hematuria.  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  Outpatient Medications Prior to Visit  Medication Sig Dispense Refill   albuterol (PROVENTIL) (2.5 MG/3ML) 0.083% nebulizer solution Take 3 mLs (2.5 mg total) by nebulization every 6 (six) hours as needed for wheezing or shortness of breath (coughing fits). 75 mL 2   albuterol (VENTOLIN HFA) 108 (90 Base) MCG/ACT inhaler Inhale 2 puffs into the lungs every 6 (six) hours as needed for wheezing or shortness of breath. 6.7 g 0   Azelastine-Fluticasone 137-50 MCG/ACT SUSP Place 1 spray into the nose in the morning and at bedtime. 23 g 5   COVID-19 mRNA bivalent vaccine, Pfizer, (PFIZER COVID-19 VAC BIVALENT) injection Inject into the muscle. 0.3 mL 0   Crisaborole (EUCRISA) 2 % OINT Apply 1 application topically 2 (two) times daily as needed. 100 g 5   etonogestrel-ethinyl estradiol (NUVARING) 0.12-0.015 MG/24HR vaginal ring Insert 1 ring vaginally every month 3 each 3   fexofenadine (ALLEGRA) 180 MG tablet Take 180 mg by mouth daily.     Fluticasone-Umeclidin-Vilant (TRELEGY ELLIPTA) 200-62.5-25 MCG/ACT AEPB Inhale 1 puff into the lungs daily. Rinse mouth after each use 60 each 5   ipratropium (ATROVENT) 0.03 % nasal spray Place 1-2 sprays into both nostrils 2 (two) times daily as needed (nasal drainage). 30 mL 5   ipratropium-albuterol (DUONEB) 0.5-2.5 (3) MG/3ML SOLN Take 3 mLs by nebulization every 4 (four) hours as needed (coughing fits, shortness of breath, wheezing). 180 mL 2   levocetirizine (XYZAL) 5 MG tablet Take 5 mg by mouth every evening.      loperamide (IMODIUM) 2 MG capsule Take 1 capsule (2 mg total) by mouth every 6 (six) hours. May start with 2 capsules initially 30 capsule 0   montelukast (SINGULAIR) 10 MG tablet Take 1 tablet (10 mg total) by mouth at bedtime. 30 tablet 5   Multiple Vitamin (MULTIVITAMIN PO) Take by mouth.     olopatadine (PATADAY) 0.1 % ophthalmic solution 1 drop 2 (two) times daily.     sertraline (ZOLOFT) 25 MG tablet Take 1 tablet (25 mg total) by mouth daily. 90 tablet 3   triamcinolone ointment (KENALOG) 0.1 % Apply 1 application topically 2 (two) times daily. 30 g 2   cephALEXin (KEFLEX) 500 MG capsule Take 1 capsule (500 mg total) by mouth 2 (two) times daily. 14 capsule 0   predniSONE (STERAPRED UNI-PAK 21 TAB) 10 MG (21) TBPK tablet Take by mouth daily as directed 21 tablet 0   No facility-administered medications prior to visit.    ROS Review of Systems  Constitutional:  Positive for unexpected weight change. Negative for chills, diaphoresis, fatigue and fever.  HENT: Negative.    Eyes: Negative.   Respiratory:  Negative for cough, chest tightness, shortness of breath and wheezing.   Cardiovascular:  Negative for chest pain, palpitations and leg swelling.  Gastrointestinal:  Positive for abdominal pain and nausea. Negative for constipation, diarrhea and vomiting.  Endocrine: Negative.   Genitourinary: Negative.  Negative for difficulty urinating, dysuria and hematuria.  Musculoskeletal:  Negative for  arthralgias.  Skin: Negative.   Neurological: Negative.  Negative for dizziness, weakness and light-headedness.  Hematological:  Negative for adenopathy. Does not bruise/bleed easily.  Psychiatric/Behavioral: Negative.      Objective:  BP 118/72 (BP Location: Right Arm, Patient Position: Sitting, Cuff Size: Large)   Pulse 92   Temp 98.3 F (36.8 C) (Oral)   Ht 5\' 5"  (1.651 m)   Wt 231 lb (104.8 kg)   LMP 12/17/2021   SpO2 98%   BMI 38.44 kg/m   BP Readings from Last 3 Encounters:   01/07/22 118/72  12/18/21 126/74  10/31/21 124/82    Wt Readings from Last 3 Encounters:  01/07/22 231 lb (104.8 kg)  12/18/21 235 lb (106.6 kg)  07/14/21 242 lb 6.4 oz (110 kg)    Physical Exam Vitals reviewed.  Constitutional:      Appearance: She is not ill-appearing.  HENT:     Nose: Nose normal.     Mouth/Throat:     Mouth: Mucous membranes are moist.  Eyes:     General: No scleral icterus.    Conjunctiva/sclera: Conjunctivae normal.  Cardiovascular:     Rate and Rhythm: Normal rate and regular rhythm.     Heart sounds: No murmur heard. Pulmonary:     Effort: Pulmonary effort is normal.     Breath sounds: No stridor. No wheezing, rhonchi or rales.  Abdominal:     General: Abdomen is flat.     Palpations: There is no hepatomegaly, splenomegaly or mass.     Tenderness: There is abdominal tenderness in the epigastric area. There is no guarding or rebound.     Hernia: No hernia is present.  Musculoskeletal:     Cervical back: Neck supple.  Lymphadenopathy:     Cervical: No cervical adenopathy.  Skin:    General: Skin is warm and dry.  Neurological:     General: No focal deficit present.     Mental Status: She is alert.  Psychiatric:        Mood and Affect: Mood normal.        Behavior: Behavior normal.     Lab Results  Component Value Date   WBC 8.3 01/07/2022   HGB 12.3 01/07/2022   HCT 37.5 01/07/2022   PLT 205.0 01/07/2022   GLUCOSE 90 12/18/2021   CHOL 186 04/21/2019   TRIG 132 04/21/2019   HDL 52 04/21/2019   LDLCALC 109 (H) 04/21/2019   ALT 32 01/07/2022   AST 25 01/07/2022   NA 136 12/18/2021   K 3.7 12/18/2021   CL 103 12/18/2021   CREATININE 0.75 12/18/2021   BUN 7 12/18/2021   CO2 27 12/18/2021   TSH 1.01 12/18/2021   HGBA1C 5.2 01/09/2014   DG ABD ACUTE 2+V W 1V CHEST  Result Date: 01/07/2022 CLINICAL DATA:  Epigastric pain EXAM: DG ABDOMEN ACUTE WITH 1 VIEW CHEST COMPARISON:  Chest x-ray dated December 18, 2021 FINDINGS: There is no  evidence of dilated bowel loops or free intraperitoneal air. No radiopaque calculi or other significant radiographic abnormality is seen. Heart size and mediastinal contours are within normal limits. Both lungs are clear. IMPRESSION: Negative abdominal radiographs.  No acute cardiopulmonary disease. Electronically Signed   By: December 20, 2021 M.D.   On: 01/07/2022 14:19     Assessment & Plan:   Ambria was seen today for abdominal pain.  Diagnoses and all orders for this visit:  Epigastric abdominal tenderness without rebound tenderness- She has tenderness to palpation and mildly  elevated liver enzymes.  Her other labs are reassuring.  Plain films are unremarkable.  I recommended a CT scan to see if there is peptic ulcer disease, malignancy, or bowel obstruction. -     Lipase; Future -     Amylase; Future -     CBC with Differential/Platelet; Future -     hCG, quantitative, pregnancy; Future -     Hepatic function panel; Future -     Urinalysis, Routine w reflex microscopic; Future -     DG ABD ACUTE 2+V W 1V CHEST; Future -     Urinalysis, Routine w reflex microscopic -     Hepatic function panel -     hCG, quantitative, pregnancy -     CBC with Differential/Platelet -     Amylase -     Lipase -     CT Abdomen Pelvis W Contrast; Future  Need for vaccination -     Pneumococcal conjugate vaccine 20-valent (Prevnar 20)  Moderate persistent asthma without complication- Her asthma is well controlled.   I have discontinued Jevaeh Pfenning's predniSONE and cephALEXin. I am also having her maintain her levocetirizine, Multiple Vitamin (MULTIVITAMIN PO), Eucrisa, triamcinolone ointment, fexofenadine, ipratropium, albuterol, ipratropium-albuterol, Pfizer COVID-19 Vac Bivalent, olopatadine, Trelegy Ellipta, montelukast, Azelastine-Fluticasone, sertraline, etonogestrel-ethinyl estradiol, albuterol, and loperamide.  No orders of the defined types were placed in this  encounter.    Follow-up: Return if symptoms worsen or fail to improve.  Sanda Linger, MD

## 2022-01-07 NOTE — Patient Instructions (Signed)

## 2022-01-13 ENCOUNTER — Other Ambulatory Visit: Payer: Self-pay | Admitting: Internal Medicine

## 2022-01-13 DIAGNOSIS — R10816 Epigastric abdominal tenderness: Secondary | ICD-10-CM

## 2022-01-14 ENCOUNTER — Other Ambulatory Visit (HOSPITAL_COMMUNITY): Payer: Self-pay

## 2022-01-22 ENCOUNTER — Ambulatory Visit (INDEPENDENT_AMBULATORY_CARE_PROVIDER_SITE_OTHER): Payer: No Typology Code available for payment source

## 2022-01-22 DIAGNOSIS — J309 Allergic rhinitis, unspecified: Secondary | ICD-10-CM | POA: Diagnosis not present

## 2022-01-28 ENCOUNTER — Ambulatory Visit (INDEPENDENT_AMBULATORY_CARE_PROVIDER_SITE_OTHER): Payer: No Typology Code available for payment source | Admitting: Family Medicine

## 2022-01-28 ENCOUNTER — Encounter: Payer: Self-pay | Admitting: Family Medicine

## 2022-01-28 VITALS — BP 116/68 | HR 70 | Temp 97.6°F | Ht 65.0 in | Wt 233.0 lb

## 2022-01-28 DIAGNOSIS — R109 Unspecified abdominal pain: Secondary | ICD-10-CM | POA: Diagnosis not present

## 2022-01-28 NOTE — Assessment & Plan Note (Signed)
She did not get CT abdomen due to her symptoms significantly improving with omeprazole.  She is now taking Pepcid as needed.  She will continue to avoid NSAIDs.  She will monitor her symptoms for now and may take Pepcid as needed.  If she is needing the medication regularly or having worsening pain or any new symptoms she will follow-up.

## 2022-01-28 NOTE — Progress Notes (Signed)
Subjective:     Patient ID: Kristina Stewart, female    DOB: 1985-10-19, 36 y.o.   MRN: [id number]  Chief Complaint  Patient presents with   Abdominal Pain    F/u on abdominal pain, mostly resolved, states she just feels unsettled because they never got to the root of what it was. Would just like to discuss    Abdominal Pain   Patient is in today for follow up on epigastric pain. States pain has improved significantly.   She took a 2 week trial of omeprazole and her symptoms resolved. Some intermittent pain returned after stopping the medication. She started taking Pepcid and pain resolved again.  She is taking Pepcid prn and this helps. Occasional mild breakthrough pain with coffee.   She was taking NSAIDs in June but not in July or currently. No regular alcohol use and does not smoke.   Stress level has improved.   Denies fever, chills, dizziness, chest pain, palpitations, shortness of breath, N/V/D, urinary symptoms.  No changes in bowel habits.   Neg RUQ [place] and plan film of her abdomen. Did not get CT abdomen since she was feeling better.     Health Maintenance Due  Topic Date Due   Hepatitis C Screening  Never done   INFLUENZA VACCINE  01/27/2022    Past Medical History:  Diagnosis Date   Allergy    Asthma    Family history of adverse reaction to anesthesia    sister problems with propofol   IBS (irritable bowel syndrome)    Obesity    Seasonal allergic rhinitis    Strep pharyngitis    TMJ (temporomandibular joint syndrome)    Vertigo     Past Surgical History:  Procedure Laterality Date   BRAIN SURGERY     craniotomy/arnold chiari malformation; craniotomy & laminectomy   BRAIN SURGERY     craniotomy/arnold chiari malformation    Family History  Problem Relation Age of Onset   Hypertension Mother    Thyroid disease Mother    Hyperlipidemia Mother    Neuropathy Mother    Hypertension Father    Irritable bowel syndrome Father    Hyperlipidemia  Father    GER disease Sister    Fragile X syndrome Sister    Alzheimer's disease Paternal Grandfather    Diabetes Paternal Grandmother    Fibromyalgia Paternal Grandmother    Thyroid disease Maternal Grandmother        HYPOTHYROID   Hypertension Maternal Grandmother    Cancer Maternal Grandmother        LUNG   Cancer Maternal Grandfather        LUNG   CAD Maternal Uncle     Social History   Socioeconomic History   Marital status: Married    Spouse name: Not on file   Number of children: 1   Years of education: Not on file   Highest education level: Master's degree (e.g., MA, MS, MEng, MEd, MSW, MBA)  Occupational History   Not on file  Tobacco Use   Smoking status: Never   Smokeless tobacco: Never  Vaping Use   Vaping Use: Never used  Substance and Sexual Activity   Alcohol use: Yes    Comment: 1 glass per month   Drug use: No   Sexual activity: Yes  Other Topics Concern   Not on file  Social History Narrative   Lives at home with husband & son   Right handed   Caffeine: none      **  Merged History Encounter **       Social Determinants of Health   Financial Resource Strain: Not on file  Food Insecurity: Not on file  Transportation Needs: Not on file  Physical Activity: Not on file  Stress: Not on file  Social Connections: Not on file  Intimate Partner Violence: Not on file    Outpatient Medications Prior to Visit  Medication Sig Dispense Refill   albuterol (PROVENTIL) (2.5 MG/3ML) 0.083% nebulizer solution Take 3 mLs (2.5 mg total) by nebulization every 6 (six) hours as needed for wheezing or shortness of breath (coughing fits). 75 mL 2   albuterol (VENTOLIN HFA) 108 (90 Base) MCG/ACT inhaler Inhale 2 puffs into the lungs every 6 (six) hours as needed for wheezing or shortness of breath. 6.7 g 0   Azelastine-Fluticasone 137-50 MCG/ACT SUSP Place 1 spray into the nose in the morning and at bedtime. 23 g 5   COVID-19 mRNA bivalent vaccine, Pfizer, (PFIZER  COVID-19 VAC BIVALENT) injection Inject into the muscle. 0.3 mL 0   Crisaborole (EUCRISA) 2 % OINT Apply 1 application topically 2 (two) times daily as needed. 100 g 5   etonogestrel-ethinyl estradiol (NUVARING) 0.12-0.015 MG/24HR vaginal ring Insert 1 ring vaginally every month 3 each 3   fexofenadine (ALLEGRA) 180 MG tablet Take 180 mg by mouth daily.     Fluticasone-Umeclidin-Vilant (TRELEGY ELLIPTA) 200-62.5-25 MCG/ACT AEPB Inhale 1 puff into the lungs daily. Rinse mouth after each use 60 each 5   ipratropium (ATROVENT) 0.03 % nasal spray Place 1-2 sprays into both nostrils 2 (two) times daily as needed (nasal drainage). 30 mL 5   ipratropium-albuterol (DUONEB) 0.5-2.5 (3) MG/3ML SOLN Take 3 mLs by nebulization every 4 (four) hours as needed (coughing fits, shortness of breath, wheezing). 180 mL 2   levocetirizine (XYZAL) 5 MG tablet Take 5 mg by mouth every evening.     loperamide (IMODIUM) 2 MG capsule Take 1 capsule (2 mg total) by mouth every 6 (six) hours. May start with 2 capsules initially 30 capsule 0   montelukast (SINGULAIR) 10 MG tablet Take 1 tablet (10 mg total) by mouth at bedtime. 30 tablet 5   Multiple Vitamin (MULTIVITAMIN PO) Take by mouth.     olopatadine (PATADAY) 0.1 % ophthalmic solution 1 drop 2 (two) times daily.     sertraline (ZOLOFT) 25 MG tablet Take 1 tablet (25 mg total) by mouth daily. 90 tablet 3   triamcinolone ointment (KENALOG) 0.1 % Apply 1 application topically 2 (two) times daily. 30 g 2   No facility-administered medications prior to visit.    Allergies  Allergen Reactions   Latex Rash   Trimethoprim    Almond (Diagnostic) Itching   Other Itching    PT IS ALLERGIC PITTED FRUITS    Review of Systems  Gastrointestinal:  Positive for abdominal pain.       Objective:    Physical Exam Constitutional:      General: She is not in acute distress.    Appearance: She is not ill-appearing.  Eyes:     Conjunctiva/sclera: Conjunctivae normal.      Pupils: Pupils are equal, round, and reactive to light.  Cardiovascular:     Rate and Rhythm: Normal rate and regular rhythm.     Pulses: Normal pulses.     Heart sounds: Normal heart sounds.  Pulmonary:     Effort: Pulmonary effort is normal.  Abdominal:     General: Bowel sounds are normal.     Palpations:  Abdomen is soft. There is no hepatomegaly, splenomegaly or mass.     Tenderness: There is no abdominal tenderness. There is no guarding or rebound. Negative signs include Murphy's sign and McBurney's sign.  Musculoskeletal:     Cervical back: Normal range of motion and neck supple.  Skin:    General: Skin is warm and dry.  Neurological:     Mental Status: She is alert and oriented to person, place, and time.  Psychiatric:        Attention and Perception: Attention normal.        Mood and Affect: Mood normal.        Speech: Speech normal.        Behavior: Behavior normal.        Cognition and Memory: Cognition normal.     BP 116/68 (BP Location: Left Arm, Patient Position: Sitting, Cuff Size: Large)   Pulse 70   Temp 97.6 F (36.4 C) (Temporal)   Ht 5\' 5"  (1.651 m)   Wt 233 lb (105.7 kg)   SpO2 99%   BMI 38.77 kg/m  Wt Readings from Last 3 Encounters:  01/28/22 233 lb (105.7 kg)  01/07/22 231 lb (104.8 kg)  12/18/21 235 lb (106.6 kg)       Assessment & Plan:   Problem List Items Addressed This Visit       Other   Intermittent abdominal pain - Primary    She did not get CT abdomen due to her symptoms significantly improving with omeprazole.  She is now taking Pepcid as needed.  She will continue to avoid NSAIDs.  She will monitor her symptoms for now and may take Pepcid as needed.  If she is needing the medication regularly or having worsening pain or any new symptoms she will follow-up.       I am having 12/20/21 maintain her levocetirizine, Multiple Vitamin (MULTIVITAMIN PO), Eucrisa, triamcinolone ointment, fexofenadine, ipratropium, albuterol,  ipratropium-albuterol, Pfizer COVID-19 Vac Bivalent, olopatadine, Trelegy Ellipta, montelukast, Azelastine-Fluticasone, sertraline, etonogestrel-ethinyl estradiol, albuterol, and loperamide.  No orders of the defined types were placed in this encounter.

## 2022-01-28 NOTE — Patient Instructions (Signed)
Continue monitoring your symptoms and take Pepcid prn.   Follow up if you are still needing Pepcid regularly or if you have any new or worsening symptoms and we will refer you to GI.

## 2022-02-02 ENCOUNTER — Ambulatory Visit (INDEPENDENT_AMBULATORY_CARE_PROVIDER_SITE_OTHER): Payer: No Typology Code available for payment source

## 2022-02-02 DIAGNOSIS — J309 Allergic rhinitis, unspecified: Secondary | ICD-10-CM | POA: Diagnosis not present

## 2022-02-03 ENCOUNTER — Other Ambulatory Visit: Payer: Self-pay | Admitting: Allergy

## 2022-02-03 ENCOUNTER — Other Ambulatory Visit (HOSPITAL_COMMUNITY): Payer: Self-pay

## 2022-02-03 MED ORDER — MONTELUKAST SODIUM 10 MG PO TABS
10.0000 mg | ORAL_TABLET | Freq: Every day | ORAL | 0 refills | Status: DC
Start: 2022-02-03 — End: 2022-05-25
  Filled 2022-02-03 – 2022-04-23 (×2): qty 30, 30d supply, fill #0

## 2022-02-04 ENCOUNTER — Encounter: Payer: No Typology Code available for payment source | Admitting: Internal Medicine

## 2022-03-03 ENCOUNTER — Telehealth: Payer: Self-pay

## 2022-03-03 ENCOUNTER — Ambulatory Visit (INDEPENDENT_AMBULATORY_CARE_PROVIDER_SITE_OTHER): Payer: No Typology Code available for payment source

## 2022-03-03 DIAGNOSIS — J309 Allergic rhinitis, unspecified: Secondary | ICD-10-CM | POA: Diagnosis not present

## 2022-03-03 NOTE — Telephone Encounter (Signed)
Patient came in today and expressed that she was having trouble coming in to get her injection due to her work schedule. I informed patient that if she could find someone to give her her injections who has a medical back ground we could send her vials to her place of employment so that she would be on track with her injections. 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 Patient was given the paperwork for outside vial transfer. Thurston Hole signed the papers. I informed patient that once the papers are competed and we receive them we would schedule her an appointment to take her vials out. Patient verbalized understanding and agreed to do so.

## 2022-03-11 ENCOUNTER — Ambulatory Visit: Payer: No Typology Code available for payment source | Admitting: Allergy

## 2022-03-12 ENCOUNTER — Other Ambulatory Visit (HOSPITAL_COMMUNITY): Payer: Self-pay

## 2022-03-13 ENCOUNTER — Other Ambulatory Visit (HOSPITAL_COMMUNITY): Payer: Self-pay

## 2022-03-15 ENCOUNTER — Telehealth: Payer: No Typology Code available for payment source | Admitting: Physician Assistant

## 2022-03-15 DIAGNOSIS — J208 Acute bronchitis due to other specified organisms: Secondary | ICD-10-CM | POA: Diagnosis not present

## 2022-03-15 DIAGNOSIS — B9689 Other specified bacterial agents as the cause of diseases classified elsewhere: Secondary | ICD-10-CM

## 2022-03-16 ENCOUNTER — Other Ambulatory Visit (HOSPITAL_COMMUNITY): Payer: Self-pay

## 2022-03-16 MED ORDER — AZITHROMYCIN 250 MG PO TABS
ORAL_TABLET | ORAL | 0 refills | Status: AC
  Filled 2022-03-16: qty 6, 5d supply, fill #0

## 2022-03-16 MED ORDER — BENZONATATE 100 MG PO CAPS
100.0000 mg | ORAL_CAPSULE | Freq: Three times a day (TID) | ORAL | 0 refills | Status: DC | PRN
  Filled 2022-03-16: qty 30, 10d supply, fill #0

## 2022-03-16 NOTE — Progress Notes (Signed)
I have spent 5 minutes in review of e-visit questionnaire, review and updating patient chart, medical decision making and response to patient.   Crystalina Stodghill Cody Kevan Prouty, PA-C    

## 2022-03-16 NOTE — Progress Notes (Signed)

## 2022-03-17 ENCOUNTER — Other Ambulatory Visit (HOSPITAL_COMMUNITY): Payer: Self-pay

## 2022-03-17 NOTE — Progress Notes (Unsigned)
RE: Kristina Stewart MRN: [id number] DOB: March 04, 1986 Date of Telemedicine Visit: 03/18/2022  Referring provider: Etta Grandchild, MD Primary care provider: Avanell Shackleton, NP-C  Chief Complaint: No chief complaint on file.   Telemedicine Follow Up Visit via Telephone: I connected with [name] for a follow up on 03/17/22 by telephone and verified that I am speaking with the correct person using two identifiers.   I discussed the limitations, risks, security and privacy concerns of performing an evaluation and management service by telephone and the availability of in person appointments. I also discussed with the patient that there may be a patient responsible charge related to this service. The patient expressed understanding and agreed to proceed.  Patient is at home/work accompanied by *** who provided/contributed to the history.  Provider is at the office.  Visit start time: *** Visit end time: *** Insurance consent/check in by: front desk Medical consent and medical assistant/nurse: ***  History of Present Illness: She is a 36 y.o. female, who is being followed for asthma, allergic rhinoconjunctivitis on AIT, frequent infections, dyshidrotic eczema, oral allergy syndrome. Her previous allergy office visit was on 07/14/2021 with Dr. Selena Batten. Today is a regular follow up visit.  Moderate persistent asthma without complication Past history - Diagnosed with asthma in middle school and was doing well up until a few years ago.  Main triggers include allergies, weather change, infection, strong scents and perfumes.  During these flares she takes Advair 250 1 puff twice a day, Singulair 10 mg daily and albuterol as needed with good benefit. 2020 spirometry was normal. Interim history - increased symptoms x 1 month.  Today's spirometry was normal but not as good as prior one.  Read about Dupixent injections - approved for asthma and eczema, handout given.  Daily controller  medication(s):  START Trelegy 1 puff once a day and rinse mouth after each use. Sample given. This replaces Advair.  May use albuterol rescue inhaler 2 puffs or nebulizer every 4 to 6 hours as needed for shortness of breath, chest tightness, coughing, and wheezing. May use albuterol rescue inhaler 2 puffs 5 to 15 minutes prior to strenuous physical activities. Monitor frequency of use.  Get spirometry at next visit.   Seasonal and perennial allergic rhinoconjunctivitis Past history - Perennial rhinoconjunctivitis symptoms for the last 10+ years with worsening during change of season. 2020 skin testing showed: Positive to grass, weed, ragweed, trees, mold. Started AIT on 07/06/2019 (MOLD, [id number]) and stopped due to pregnancy. Interim history - restarted injections. Having PND. Continue environmental control measures.  Start Ryaltris (olopatadine + mometasone nasal spray combination) 1-2 sprays per nostril twice a day. Sample given. This replaces dymista. Use Atrovent (ipratropium) 0.03% 1-2 sprays per nostril twice a day as needed for runny nose/drainage. Nasal saline spray (i.e., Simply Saline) or nasal saline lavage (i.e., NeilMed) is recommended as needed and prior to medicated nasal sprays. Use over the counter antihistamines such as Zyrtec (cetirizine), Claritin (loratadine), Allegra (fexofenadine), or Xyzal (levocetirizine) daily as needed. May take twice a day during allergy flares. May switch antihistamines every few months. Continue Singulair (montelukast) 10mg  daily at night. Continue allergy injections - okay to get as long as you didn't use your albuterol the day before or the day of injection.    History of frequent upper respiratory infection Past history - 4 sinus infections per year. Wants to know if she needs a pneumonia vaccine. Interim history - didn't get bloodwork yet. Currently on doxy. Keep track  of infections and antibiotics use. Get bloodwork to look at immune  system when not feeling ill.    Dyshidrotic eczema Past history - Dyshidrotic eczema on the palms bilaterally.  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   Interim history - waxes and wanes. Use triamcinolone 0.1% ointment twice a day as needed for eczema flares. Do not use on the face, neck, armpits or groin area. Do not use more than 3 weeks in a row.  Use Eucrisa (crisaborole) 2% ointment twice a day on mild rash flares on the face and body. This is a non-steroid ointment.  Continue proper skin care.   Pollen-food allergy Past history - Certain raw fruits and almonds cause perioral pruritus.  Tolerates processed and cooked forms with no issues.  Patient questionably had a reaction to pistachios but it was cross contaminated almonds in the past.  She tolerates other tree nuts including cashews, walnuts, pecans and hazelnuts with no issues. 2020 skin testing was borderline positive to almonds and negative pistachios. Okay with pistachios and nectarines.  May try almonds at home in little doses. For mild symptoms you can take over the counter antihistamines such as Benadryl and monitor symptoms closely. If symptoms worsen or if you have severe symptoms including breathing issues, throat closure, significant swelling, whole body hives, severe diarrhea and vomiting, lightheadedness then inject epinephrine and seek immediate medical care afterwards.   Return in about 4 months (around 11/11/2021).  Assessment and Plan: Cloris is a 36 y.o. female with: No problem-specific Assessment & Plan notes found for this encounter.  No follow-ups on file.  No orders of the defined types were placed in this encounter.  Lab Orders  No laboratory test(s) ordered today    Diagnostics: None.  Medication List:  Current Outpatient Medications  Medication Sig Dispense Refill  . albuterol (PROVENTIL) (2.5 MG/3ML) 0.083% nebulizer solution Take 3 mLs (2.5 mg total)  by nebulization every 6 (six) hours as needed for wheezing or shortness of breath (coughing fits). 75 mL 2  . albuterol (VENTOLIN HFA) 108 (90 Base) MCG/ACT inhaler Inhale 2 puffs into the lungs every 6 (six) hours as needed for wheezing or shortness of breath. 6.7 g 0  . Azelastine-Fluticasone 137-50 MCG/ACT SUSP Place 1 spray into the nose in the morning and at bedtime. 23 g 5  . azithromycin (ZITHROMAX) 250 MG tablet Take 2 tablets on day 1, then 1 tablet daily on days 2 through 5 6 tablet 0  . benzonatate (TESSALON) 100 MG capsule Take 1 capsule (100 mg total) by mouth 3 (three) times daily as needed for cough. 30 capsule 0  . COVID-19 mRNA bivalent vaccine, Pfizer, (PFIZER COVID-19 VAC BIVALENT) injection Inject into the muscle. 0.3 mL 0  . Crisaborole (EUCRISA) 2 % OINT Apply 1 application topically 2 (two) times daily as needed. 100 g 5  . etonogestrel-ethinyl estradiol (NUVARING) 0.12-0.015 MG/24HR vaginal ring Insert 1 ring vaginally every month 3 each 3  . fexofenadine (ALLEGRA) 180 MG tablet Take 180 mg by mouth daily.    . Fluticasone-Umeclidin-Vilant (TRELEGY ELLIPTA) 200-62.5-25 MCG/ACT AEPB Inhale 1 puff into the lungs daily. Rinse mouth after each use 60 each 5  . ipratropium (ATROVENT) 0.03 % nasal spray Place 1-2 sprays into both nostrils 2 (two) times daily as needed (nasal drainage). 30 mL 5  . ipratropium-albuterol (DUONEB) 0.5-2.5 (3) MG/3ML SOLN Take 3 mLs by nebulization every 4 (four) hours as needed (coughing fits, shortness of breath,  wheezing). 180 mL 2  . levocetirizine (XYZAL) 5 MG tablet Take 5 mg by mouth every evening.    . loperamide (IMODIUM) 2 MG capsule Take 1 capsule (2 mg total) by mouth every 6 (six) hours. May start with 2 capsules initially 30 capsule 0  . montelukast (SINGULAIR) 10 MG tablet Take 1 tablet (10 mg total) by mouth at bedtime. 30 tablet 0  . Multiple Vitamin (MULTIVITAMIN PO) Take by mouth.    Marland Kitchen olopatadine (PATADAY) 0.1 % ophthalmic solution 1  drop 2 (two) times daily.    . sertraline (ZOLOFT) 25 MG tablet Take 1 tablet (25 mg total) by mouth daily. 90 tablet 3  . triamcinolone ointment (KENALOG) 0.1 % Apply 1 application topically 2 (two) times daily. 30 g 2   No current facility-administered medications for this visit.   Allergies: Allergies  Allergen Reactions  . Latex Rash  . Trimethoprim   . Almond (Diagnostic) Itching  . Other Itching    PT IS ALLERGIC PITTED FRUITS   I reviewed her past medical history, social history, family history, and environmental history and no significant changes have been reported from her previous visit.  Review of Systems  Constitutional:  Negative for appetite change, chills, fever and unexpected weight change.  HENT:  Positive for congestion, postnasal drip and sinus pressure. Negative for rhinorrhea and sneezing.   Eyes:  Negative for itching.  Respiratory:  Positive for cough. Negative for chest tightness, shortness of breath and wheezing.   Cardiovascular:  Negative for chest pain.  Gastrointestinal:  Negative for abdominal pain.  Genitourinary:  Negative for difficulty urinating.  Skin:  Positive for rash.  Allergic/Immunologic: Positive for environmental allergies and food allergies.  Neurological:  Positive for headaches.   Objective: Physical Exam Not obtained as encounter was done via telephone.   Previous notes and tests were reviewed.  I discussed the assessment and treatment plan with the patient. The patient was provided an opportunity to ask questions and all were answered. The patient agreed with the plan and demonstrated an understanding of the instructions. After visit summary/patient instructions available via {Blank single:19197::"e-mail","mychart"}.   The patient was advised to call back or seek an in-person evaluation if the symptoms worsen or if the condition fails to improve as anticipated.  I provided *** minutes of non-face-to-face time during this  encounter.  It was my pleasure to participate in [place]uano's care today. Please feel free to contact me with any questions or concerns.   Sincerely,  Rexene Alberts, DO Allergy & Immunology  [facility] office: [place] office: [phone]

## 2022-03-18 ENCOUNTER — Encounter: Payer: Self-pay | Admitting: Allergy

## 2022-03-18 ENCOUNTER — Ambulatory Visit: Payer: No Typology Code available for payment source | Admitting: Allergy

## 2022-03-18 ENCOUNTER — Other Ambulatory Visit (HOSPITAL_COMMUNITY): Payer: Self-pay

## 2022-03-18 ENCOUNTER — Ambulatory Visit (INDEPENDENT_AMBULATORY_CARE_PROVIDER_SITE_OTHER): Payer: No Typology Code available for payment source | Admitting: Allergy

## 2022-03-18 DIAGNOSIS — Z8709 Personal history of other diseases of the respiratory system: Secondary | ICD-10-CM | POA: Diagnosis not present

## 2022-03-18 DIAGNOSIS — H1013 Acute atopic conjunctivitis, bilateral: Secondary | ICD-10-CM | POA: Diagnosis not present

## 2022-03-18 DIAGNOSIS — T781XXD Other adverse food reactions, not elsewhere classified, subsequent encounter: Secondary | ICD-10-CM

## 2022-03-18 DIAGNOSIS — J4540 Moderate persistent asthma, uncomplicated: Secondary | ICD-10-CM | POA: Diagnosis not present

## 2022-03-18 DIAGNOSIS — J302 Other seasonal allergic rhinitis: Secondary | ICD-10-CM

## 2022-03-18 DIAGNOSIS — L301 Dyshidrosis [pompholyx]: Secondary | ICD-10-CM

## 2022-03-18 DIAGNOSIS — H1010 Acute atopic conjunctivitis, unspecified eye: Secondary | ICD-10-CM

## 2022-03-18 MED ORDER — BUDESONIDE 0.5 MG/2ML IN SUSP
0.5000 mg | Freq: Two times a day (BID) | RESPIRATORY_TRACT | 2 refills | Status: AC
Start: 2022-03-18 — End: ?
  Filled 2022-03-18: qty 120, 30d supply, fill #0

## 2022-03-18 NOTE — Assessment & Plan Note (Signed)
Past history - Diagnosed with asthma in middle school and was doing well up until a few years ago.  Main triggers include allergies, weather change, infection, strong scents and perfumes.  During these flares she takes Advair 250 1 puff twice a day, Singulair 10 mg daily and albuterol as needed with good benefit. 2020 spirometry was normal. Interim history - doing better with Trelegy, 1 prednisone in June for asthma flare.  . Daily controller medication(s):  o Continue Trelegy 247mcg 1 puff once a day and rinse mouth after each use.  . During upper respiratory infections/flares:  . Start Pulmicort (budesonide) 0.5mg  nebulizer twice a day for 1-2 weeks until your breathing symptoms return to baseline.  . Pretreat with albuterol 2 puffs or albuterol nebulizer.  . If you need to use your albuterol nebulizer machine back to back within 15-30 minutes with no relief then please go to the ER/urgent care for further evaluation.  . May use albuterol rescue inhaler 2 puffs or nebulizer every 4 to 6 hours as needed for shortness of breath, chest tightness, coughing, and wheezing. May use albuterol rescue inhaler 2 puffs 5 to 15 minutes prior to strenuous physical activities. Monitor frequency of use.   Get spirometry at next visit.

## 2022-03-18 NOTE — Assessment & Plan Note (Signed)
Past history - Dyshidrotic eczema on the palms bilaterally.  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   Interim history - no recent flare.   Use triamcinolone 0.1% ointment twice a day as needed for eczema flares. Do not use on the face, neck, armpits or groin area. Do not use more than 3 weeks in a row.   Use Eucrisa (crisaborole) 2% ointment twice a day on mild rash flares on the face and body. This is a non-steroid ointment.   Continue proper skin care.

## 2022-03-18 NOTE — Patient Instructions (Addendum)
Asthma Daily controller medication(s):  Continue Trelegy 264mcg 1 puff once a day and rinse mouth after each use.  During upper respiratory infections/flares:  Start Pulmicort (budesonide) 0.5mg  nebulizer twice a day for 1-2 weeks until your breathing symptoms return to baseline.  Pretreat with albuterol 2 puffs or albuterol nebulizer.  If you need to use your albuterol nebulizer machine back to back within 15-30 minutes with no relief then please go to the ER/urgent care for further evaluation.  May use albuterol rescue inhaler 2 puffs or nebulizer every 4 to 6 hours as needed for shortness of breath, chest tightness, coughing, and wheezing. May use albuterol rescue inhaler 2 puffs 5 to 15 minutes prior to strenuous physical activities. Monitor frequency of use.  Asthma control goals:  Full participation in all desired activities (may need albuterol before activity) Albuterol use two times or less a week on average (not counting use with activity) Cough interfering with sleep two times or less a month Oral steroids no more than once a year No hospitalizations   Allergic conjunctivitis Past skin testing showed: Positive to grass, weed, ragweed, trees, mold.  Continue environmental control measures. Continue dymista (fluticasone + azelastine nasal spray combination) 1 spray per nostril twice a day. Use Atrovent (ipratropium) 0.03% 1-2 sprays per nostril twice a day as needed for runny nose/drainage. Nasal saline spray (i.e., Simply Saline) or nasal saline lavage (i.e., NeilMed) is recommended as needed and prior to medicated nasal sprays. Use over the counter antihistamines such as Zyrtec (cetirizine), Claritin (loratadine), Allegra (fexofenadine), or Xyzal (levocetirizine) daily as needed. May take twice a day during allergy flares. May switch antihistamines every few months. Continue Singulair (montelukast) 10mg  daily at night. Continue allergy injections - okay to get as long as you didn't  use your albuterol the day before or the day of injection.   Infections Keep track of infections and antibiotics use. Get bloodwork - lab orders mailed to your home.  We are ordering labs, so please allow 1-2 weeks for the results to come back. With the newly implemented Cures Act, the labs might be visible to you at the same time that they become visible to me. However, I will not address the results until all of the results are back, so please be patient.    Pollen-food allergy Avoid foods that are bothersome. For mild symptoms you can take over the counter antihistamines such as Benadryl and monitor symptoms closely. If symptoms worsen or if you have severe symptoms including breathing issues, throat closure, significant swelling, whole body hives, severe diarrhea and vomiting, lightheadedness then inject epinephrine and seek immediate medical care afterwards.   Dyshidrotic eczema Use triamcinolone 0.1% ointment twice a day as needed for eczema flares. Do not use on the face, neck, armpits or groin area. Do not use more than 3 weeks in a row.  Use Eucrisa (crisaborole) 2% ointment twice a day on mild rash flares on the face and body. This is a non-steroid ointment.  If it burns, place the medication in the refrigerator.  Apply a thin layer of moisturizer and then apply the Eucrisa on top of it. Continue proper skin care.  Follow up in 4 months or sooner if needed.  Our [place] office is moving in September 2023 to a new location. New address: [address] [place], [place], [place] [address] (white building). [place] office: [phone] (same phone number).

## 2022-03-18 NOTE — Assessment & Plan Note (Signed)
Past history - Certain raw fruits and almonds cause perioral pruritus.  Tolerates processed and cooked forms with no issues.  Patient questionably had a reaction to pistachios but it was cross contaminated almonds in the past.  She tolerates other tree nuts including cashews, walnuts, pecans and hazelnuts with no issues. 2020 skin testing was borderline positive to almonds and negative pistachios. Okay with pistachios and nectarines.   For mild symptoms you can take over the counter antihistamines such as Benadryl and monitor symptoms closely. If symptoms worsen or if you have severe symptoms including breathing issues, throat closure, significant swelling, whole body hives, severe diarrhea and vomiting, lightheadedness then inject epinephrine and seek immediate medical care afterwards.

## 2022-03-18 NOTE — Assessment & Plan Note (Signed)
Past history - Perennial rhinoconjunctivitis symptoms for the last 10+ years with worsening during change of season. 2020 skin testing showed: Positive to grass, weed, ragweed, trees, mold. Started AIT on 07/06/2019 (MOLD, [id number]) and stopped due to pregnancy. Interim history - restarted injections but having hard time to come weekly.  Continue environmental control measures.  Continue dymista (fluticasone + azelastine nasal spray combination) 1 spray per nostril twice a day.  Use Atrovent (ipratropium) 0.03% 1-2 sprays per nostril twice a day as needed for runny nose/drainage.  Nasal saline spray (i.e., Simply Saline) or nasal saline lavage (i.e., NeilMed) is recommended as needed and prior to medicated nasal sprays.  Use over the counter antihistamines such as Zyrtec (cetirizine), Claritin (loratadine), Allegra (fexofenadine), or Xyzal (levocetirizine) daily as needed. May take twice a day during allergy flares. May switch antihistamines every few months.  Continue Singulair (montelukast) 10mg  daily at night.  Continue allergy injections - okay to get as long as you didn't use your albuterol the day before or the day of injection.

## 2022-03-18 NOTE — Assessment & Plan Note (Addendum)
Past history - 4 sinus infections per year. Wants to know if she needs a pneumonia vaccine. Interim history - didn't get bloodwork yet. Currently on zpak. 3 antibiotics this year. 2 young kids at home. Marland Kitchen Keep track of infections and antibiotics use. . Get bloodwork to look at immune system.

## 2022-03-24 ENCOUNTER — Other Ambulatory Visit (HOSPITAL_COMMUNITY): Payer: Self-pay

## 2022-04-07 ENCOUNTER — Other Ambulatory Visit: Payer: Self-pay | Admitting: Allergy

## 2022-04-07 ENCOUNTER — Other Ambulatory Visit (HOSPITAL_COMMUNITY): Payer: Self-pay

## 2022-04-07 MED ORDER — ALBUTEROL SULFATE HFA 108 (90 BASE) MCG/ACT IN AERS
2.0000 | INHALATION_SPRAY | RESPIRATORY_TRACT | 1 refills | Status: DC | PRN
  Filled 2022-04-07: qty 6.7, 17d supply, fill #0

## 2022-04-07 MED ORDER — IPRATROPIUM-ALBUTEROL 0.5-2.5 (3) MG/3ML IN SOLN
3.0000 mL | RESPIRATORY_TRACT | 2 refills | Status: AC | PRN
  Filled 2022-04-07: qty 180, 10d supply, fill #0
  Filled 2023-03-31: qty 180, 10d supply, fill #1

## 2022-04-15 ENCOUNTER — Other Ambulatory Visit (HOSPITAL_COMMUNITY): Payer: Self-pay

## 2022-04-15 ENCOUNTER — Other Ambulatory Visit: Payer: Self-pay | Admitting: Allergy

## 2022-04-15 MED ORDER — TRELEGY ELLIPTA 200-62.5-25 MCG/ACT IN AEPB
1.0000 | INHALATION_SPRAY | Freq: Every day | RESPIRATORY_TRACT | 2 refills | Status: DC
  Filled 2022-04-15: qty 60, 30d supply, fill #0
  Filled 2022-05-25: qty 60, 30d supply, fill #1
  Filled 2022-06-23: qty 60, 30d supply, fill #2

## 2022-04-20 ENCOUNTER — Other Ambulatory Visit (HOSPITAL_COMMUNITY): Payer: Self-pay

## 2022-04-23 ENCOUNTER — Other Ambulatory Visit (HOSPITAL_COMMUNITY): Payer: Self-pay

## 2022-04-24 ENCOUNTER — Other Ambulatory Visit (HOSPITAL_COMMUNITY): Payer: Self-pay

## 2022-05-25 ENCOUNTER — Other Ambulatory Visit: Payer: Self-pay | Admitting: Allergy

## 2022-05-26 ENCOUNTER — Other Ambulatory Visit (HOSPITAL_COMMUNITY): Payer: Self-pay

## 2022-05-26 MED ORDER — MONTELUKAST SODIUM 10 MG PO TABS
10.0000 mg | ORAL_TABLET | Freq: Every day | ORAL | 5 refills | Status: DC
  Filled 2022-05-26: qty 30, 30d supply, fill #0
  Filled 2022-06-23: qty 30, 30d supply, fill #1
  Filled 2022-07-28: qty 30, 30d supply, fill #2
  Filled 2022-09-02: qty 30, 30d supply, fill #3
  Filled 2022-09-30: qty 30, 30d supply, fill #4

## 2022-05-28 ENCOUNTER — Ambulatory Visit (INDEPENDENT_AMBULATORY_CARE_PROVIDER_SITE_OTHER): Payer: No Typology Code available for payment source

## 2022-05-28 DIAGNOSIS — J309 Allergic rhinitis, unspecified: Secondary | ICD-10-CM

## 2022-06-04 ENCOUNTER — Ambulatory Visit (INDEPENDENT_AMBULATORY_CARE_PROVIDER_SITE_OTHER): Payer: No Typology Code available for payment source

## 2022-06-04 DIAGNOSIS — J309 Allergic rhinitis, unspecified: Secondary | ICD-10-CM | POA: Diagnosis not present

## 2022-06-23 ENCOUNTER — Other Ambulatory Visit: Payer: Self-pay | Admitting: Allergy

## 2022-06-23 ENCOUNTER — Other Ambulatory Visit (HOSPITAL_COMMUNITY): Payer: Self-pay

## 2022-06-23 ENCOUNTER — Telehealth: Payer: No Typology Code available for payment source | Admitting: Physician Assistant

## 2022-06-23 ENCOUNTER — Other Ambulatory Visit: Payer: Self-pay

## 2022-06-23 DIAGNOSIS — J019 Acute sinusitis, unspecified: Secondary | ICD-10-CM | POA: Diagnosis not present

## 2022-06-23 DIAGNOSIS — B9689 Other specified bacterial agents as the cause of diseases classified elsewhere: Secondary | ICD-10-CM | POA: Diagnosis not present

## 2022-06-23 MED ORDER — AMOXICILLIN-POT CLAVULANATE 875-125 MG PO TABS
1.0000 | ORAL_TABLET | Freq: Two times a day (BID) | ORAL | 0 refills | Status: DC
  Filled 2022-06-23: qty 14, 7d supply, fill #0

## 2022-06-23 NOTE — Progress Notes (Signed)

## 2022-06-23 NOTE — Progress Notes (Signed)
I have spent 5 minutes in review of e-visit questionnaire, review and updating patient chart, medical decision making and response to patient.   Antionetta Ator Cody Skyler Dusing, PA-C    

## 2022-06-24 ENCOUNTER — Other Ambulatory Visit (HOSPITAL_COMMUNITY): Payer: Self-pay

## 2022-06-24 MED ORDER — AZELASTINE-FLUTICASONE 137-50 MCG/ACT NA SUSP
1.0000 | Freq: Two times a day (BID) | NASAL | 0 refills | Status: DC
  Filled 2022-06-24: qty 23, 30d supply, fill #0

## 2022-06-30 ENCOUNTER — Ambulatory Visit (INDEPENDENT_AMBULATORY_CARE_PROVIDER_SITE_OTHER): Payer: 59

## 2022-06-30 DIAGNOSIS — J309 Allergic rhinitis, unspecified: Secondary | ICD-10-CM | POA: Diagnosis not present

## 2022-07-10 ENCOUNTER — Ambulatory Visit (INDEPENDENT_AMBULATORY_CARE_PROVIDER_SITE_OTHER): Payer: 59

## 2022-07-10 DIAGNOSIS — Z8709 Personal history of other diseases of the respiratory system: Secondary | ICD-10-CM | POA: Diagnosis not present

## 2022-07-10 DIAGNOSIS — J309 Allergic rhinitis, unspecified: Secondary | ICD-10-CM | POA: Diagnosis not present

## 2022-07-14 ENCOUNTER — Encounter: Payer: Self-pay | Admitting: Family Medicine

## 2022-07-14 NOTE — Telephone Encounter (Signed)
Pt was seen 01/28/2022 for continued intermittent abdominal pain and check out note states new or worsening symptoms we will refer to GI. [place] for referral or would you like OV?

## 2022-07-17 ENCOUNTER — Telehealth: Payer: 59 | Admitting: Physician Assistant

## 2022-07-17 LAB — STREP PNEUMONIAE 23 SEROTYPES IGG
Pneumo Ab Type 1*: 4.8 ug/mL (ref >1.3)
Pneumo Ab Type 12 (12F)*: 0.7 ug/mL — ABNORMAL LOW (ref >1.3)
Pneumo Ab Type 14*: 1.7 ug/mL (ref >1.3)
Pneumo Ab Type 17 (17F)*: 0.1 ug/mL — ABNORMAL LOW (ref >1.3)
Pneumo Ab Type 19 (19F)*: 20.9 ug/mL (ref >1.3)
Pneumo Ab Type 2*: 1.6 ug/mL (ref >1.3)
Pneumo Ab Type 20*: 2.7 ug/mL (ref >1.3)
Pneumo Ab Type 22 (22F)*: 2.6 ug/mL (ref >1.3)
Pneumo Ab Type 23 (23F)*: 1.5 ug/mL (ref >1.3)
Pneumo Ab Type 26 (6B)*: 0.4 ug/mL — ABNORMAL LOW (ref >1.3)
Pneumo Ab Type 3*: 1.1 ug/mL — ABNORMAL LOW (ref >1.3)
Pneumo Ab Type 34 (10A)*: 4 ug/mL (ref >1.3)
Pneumo Ab Type 4*: 2.1 ug/mL (ref >1.3)
Pneumo Ab Type 43 (11A)*: 0.4 ug/mL — ABNORMAL LOW (ref >1.3)
Pneumo Ab Type 5*: 0.3 ug/mL — ABNORMAL LOW (ref >1.3)
Pneumo Ab Type 51 (7F)*: 0.5 ug/mL — ABNORMAL LOW (ref >1.3)
Pneumo Ab Type 54 (15B)*: 2.2 ug/mL (ref >1.3)
Pneumo Ab Type 56 (18C)*: 4.1 ug/mL (ref >1.3)
Pneumo Ab Type 57 (19A)*: 5.7 ug/mL (ref >1.3)
Pneumo Ab Type 68 (9V)*: 0.7 ug/mL — ABNORMAL LOW (ref >1.3)
Pneumo Ab Type 70 (33F)*: 0.8 ug/mL — ABNORMAL LOW (ref >1.3)
Pneumo Ab Type 8*: 1.3 ug/mL — ABNORMAL LOW (ref >1.3)
Pneumo Ab Type 9 (9N)*: 1.3 ug/mL — ABNORMAL LOW (ref >1.3)

## 2022-07-17 LAB — CBC WITH DIFFERENTIAL/PLATELET
Basophils Absolute: 0 10*3/uL (ref 0.0–0.2)
Basos: 0 %
EOS (ABSOLUTE): 0.1 10*3/uL (ref 0.0–0.4)
Eos: 1 %
Hematocrit: 40.6 % (ref 34.0–46.6)
Hemoglobin: 13.3 g/dL (ref 11.1–15.9)
Immature Grans (Abs): 0 10*3/uL (ref 0.0–0.1)
Immature Granulocytes: 0 %
Lymphocytes Absolute: 2.4 10*3/uL (ref 0.7–3.1)
Lymphs: 32 %
MCH: 27.7 pg (ref 26.6–33.0)
MCHC: 32.8 g/dL (ref 31.5–35.7)
MCV: 85 fL (ref 79–97)
Monocytes Absolute: 0.4 10*3/uL (ref 0.1–0.9)
Monocytes: 5 %
Neutrophils Absolute: 4.7 10*3/uL (ref 1.4–7.0)
Neutrophils: 62 %
Platelets: 301 10*3/uL (ref 150–450)
RBC: 4.8 x10E6/uL (ref 3.77–5.28)
RDW: 13.6 % (ref 11.7–15.4)
WBC: 7.6 10*3/uL (ref 3.4–10.8)

## 2022-07-17 LAB — IGG, IGA, IGM
IgA/Immunoglobulin A, Serum: 174 mg/dL (ref 87–352)
IgG (Immunoglobin G), Serum: 999 mg/dL (ref 586–1602)
IgM (Immunoglobulin M), Srm: 99 mg/dL (ref 26–217)

## 2022-07-17 LAB — COMPLEMENT, TOTAL: Compl, Total (CH50): >60 U/mL (ref >41)

## 2022-07-17 LAB — DIPHTHERIA / TETANUS ANTIBODY PANEL
Diphtheria Ab: 2.88 IU/mL (ref <0.10)
Tetanus Ab, IgG: 3.15 IU/mL (ref <0.10)

## 2022-07-17 NOTE — Progress Notes (Signed)
The patient no-showed for appointment despite this provider sending direct link x 2 with no response and waiting for at least 10 minutes from appointment time for patient to join. They will be marked as a NS for this appointment/time.   Kristina Wafer M Indyah Saulnier, PA-C    

## 2022-07-18 ENCOUNTER — Other Ambulatory Visit (HOSPITAL_COMMUNITY): Payer: Self-pay

## 2022-07-18 ENCOUNTER — Telehealth: Payer: 59 | Admitting: Nurse Practitioner

## 2022-07-18 DIAGNOSIS — K253 Acute gastric ulcer without hemorrhage or perforation: Secondary | ICD-10-CM | POA: Diagnosis not present

## 2022-07-18 MED ORDER — LANSOPRAZOLE 30 MG PO CPDR
30.0000 mg | DELAYED_RELEASE_CAPSULE | Freq: Every day | ORAL | 0 refills | Status: DC
  Filled 2022-07-18: qty 30, 30d supply, fill #0

## 2022-07-18 NOTE — Progress Notes (Signed)
Virtual Visit Consent   Kristina Stewart, you are scheduled for a virtual visit with Kristina Stewart, [place], a [facility] provider, today.     Just as with appointments in the office, your consent must be obtained to participate.  Your consent will be active for this visit and any virtual visit you may have with one of our providers in the next 365 days.     If you have a MyChart account, a copy of this consent can be sent to you electronically.  All virtual visits are billed to your insurance company just like a traditional visit in the office.    As this is a virtual visit, video technology does not allow for your provider to perform a traditional examination.  This may limit your provider's ability to fully assess your condition.  If your provider identifies any concerns that need to be evaluated in person or the need to arrange testing (such as labs, EKG, etc.), we will make arrangements to do so.     Although advances in technology are sophisticated, we cannot ensure that it will always work on either your end or our end.  If the connection with a video visit is poor, the visit may have to be switched to a telephone visit.  With either a video or telephone visit, we are not always able to ensure that we have a secure connection.     I need to obtain your verbal consent now.   Are you willing to proceed with your visit today? YES   Anniah Glick has provided verbal consent on 07/18/2022 for a virtual visit (video or telephone).   Kristina Hassell Done, FNP   Date: 07/18/2022 7:31 AM   Virtual Visit via Video Note   I, Kristina Stewart, connected with Kristina Stewart ([id number], Oct 09, 1985) on 07/18/22 at  7:45 AM EST by a video-enabled telemedicine application and verified that I am speaking with the correct person using two identifiers.  Location: Patient: Virtual Visit Location Patient: Home Provider: Virtual Visit Location Provider: Mobile   I discussed the  limitations of evaluation and management by telemedicine and the availability of in person appointments. The patient expressed understanding and agreed to proceed.    History of Present Illness: Kristina Stewart is a 37 y.o. who identifies as a female who was assigned female at birth, and is being seen today for abdominal pain.  HPI: Abdominal Pain This is a new problem. The onset quality is gradual. The problem occurs intermittently. The pain is located in the epigastric region. The pain is at a severity of 3/10. The pain is mild. The quality of the pain is burning. The abdominal pain radiates to the epigastric region. Associated symptoms include nausea. Pertinent negatives include no constipation, diarrhea or vomiting. Associated symptoms comments: Normal stool color. The pain is aggravated by eating (drinking coffee). The pain is relieved by Recumbency. Treatments tried: prilosec. The treatment provided moderate relief.    Review of Systems  Gastrointestinal:  Positive for abdominal pain and nausea. Negative for constipation, diarrhea and vomiting.    Problems:  Patient Active Problem List   Diagnosis Date Noted   Intermittent abdominal pain 01/28/2022   Epigastric abdominal tenderness without rebound tenderness 01/07/2022   Fragile X syndrome 12/18/2021   Irregular periods 12/18/2021   Fatigue 12/18/2021   Acute cystitis with hematuria 12/18/2021   Overuse syndrome of hand 03/24/2021   Encounter for general adult medical examination with abnormal findings 03/24/2021   Iron deficiency anemia due to  chronic blood loss 03/24/2021   Moderate persistent asthma without complication 22/07/5425   Normal labor and delivery 07/09/2020   Pregnancy 11/09/2019   Seasonal and perennial allergic rhinoconjunctivitis 09/11/2019   Pollen-food allergy 06/12/2019   Dyshidrotic eczema 06/12/2019   Family history of fragile X syndrome 03/25/2016   History of frequent upper respiratory infection  04/26/2012   MTHFR mutation 04/01/2012   Homozygous MTHFR mutation [id number] 01/18/2012    Allergies:  Allergies  Allergen Reactions   Latex Rash   Trimethoprim    Almond (Diagnostic) Itching   Other Itching    PT IS ALLERGIC PITTED FRUITS   Medications:  Current Outpatient Medications:    albuterol (PROVENTIL) (2.5 MG/3ML) 0.083% nebulizer solution, Take 3 mLs (2.5 mg total) by nebulization every 6 (six) hours as needed for wheezing or shortness of breath (coughing fits)., Disp: 75 mL, Rfl: 2   albuterol (VENTOLIN HFA) 108 (90 Base) MCG/ACT inhaler, Inhale 2 puffs into the lungs every 4 (four) hours as needed for wheezing or shortness of breath., Disp: 6.7 g, Rfl: 1   Azelastine-Fluticasone 137-50 MCG/ACT SUSP, Place 1 spray into the nose in the morning and at bedtime., Disp: 23 g, Rfl: 0   budesonide (PULMICORT) 0.5 MG/2ML nebulizer solution, Take 2 mLs (0.5 mg total) by nebulization in the morning and at bedtime for 1-2 weeks during upper respiratory infections., Disp: 120 mL, Rfl: 2   Crisaborole (EUCRISA) 2 % OINT, Apply 1 application topically 2 (two) times daily as needed. (Patient not taking: Reported on 03/18/2022), Disp: 100 g, Rfl: 5   etonogestrel-ethinyl estradiol (NUVARING) 0.12-0.015 MG/24HR vaginal ring, Insert 1 ring vaginally every month, Disp: 3 each, Rfl: 3   fexofenadine (ALLEGRA) 180 MG tablet, Take 180 mg by mouth daily., Disp: , Rfl:    Fluticasone-Umeclidin-Vilant (TRELEGY ELLIPTA) 200-62.5-25 MCG/ACT AEPB, Inhale 1 puff into the lungs daily. Rinse mouth after each use, Disp: 60 each, Rfl: 2   ipratropium (ATROVENT) 0.03 % nasal spray, Place 1-2 sprays into both nostrils 2 (two) times daily as needed (nasal drainage)., Disp: 30 mL, Rfl: 5   ipratropium-albuterol (DUONEB) 0.5-2.5 (3) MG/3ML SOLN, Take 3 mLs by nebulization every 4 (four) hours as needed (coughing fits, shortness of breath, wheezing)., Disp: 180 mL, Rfl: 2   levocetirizine (XYZAL) 5 MG tablet, Take 5 mg by  mouth every evening., Disp: , Rfl:    loperamide (IMODIUM) 2 MG capsule, Take 1 capsule (2 mg total) by mouth every 6 (six) hours. May start with 2 capsules initially, Disp: 30 capsule, Rfl: 0   montelukast (SINGULAIR) 10 MG tablet, Take 1 tablet (10 mg total) by mouth at bedtime., Disp: 30 tablet, Rfl: 5   Multiple Vitamin (MULTIVITAMIN PO), Take by mouth., Disp: , Rfl:    olopatadine (PATADAY) 0.1 % ophthalmic solution, 1 drop 2 (two) times daily., Disp: , Rfl:    sertraline (ZOLOFT) 25 MG tablet, Take 1 tablet (25 mg total) by mouth daily., Disp: 90 tablet, Rfl: 3   triamcinolone ointment (KENALOG) 0.1 %, Apply 1 application topically 2 (two) times daily., Disp: 30 g, Rfl: 2  Observations/Objective: Patient is well-developed, well-nourished in no acute distress.  Resting comfortably  at home.  Head is normocephalic, atraumatic.  No labored breathing.  Speech is clear and coherent with logical content.  Patient is alert and oriented at baseline.  Pain in epigastric region  Assessment and Plan:  Kristina Stewart in today with chief complaint of Abdominal Pain   1. Acute gastric ulcer without hemorrhage  or perforation First 24 Hours-Clear liquids  popsicles  Jello  gatorade  Sprite Second 24 hours-Add Full liquids ( Liquids you cant see through) Third 24 hours- Bland diet ( foods that are baked or broiled)  *avoiding fried foods and highly spiced foods* During these 3 days  Avoid milk, cheese, ice cream or any other dairy products  Avoid caffeine- REMEMBER Mt. Dew and Mello Yellow contain lots of caffeine You should eat and drink in  Frequent small volumes If no improvement in symptoms or worsen in 2-3 days should RETRUN TO OFFICE or go to ER!       The above assessment and management plan was discussed with the patient. The patient verbalized understanding of and has agreed to the management plan. Patient is aware to call the clinic if symptoms persist or worsen. Patient  is aware when to return to the clinic for a follow-up visit. Patient educated on when it is appropriate to go to the emergency department.   Kristina Daphine Deutscher, FNP    Follow Up Instructions: I discussed the assessment and treatment plan with the patient. The patient was provided an opportunity to ask questions and all were answered. The patient agreed with the plan and demonstrated an understanding of the instructions.  A copy of instructions were sent to the patient via MyChart.  The patient was advised to call back or seek an in-person evaluation if the symptoms worsen or if the condition fails to improve as anticipated.  Time:  I spent 11 minutes with the patient via telehealth technology discussing the above problems/concerns.    Kristina Daphine Deutscher, FNP

## 2022-07-18 NOTE — Patient Instructions (Signed)
Kristina Stewart, thank you for joining Chevis Pretty, FNP for today's virtual visit.  While this provider is not your primary care provider (PCP), if your PCP is located in our provider database this encounter information will be shared with them immediately following your visit.   [facility] account gives you access to today's visit and all your visits, tests, and labs performed at [facility] " click here if you don't have a [facility] account or go to [email or web address]  Consent: (Patient) Kristina Stewart provided verbal consent for this virtual visit at the beginning of the encounter.  Current Medications:  Current Outpatient Medications:    lansoprazole (PREVACID) 30 MG capsule, Take 1 capsule (30 mg total) by mouth daily at 12 noon., Disp: 30 capsule, Rfl: 0   albuterol (PROVENTIL) (2.5 MG/3ML) 0.083% nebulizer solution, Take 3 mLs (2.5 mg total) by nebulization every 6 (six) hours as needed for wheezing or shortness of breath (coughing fits)., Disp: 75 mL, Rfl: 2   albuterol (VENTOLIN HFA) 108 (90 Base) MCG/ACT inhaler, Inhale 2 puffs into the lungs every 4 (four) hours as needed for wheezing or shortness of breath., Disp: 6.7 g, Rfl: 1   Azelastine-Fluticasone 137-50 MCG/ACT SUSP, Place 1 spray into the nose in the morning and at bedtime., Disp: 23 g, Rfl: 0   budesonide (PULMICORT) 0.5 MG/2ML nebulizer solution, Take 2 mLs (0.5 mg total) by nebulization in the morning and at bedtime for 1-2 weeks during upper respiratory infections., Disp: 120 mL, Rfl: 2   Crisaborole (EUCRISA) 2 % OINT, Apply 1 application topically 2 (two) times daily as needed. (Patient not taking: Reported on 03/18/2022), Disp: 100 g, Rfl: 5   etonogestrel-ethinyl estradiol (NUVARING) 0.12-0.015 MG/24HR vaginal ring, Insert 1 ring vaginally every month, Disp: 3 each, Rfl: 3   fexofenadine (ALLEGRA) 180 MG tablet, Take 180 mg by mouth daily., Disp: , Rfl:     Fluticasone-Umeclidin-Vilant (TRELEGY ELLIPTA) 200-62.5-25 MCG/ACT AEPB, Inhale 1 puff into the lungs daily. Rinse mouth after each use, Disp: 60 each, Rfl: 2   ipratropium (ATROVENT) 0.03 % nasal spray, Place 1-2 sprays into both nostrils 2 (two) times daily as needed (nasal drainage)., Disp: 30 mL, Rfl: 5   ipratropium-albuterol (DUONEB) 0.5-2.5 (3) MG/3ML SOLN, Take 3 mLs by nebulization every 4 (four) hours as needed (coughing fits, shortness of breath, wheezing)., Disp: 180 mL, Rfl: 2   levocetirizine (XYZAL) 5 MG tablet, Take 5 mg by mouth every evening., Disp: , Rfl:    loperamide (IMODIUM) 2 MG capsule, Take 1 capsule (2 mg total) by mouth every 6 (six) hours. May start with 2 capsules initially, Disp: 30 capsule, Rfl: 0   montelukast (SINGULAIR) 10 MG tablet, Take 1 tablet (10 mg total) by mouth at bedtime., Disp: 30 tablet, Rfl: 5   Multiple Vitamin (MULTIVITAMIN PO), Take by mouth., Disp: , Rfl:    olopatadine (PATADAY) 0.1 % ophthalmic solution, 1 drop 2 (two) times daily., Disp: , Rfl:    sertraline (ZOLOFT) 25 MG tablet, Take 1 tablet (25 mg total) by mouth daily., Disp: 90 tablet, Rfl: 3   triamcinolone ointment (KENALOG) 0.1 %, Apply 1 application topically 2 (two) times daily., Disp: 30 g, Rfl: 2   Medications ordered in this encounter:  Meds ordered this encounter  Medications   lansoprazole (PREVACID) 30 MG capsule    Sig: Take 1 capsule (30 mg total) by mouth daily at 12 noon.    Dispense:  30 capsule    Refill:  0    Order Specific Question:   Supervising Provider    Answer:   Chase Picket [[id number]]     *If you need refills on other medications prior to your next appointment, please contact your pharmacy*  Follow-Up: Call back or seek an in-person evaluation if the symptoms worsen or if the condition fails to improve as anticipated.  [place] [phone]  Other Instructions Avoid all spicy and fatty foods 24-48  hours   If you have been  instructed to have an in-person evaluation today at a local Urgent Care facility, please use the link below. It will take you to a list of all of our available [facility], including address, phone number and hours of operation. Please do not delay care.  [facility]  If you or a family member do not have a primary care provider, use the link below to schedule a visit and establish care. When you choose a [facility] primary care physician or advanced practice provider, you gain a long-term partner in health. Find a Primary Care Provider  Learn more about [facility] in-office and virtual care options: [facility]

## 2022-07-20 ENCOUNTER — Other Ambulatory Visit (HOSPITAL_COMMUNITY): Payer: Self-pay

## 2022-07-23 ENCOUNTER — Ambulatory Visit (INDEPENDENT_AMBULATORY_CARE_PROVIDER_SITE_OTHER): Payer: 59

## 2022-07-23 DIAGNOSIS — J309 Allergic rhinitis, unspecified: Secondary | ICD-10-CM

## 2022-07-30 ENCOUNTER — Ambulatory Visit (INDEPENDENT_AMBULATORY_CARE_PROVIDER_SITE_OTHER): Payer: 59

## 2022-07-30 DIAGNOSIS — J309 Allergic rhinitis, unspecified: Secondary | ICD-10-CM

## 2022-08-13 ENCOUNTER — Ambulatory Visit (INDEPENDENT_AMBULATORY_CARE_PROVIDER_SITE_OTHER): Payer: 59

## 2022-08-13 DIAGNOSIS — J309 Allergic rhinitis, unspecified: Secondary | ICD-10-CM | POA: Diagnosis not present

## 2022-08-20 ENCOUNTER — Ambulatory Visit (INDEPENDENT_AMBULATORY_CARE_PROVIDER_SITE_OTHER): Payer: 59

## 2022-08-20 DIAGNOSIS — J309 Allergic rhinitis, unspecified: Secondary | ICD-10-CM

## 2022-08-27 ENCOUNTER — Ambulatory Visit (INDEPENDENT_AMBULATORY_CARE_PROVIDER_SITE_OTHER): Payer: 59

## 2022-08-27 DIAGNOSIS — J309 Allergic rhinitis, unspecified: Secondary | ICD-10-CM | POA: Diagnosis not present

## 2022-09-02 ENCOUNTER — Other Ambulatory Visit: Payer: Self-pay | Admitting: Nurse Practitioner

## 2022-09-02 ENCOUNTER — Other Ambulatory Visit: Payer: Self-pay | Admitting: Allergy

## 2022-09-02 ENCOUNTER — Other Ambulatory Visit: Payer: Self-pay

## 2022-09-03 ENCOUNTER — Other Ambulatory Visit (HOSPITAL_COMMUNITY): Payer: Self-pay

## 2022-09-03 ENCOUNTER — Ambulatory Visit (INDEPENDENT_AMBULATORY_CARE_PROVIDER_SITE_OTHER): Payer: 59

## 2022-09-03 DIAGNOSIS — J309 Allergic rhinitis, unspecified: Secondary | ICD-10-CM | POA: Diagnosis not present

## 2022-09-07 ENCOUNTER — Other Ambulatory Visit (HOSPITAL_COMMUNITY): Payer: Self-pay

## 2022-09-07 ENCOUNTER — Encounter: Payer: Self-pay | Admitting: Family Medicine

## 2022-09-07 DIAGNOSIS — R002 Palpitations: Secondary | ICD-10-CM

## 2022-09-07 NOTE — Telephone Encounter (Signed)
Pt requesting cardiology referral for hx of PACs and palpitations from time to time. Office is needing referral before she can schedule, ok to place?

## 2022-09-08 NOTE — Addendum Note (Signed)
Addended by: Rossie Muskrat on: 09/08/2022 12:12 PM   Modules accepted: Orders

## 2022-09-10 ENCOUNTER — Ambulatory Visit (INDEPENDENT_AMBULATORY_CARE_PROVIDER_SITE_OTHER): Payer: 59

## 2022-09-10 DIAGNOSIS — J309 Allergic rhinitis, unspecified: Secondary | ICD-10-CM | POA: Diagnosis not present

## 2022-09-17 ENCOUNTER — Ambulatory Visit (INDEPENDENT_AMBULATORY_CARE_PROVIDER_SITE_OTHER): Payer: 59

## 2022-09-17 DIAGNOSIS — J309 Allergic rhinitis, unspecified: Secondary | ICD-10-CM

## 2022-09-24 ENCOUNTER — Other Ambulatory Visit (HOSPITAL_COMMUNITY): Payer: Self-pay

## 2022-09-24 ENCOUNTER — Ambulatory Visit (INDEPENDENT_AMBULATORY_CARE_PROVIDER_SITE_OTHER): Payer: 59

## 2022-09-24 ENCOUNTER — Other Ambulatory Visit: Payer: Self-pay

## 2022-09-24 ENCOUNTER — Other Ambulatory Visit: Payer: Self-pay | Admitting: Allergy

## 2022-09-24 DIAGNOSIS — J309 Allergic rhinitis, unspecified: Secondary | ICD-10-CM

## 2022-09-24 MED ORDER — AZELASTINE-FLUTICASONE 137-50 MCG/ACT NA SUSP
1.0000 | Freq: Two times a day (BID) | NASAL | 0 refills | Status: DC
  Filled 2022-09-24 – 2022-10-13 (×2): qty 23, 30d supply, fill #0

## 2022-09-25 ENCOUNTER — Other Ambulatory Visit (HOSPITAL_COMMUNITY): Payer: Self-pay

## 2022-10-01 ENCOUNTER — Ambulatory Visit (INDEPENDENT_AMBULATORY_CARE_PROVIDER_SITE_OTHER): Payer: 59

## 2022-10-01 DIAGNOSIS — J309 Allergic rhinitis, unspecified: Secondary | ICD-10-CM

## 2022-10-02 ENCOUNTER — Other Ambulatory Visit (HOSPITAL_COMMUNITY): Payer: Self-pay

## 2022-10-07 ENCOUNTER — Other Ambulatory Visit: Payer: Self-pay | Admitting: Allergy

## 2022-10-07 ENCOUNTER — Other Ambulatory Visit (HOSPITAL_COMMUNITY): Payer: Self-pay

## 2022-10-08 ENCOUNTER — Ambulatory Visit (INDEPENDENT_AMBULATORY_CARE_PROVIDER_SITE_OTHER): Payer: 59

## 2022-10-08 ENCOUNTER — Other Ambulatory Visit (HOSPITAL_COMMUNITY): Payer: Self-pay

## 2022-10-08 ENCOUNTER — Telehealth: Payer: Self-pay

## 2022-10-08 DIAGNOSIS — J309 Allergic rhinitis, unspecified: Secondary | ICD-10-CM | POA: Diagnosis not present

## 2022-10-08 NOTE — Telephone Encounter (Signed)
Patient Advocate Encounter   Received notification from [employer] that prior authorization is required for Azelastine-Fluticasone 137-50MCG/ACT suspension   Submitted: 10-08-2022 Key [id number]  Status is pending

## 2022-10-12 ENCOUNTER — Telehealth: Payer: Self-pay | Admitting: *Deleted

## 2022-10-12 NOTE — Telephone Encounter (Signed)
Patients vials are due to expire 10/28/2022, called and received verbal consent to order her Green and Red vials and charge her insurance.

## 2022-10-13 ENCOUNTER — Other Ambulatory Visit: Payer: Self-pay | Admitting: Allergy

## 2022-10-13 ENCOUNTER — Other Ambulatory Visit (HOSPITAL_COMMUNITY): Payer: Self-pay

## 2022-10-13 ENCOUNTER — Telehealth: Payer: Self-pay | Admitting: Allergy

## 2022-10-13 MED ORDER — TRELEGY ELLIPTA 200-62.5-25 MCG/ACT IN AEPB
1.0000 | INHALATION_SPRAY | Freq: Every day | RESPIRATORY_TRACT | 0 refills | Status: DC
  Filled 2022-10-13: qty 60, 30d supply, fill #0

## 2022-10-13 NOTE — Progress Notes (Signed)
VIALS EXP 10-13-23 

## 2022-10-13 NOTE — Telephone Encounter (Signed)
PA has been DENIED, denial letter has been attached in patients documents. 

## 2022-10-13 NOTE — Telephone Encounter (Signed)
I sent in a courtesy refill on the Trelegy into the [facility].  [place] [phone]

## 2022-10-13 NOTE — Addendum Note (Signed)
Addended by: Florence Canner on: 10/13/2022 03:01 PM   Modules accepted: Orders

## 2022-10-13 NOTE — Telephone Encounter (Signed)
Left a message for Krystalle to call back with which inhaler she needs a refill on.  [place] [phone]

## 2022-10-13 NOTE — Progress Notes (Signed)
Cardiology Office Note:    Date:  10/14/2022   ID:  Kristina Stewart, DOB 22-Dec-1985, MRN [id number]  PCP:  Avanell Shackleton, NP-C  Cardiologist:  Jodelle Red, MD  Referring MD: Avanell Shackleton, NP-C   CC: new patient evaluation for PACs/palpitations  History of Present Illness:    Kristina Stewart is a 37 y.o. female with a hx of PACs and palpitations. who is seen as a new consult at the request of Suezanne Jacquet, Vickie L, NP-C for the evaluation and management of PACs and palpitations.  Tachycardia/palpitations: -Initial onset: when she was a teenager  -Frequency/Duration: couple of times a year -Prior cardiac history: SVTs, PACs -Exercise level: Stays active and goes on walks. Not regularly exercising.  -Labs: TSH, kidney function/electrolytes, CBC reviewed. -Cardiac ROS: no chest pain, no shortness of breath, no PND, no orthopnea, no LE edema. -Family history: Mother has bradycardia, maternal uncle diagnosed with ARVD. Sister has fragile-x syndrome, father has diabetes, first degree relative has CAD  Today, she is overall well. She reports a history of intermittent palpitations, SVTs, and PACs. She states she has a couple episodes a year. She reports a blood pressure as high as 177 systolic during an episode. Her symptoms tend to resolve with cold water or coughing.   Her uncle was recently hospitalized with SVTs and 220s systolic blood pressure. He was diagnosed with ARVD.  She endorses whooshing attributed to her borderline anemia. She states she has a plant based diet, and at times has to take an iron pill.   She denies any chest pain, shortness of breath, or peripheral edema. No headaches, syncope, orthopnea, or PND.  Past Medical History:  Diagnosis Date   Allergy    Asthma    Family history of adverse reaction to anesthesia    sister problems with propofol   IBS (irritable bowel syndrome)    Obesity    Seasonal allergic rhinitis    Strep pharyngitis    TMJ  (temporomandibular joint syndrome)    Vertigo     Past Surgical History:  Procedure Laterality Date   BRAIN SURGERY     craniotomy/arnold chiari malformation; craniotomy & laminectomy   BRAIN SURGERY     craniotomy/arnold chiari malformation    Current Medications: Current Outpatient Medications on File Prior to Visit  Medication Sig   albuterol (PROVENTIL) (2.5 MG/3ML) 0.083% nebulizer solution Take 3 mLs (2.5 mg total) by nebulization every 6 (six) hours as needed for wheezing or shortness of breath (coughing fits).   albuterol (VENTOLIN HFA) 108 (90 Base) MCG/ACT inhaler Inhale 2 puffs into the lungs every 4 (four) hours as needed for wheezing or shortness of breath.   Azelastine-Fluticasone 137-50 MCG/ACT SUSP Place 1 spray into the nose in the morning and at bedtime.   budesonide (PULMICORT) 0.5 MG/2ML nebulizer solution Take 2 mLs (0.5 mg total) by nebulization in the morning and at bedtime for 1-2 weeks during upper respiratory infections.   etonogestrel-ethinyl estradiol (NUVARING) 0.12-0.015 MG/24HR vaginal ring Insert 1 ring vaginally every month   fexofenadine (ALLEGRA) 180 MG tablet Take 180 mg by mouth daily.   Fluticasone-Umeclidin-Vilant (TRELEGY ELLIPTA) 200-62.5-25 MCG/ACT AEPB Inhale 1 puff into the lungs daily. Rinse mouth after each use   ipratropium (ATROVENT) 0.03 % nasal spray Place 1-2 sprays into both nostrils 2 (two) times daily as needed (nasal drainage).   ipratropium-albuterol (DUONEB) 0.5-2.5 (3) MG/3ML SOLN Take 3 mLs by nebulization every 4 (four) hours as needed (coughing fits, shortness of breath, wheezing).  lansoprazole (PREVACID) 30 MG capsule Take 1 capsule (30 mg total) by mouth daily at 12 noon.   levocetirizine (XYZAL) 5 MG tablet Take 5 mg by mouth every evening.   loperamide (IMODIUM) 2 MG capsule Take 1 capsule (2 mg total) by mouth every 6 (six) hours. May start with 2 capsules initially   montelukast (SINGULAIR) 10 MG tablet Take 1 tablet (10  mg total) by mouth at bedtime.   Multiple Vitamin (MULTIVITAMIN PO) Take by mouth.   olopatadine (PATADAY) 0.1 % ophthalmic solution 1 drop 2 (two) times daily.   sertraline (ZOLOFT) 25 MG tablet Take 1 tablet (25 mg total) by mouth daily.   triamcinolone ointment (KENALOG) 0.1 % Apply 1 application topically 2 (two) times daily.   No current facility-administered medications on file prior to visit.     Allergies:   Latex, Trimethoprim, Almond (diagnostic), and Other   Social History   Tobacco Use   Smoking status: Never   Smokeless tobacco: Never  Vaping Use   Vaping Use: Never used  Substance Use Topics   Alcohol use: Yes    Comment: 1 glass per month   Drug use: No    Family History: family history includes Alzheimer's disease in her paternal grandfather; CAD in her maternal uncle; Cancer in her maternal grandfather and maternal grandmother; Diabetes in her paternal grandmother; Fibromyalgia in her paternal grandmother; Fragile X syndrome in her sister; GER disease in her sister; Hyperlipidemia in her father and mother; Hypertension in her father, maternal grandmother, and mother; Irritable bowel syndrome in her father; Neuropathy in her mother; Thyroid disease in her maternal grandmother and mother.  ROS:   Please see the history of present illness.  Additional pertinent ROS: Constitutional: Negative for chills, fever, night sweats, unintentional weight loss  HENT: Negative for ear pain and hearing loss.   Eyes: Negative for loss of vision and eye pain.  Respiratory: Negative for cough, sputum, wheezing.   Cardiovascular: See HPI. Gastrointestinal: Negative for abdominal pain, melena, and hematochezia.  Genitourinary: Negative for dysuria and hematuria.  Musculoskeletal: Negative for falls and myalgias.  Skin: Negative for itching and rash.  Neurological: Negative for focal weakness, focal sensory changes and loss of consciousness.  Endo/Heme/Allergies: Does not  bruise/bleed easily.     EKGs/Labs/Other Studies Reviewed:    The following studies were reviewed today: No prior cardiac studies  EKG:  EKG is personally reviewed.   10/14/2022: Sinus rhythm at 76 bpm  Recent Labs: 12/18/2021: BUN 7; Creatinine, Ser 0.75; Potassium 3.7; Sodium 136; TSH 1.01 01/07/2022: ALT 32 07/10/2022: Hemoglobin 13.3; Platelets 301  Recent Lipid Panel    Component Value Date/Time   CHOL 186 04/21/2019 0916   TRIG 132 04/21/2019 0916   HDL 52 04/21/2019 0916   CHOLHDL 3.6 04/21/2019 0916   VLDL 21 01/01/2015 0912   LDLCALC 109 (H) 04/21/2019 0916    Physical Exam:    VS:  BP 112/82 (BP Location: Right Arm, Patient Position: Sitting, Cuff Size: Large)   Pulse 76   Ht 5\' 5"  (1.651 m)   Wt 242 lb 3.2 oz (109.9 kg)   BMI 40.30 kg/m     Wt Readings from Last 3 Encounters:  10/14/22 242 lb 3.2 oz (109.9 kg)  01/28/22 233 lb (105.7 kg)  01/07/22 231 lb (104.8 kg)    GEN: Well nourished, well developed in no acute distress HEENT: Normal, moist mucous membranes NECK: No JVD CARDIAC: regular rhythm, normal S1 and S2, no rubs or gallops.  No murmur. VASCULAR: Radial and DP pulses 2+ bilaterally. No carotid bruits RESPIRATORY:  Clear to auscultation without rales, wheezing or rhonchi  ABDOMEN: Soft, non-tender, non-distended MUSCULOSKELETAL:  Ambulates independently SKIN: Warm and dry, no edema NEUROLOGIC:  Alert and oriented x 3. No focal neuro deficits noted. PSYCHIATRIC:  Normal affect    ASSESSMENT:    1. Palpitations   2. PAC (premature atrial contraction)   3. Family history of heart disease   4. Cardiac risk counseling    PLAN:    PACs Palpitations -no high risk features at this time -has maternal uncle with ARVC -reviewed red flag warning signs that need immediate medical attention -she will contact me for worsening symptoms  Family history of heart disease:  -maternal uncle with ARVC -sister has Fragile X  Cardiac risk counseling and  prevention recommendations: -recommend heart healthy/Mediterranean diet, with whole grains, fruits, vegetable, fish, lean meats, nuts, and olive oil. Limit salt. -recommend moderate walking, 3-5 times/week for 30-50 minutes each session. Aim for at least 150 minutes.week. Goal should be pace of 3 miles/hours, or walking 1.5 miles in 30 minutes -recommend avoidance of tobacco products. Avoid excess alcohol. -ASCVD risk score: The ASCVD Risk score (Arnett DK, et al., 2019) failed to calculate for the following reasons:   The 2019 ASCVD risk score is only valid for ages 61 to 15    Plan for follow up: 1 year, or sooner depending on possible genetic testing.   Jodelle Red, MD, PhD, [facility]  [facility]  [place]  [facility] at [facility] [address] [place], [place] [address] [phone]   Medication Adjustments/Labs and Tests Ordered: Current medicines are reviewed at length with the patient today.  Concerns regarding medicines are outlined above.  Orders Placed This Encounter  Procedures   EKG 12-Lead   No orders of the defined types were placed in this encounter.  Patient Instructions  Medication Instructions:  Your physician recommends that you continue on your current medications as directed. Please refer to the Current Medication list given to you today.  *If you need a refill on your cardiac medications before your next appointment, please call your pharmacy*  Lab Work: NONE  Testing/Procedures: NONE  Follow-Up: At [facility], you and your health needs are our priority.  As part of our continuing mission to provide you with exceptional heart care, we have created designated Provider Care Teams.  These Care Teams include your primary Cardiologist (physician) and Advanced Practice Providers (APPs -  Physician Assistants and Nurse Practitioners) who all work together to provide you  with the care you need, when you need it.  We recommend signing up for the patient portal called "MyChart".  Sign up information is provided on this After Visit Summary.  MyChart is used to connect with patients for Virtual Visits (Telemedicine).  Patients are able to view lab/test results, encounter notes, upcoming appointments, etc.  Non-urgent messages can be sent to your provider as well.   To learn more about what you can do with MyChart, go to [email or web address].    Your next appointment:   12 month(s)  The format for your next appointment:   In Person  Provider:   Jodelle Red, MD       I,Rachel Rivera,acting as a scribe for Jodelle Red, MD.,have documented all relevant documentation on the behalf of Jodelle Red, MD,as directed by  Jodelle Red, MD while in the presence of Alexus Galka  Cristal Deer, MD.  I, Jodelle Red, MD, have reviewed all documentation for this visit. The documentation on 12/09/22 for the exam, diagnosis, procedures, and orders are all accurate and complete.   Signed, Jodelle Red, MD PhD 10/14/2022     [facility]

## 2022-10-13 NOTE — Telephone Encounter (Signed)
Patient requesting refill for inhaler, appt scheduled on 4/30.

## 2022-10-14 ENCOUNTER — Encounter (HOSPITAL_BASED_OUTPATIENT_CLINIC_OR_DEPARTMENT_OTHER): Payer: Self-pay | Admitting: Cardiology

## 2022-10-14 ENCOUNTER — Ambulatory Visit (HOSPITAL_BASED_OUTPATIENT_CLINIC_OR_DEPARTMENT_OTHER): Payer: 59 | Admitting: Cardiology

## 2022-10-14 VITALS — BP 112/82 | HR 76 | Ht 65.0 in | Wt 242.2 lb

## 2022-10-14 DIAGNOSIS — R002 Palpitations: Secondary | ICD-10-CM | POA: Diagnosis not present

## 2022-10-14 DIAGNOSIS — J301 Allergic rhinitis due to pollen: Secondary | ICD-10-CM | POA: Diagnosis not present

## 2022-10-14 DIAGNOSIS — Z8249 Family history of ischemic heart disease and other diseases of the circulatory system: Secondary | ICD-10-CM | POA: Diagnosis not present

## 2022-10-14 DIAGNOSIS — I491 Atrial premature depolarization: Secondary | ICD-10-CM

## 2022-10-14 DIAGNOSIS — Z7189 Other specified counseling: Secondary | ICD-10-CM | POA: Diagnosis not present

## 2022-10-14 NOTE — Progress Notes (Signed)
Needed to dilute to green vials

## 2022-10-14 NOTE — Patient Instructions (Signed)
Medication Instructions:  Your physician recommends that you continue on your current medications as directed. Please refer to the Current Medication list given to you today.  *If you need a refill on your cardiac medications before your next appointment, please call your pharmacy*  Lab Work: NONE  Testing/Procedures: NONE  Follow-Up: At [facility], you and your health needs are our priority.  As part of our continuing mission to provide you with exceptional heart care, we have created designated Provider Care Teams.  These Care Teams include your primary Cardiologist (physician) and Advanced Practice Providers (APPs -  Physician Assistants and Nurse Practitioners) who all work together to provide you with the care you need, when you need it.  We recommend signing up for the patient portal called "MyChart".  Sign up information is provided on this After Visit Summary.  MyChart is used to connect with patients for Virtual Visits (Telemedicine).  Patients are able to view lab/test results, encounter notes, upcoming appointments, etc.  Non-urgent messages can be sent to your provider as well.   To learn more about what you can do with MyChart, go to [email or web address].    Your next appointment:   12 month(s)  The format for your next appointment:   In Person  Provider:   Bridgette Christopher, MD     

## 2022-10-15 ENCOUNTER — Ambulatory Visit (INDEPENDENT_AMBULATORY_CARE_PROVIDER_SITE_OTHER): Payer: 59

## 2022-10-15 ENCOUNTER — Other Ambulatory Visit (HOSPITAL_COMMUNITY): Payer: Self-pay

## 2022-10-15 DIAGNOSIS — J309 Allergic rhinitis, unspecified: Secondary | ICD-10-CM | POA: Diagnosis not present

## 2022-10-22 ENCOUNTER — Ambulatory Visit (INDEPENDENT_AMBULATORY_CARE_PROVIDER_SITE_OTHER): Payer: 59

## 2022-10-22 DIAGNOSIS — J309 Allergic rhinitis, unspecified: Secondary | ICD-10-CM

## 2022-10-26 NOTE — Patient Instructions (Incomplete)
Moderate persistent asthma without complication Past history - Diagnosed with asthma in middle school and was doing well up until a few years ago.  Main triggers include allergies, weather change, infection, strong scents and perfumes.  During these flares she takes Advair 250 1 puff twice a day, Singulair 10 mg daily and albuterol as needed with good benefit. 2020 spirometry was normal.  Daily controller medication(s):  Continue Trelegy 1 puff once a day and rinse mouth after each use.  During upper respiratory infections/flares:  Start Pulmicort (budesonide) 0.5mg  nebulizer twice a day for 1-2 weeks until your breathing symptoms return to baseline.  Pretreat with albuterol 2 puffs or albuterol nebulizer.  If you need to use your albuterol nebulizer machine back to back within 15-30 minutes with no relief then please go to the ER/urgent care for further evaluation.  May use albuterol rescue inhaler 2 puffs or nebulizer every 4 to 6 hours as needed for shortness of breath, chest tightness, coughing, and wheezing. May use albuterol rescue inhaler 2 puffs 5 to 15 minutes prior to strenuous physical activities. Monitor frequency of use.    Seasonal and perennial allergic rhinoconjunctivitis Past history - Perennial rhinoconjunctivitis symptoms for the last 10+ years with worsening during change of season. 2020 skin testing showed: Positive to grass, weed, ragweed, trees, mold. Started AIT on 07/06/2019 (MOLD, [id number]) and stopped due to pregnancy. Continue environmental control measures. Continue dymista (fluticasone + azelastine nasal spray combination) 1 spray per nostril twice a day. Use Atrovent (ipratropium) 0.03% 1-2 sprays per nostril twice a day as needed for runny nose/drainage. Nasal saline spray (i.e., Simply Saline) or nasal saline lavage (i.e., NeilMed) is recommended as needed and prior to medicated nasal sprays. Use over the counter antihistamines such as Zyrtec (cetirizine),  Claritin (loratadine), Allegra (fexofenadine), or Xyzal (levocetirizine) daily as needed. May take twice a day during allergy flares. May switch antihistamines every few months. Continue Singulair (montelukast) 10mg  daily at night. Continue allergy injections - okay to get as long as you didn't use your albuterol the day before or the day of injection.    History of frequent upper respiratory infection Past history - 4 sinus infections per year. Wants to know if she needs a pneumonia vaccine. Keep track of infections and antibiotics use. Get bloodwork to look at immune system.   Dyshidrotic eczema Past history - Dyshidrotic eczema on the palms bilaterally.  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   Use triamcinolone 0.1% ointment twice a day as needed for eczema flares. Do not use on the face, neck, armpits or groin area. Do not use more than 3 weeks in a row.  Use Eucrisa (crisaborole) 2% ointment twice a day on mild rash flares on the face and body. This is a non-steroid ointment.  Continue proper skin care.   Pollen-food allergy Past history - Certain raw fruits and almonds cause perioral pruritus.  Tolerates processed and cooked forms with no issues.  Patient questionably had a reaction to pistachios but it was cross contaminated almonds in the past.  She tolerates other tree nuts including cashews, walnuts, pecans and hazelnuts with no issues. 2020 skin testing was borderline positive to almonds and negative pistachios. Okay with pistachios and nectarines.  For mild symptoms you can take over the counter antihistamines such as Benadryl and monitor symptoms closely. If symptoms worsen or if you have severe symptoms including breathing issues, throat closure, significant swelling, whole body hives, severe diarrhea  and vomiting, lightheadedness then inject epinephrine and seek immediate medical care afterwards.  Schedule a follow up appointment in  months or sooner if needed

## 2022-10-27 ENCOUNTER — Ambulatory Visit: Payer: 59 | Admitting: Family

## 2022-10-27 ENCOUNTER — Other Ambulatory Visit (HOSPITAL_COMMUNITY): Payer: Self-pay

## 2022-10-27 ENCOUNTER — Other Ambulatory Visit: Payer: Self-pay

## 2022-10-27 ENCOUNTER — Encounter: Payer: Self-pay | Admitting: Family

## 2022-10-27 VITALS — BP 102/70 | HR 82 | Temp 98.3°F | Resp 16 | Wt 242.1 lb

## 2022-10-27 DIAGNOSIS — H1013 Acute atopic conjunctivitis, bilateral: Secondary | ICD-10-CM

## 2022-10-27 DIAGNOSIS — Z8709 Personal history of other diseases of the respiratory system: Secondary | ICD-10-CM

## 2022-10-27 DIAGNOSIS — H1010 Acute atopic conjunctivitis, unspecified eye: Secondary | ICD-10-CM

## 2022-10-27 DIAGNOSIS — L301 Dyshidrosis [pompholyx]: Secondary | ICD-10-CM

## 2022-10-27 DIAGNOSIS — T781XXD Other adverse food reactions, not elsewhere classified, subsequent encounter: Secondary | ICD-10-CM

## 2022-10-27 DIAGNOSIS — J4540 Moderate persistent asthma, uncomplicated: Secondary | ICD-10-CM

## 2022-10-27 DIAGNOSIS — J309 Allergic rhinitis, unspecified: Secondary | ICD-10-CM | POA: Diagnosis not present

## 2022-10-27 MED ORDER — TRELEGY ELLIPTA 200-62.5-25 MCG/ACT IN AEPB
1.0000 | INHALATION_SPRAY | Freq: Every day | RESPIRATORY_TRACT | 5 refills | Status: DC
  Filled 2022-10-27: qty 28, 14d supply, fill #0
  Filled 2022-11-18: qty 60, 30d supply, fill #0
  Filled 2022-12-21: qty 60, 30d supply, fill #1
  Filled 2023-01-17: qty 60, 30d supply, fill #2
  Filled 2023-03-31: qty 60, 30d supply, fill #3
  Filled 2023-07-06: qty 60, 30d supply, fill #4
  Filled 2023-08-17: qty 60, 30d supply, fill #5

## 2022-10-27 MED ORDER — EPINEPHRINE 0.3 MG/0.3ML IJ SOAJ
0.3000 mg | INTRAMUSCULAR | 1 refills | Status: AC | PRN
  Filled 2022-10-27: qty 2, 2d supply, fill #0
  Filled 2023-03-31: qty 2, 2d supply, fill #1

## 2022-10-27 MED ORDER — AZELASTINE-FLUTICASONE 137-50 MCG/ACT NA SUSP
1.0000 | Freq: Two times a day (BID) | NASAL | 1 refills | Status: DC
  Filled 2022-10-27: qty 23, 30d supply, fill #0
  Filled 2022-11-18: qty 69, 90d supply, fill #0

## 2022-10-27 MED ORDER — MONTELUKAST SODIUM 10 MG PO TABS
10.0000 mg | ORAL_TABLET | Freq: Every day | ORAL | 1 refills | Status: DC
  Filled 2022-10-27: qty 90, 90d supply, fill #0
  Filled 2023-02-10: qty 90, 90d supply, fill #1

## 2022-10-27 NOTE — Progress Notes (Signed)
[address] [place] [address] Dept: [phone]  FOLLOW UP NOTE  Patient ID: Kristina Stewart, female    DOB: 10/07/1985  Age: 37 y.o. MRN: [id number] Date of Office Visit: 10/27/2022  Assessment  Chief Complaint: Follow-up  HPI Kristina Stewart is a 37 year old female who presents today for follow-up of moderate persistent asthma without complication, seasonal and perennial allergic rhinoconjunctivitis, history of frequent upper respiratory infection, dyshidrotic eczema, and pollen food allergy.  She was last seen on March 18, 2022 by Dr. Selena Stewart.  She denies any new diagnosis or surgery since her last office visit.  Moderate persistent asthma: She continues to take Trelegy 200 mcg 1 puff once a day.  She also has Pulmicort 0.5 mg to use as needed during asthma flares.  She reports couple of weeks ago she had a little bit of tightness in her chest with the season change.  She uses DuoNeb and Pulmicort and this knocked it right out.  She denies coughing, wheezing, shortness of breath, and nocturnal awakenings due to breathing problems.  Since her last office visit she has not required any systemic steroids or made any trips to the emergency room or urgent care due to breathing problems.   The last time she has had to use rescue inhaler was couple weeks ago.  Seasonal and perennial allergic rhinoconjunctivitis: She has Dymista to use as needed, Zyrtec 10 mg in the morning, Xyzal 5 mg at night, montelukast 10 mg daily, and allergy injections per protocol.  She reports that her allergy injections have been better since adding epi wash.  She denies any reaction with her last allergy injection.  She has tried refilling Dymista and her insurance wanted proof of what medication she has tried.  In the past she has tried Flonase, ipratropium bromide nasal spray and Ryaltris.  Her insurance did not cover Ryaltris.  She has not tried azelastine nasal spray on his own.  She is hoping that Dymista  can be covered because this combination has worked best for her allergies.  A couple weeks ago her allergy symptoms were worse and she had itchy eyes, itchy ears, itchy face and postnasal drip. She has Pataday eye drops to help with itchy eyes,but sometimes they cause her eyes to be dry. She also has Systane eye drops to use. This was recommended by her eye doctor. She denies rhinorrhea and nasal congestion.  She has not been treated for any sinus infections since we last saw her.  History of recurrent upper respiratory infections: She has not had any upper respiratory infections since her last office visit.  Her pneumococcal titers were protective to 12 out of 23.  Her complement was normal, her tetanus and diphtheria were protective and her immunoglobulins were normal.  Dyshidrotic eczema is reported as doing pretty good.  Once in a while she will need to use Kenalog.  She also has [place] to use as needed.  Pollen food allergy: She reports that she is not avoiding raw fruits now.  This has gone away.  She had an apple on the way to our office today and has not had any problems.  She has been cautious about trying almonds, but she has had a little bit of almond milk and a little bit of almond butter without any problems.   A couple weeks ago when the pollen must have been high, she was eating  pistachios and this  caused her to be itchy around her mouth.  She has tried  this again and did not have any issues.  She reports that she has her EpiPen.   Drug Allergies:  Allergies  Allergen Reactions   Latex Rash   Trimethoprim    Almond (Diagnostic) Itching   Other Itching    PT IS ALLERGIC PITTED FRUITS    Review of Systems: Review of Systems  Constitutional:  Negative for chills and fever.  HENT:         Reports a little post nasal drip a couple weeks ago. Denies rhinorrhea and nasal congestion  Eyes:        Reports itchy eyes. Pataday can make eyes dry at times. Uses Systane also   Respiratory:  Negative for cough, shortness of breath and wheezing.        Reports tightness in chest  with the change of season a couple of weeks ago. Denies cough, wheeze, shortness of breath and nocturnal awakenings due to breathing problems  Cardiovascular:  Positive for palpitations. Negative for chest pain.       Reports palpitations- sees cardiology.   Gastrointestinal:  Positive for heartburn.       Reports heartburn approximately once a week depending on her diet. Thinks that she has previously had a stress ulcer  Skin:  Positive for itching.       Reports itchy skin on face and ears with pollen a couple of weeks ago  Neurological:  Negative for headaches.  Endo/Heme/Allergies:  Positive for environmental allergies.     Physical Exam: BP 102/70   Pulse 82   Temp 98.3 F (36.8 C) (Temporal)   Resp 16   Wt 242 lb 1.6 oz (109.8 kg)   SpO2 95%   BMI 40.29 kg/m    Physical Exam Constitutional:      Appearance: Normal appearance.  HENT:     Head: Normocephalic and atraumatic.     Comments: Pharynx normal, eyes normal, ears normal, nose: Bilateral lower turbinates mildly edematous with no drainage noted    Right Ear: Tympanic membrane, ear canal and external ear normal.     Left Ear: Tympanic membrane, ear canal and external ear normal.     Mouth/Throat:     Mouth: Mucous membranes are moist.     Pharynx: Oropharynx is clear.  Eyes:     Conjunctiva/sclera: Conjunctivae normal.  Cardiovascular:     Rate and Rhythm: Regular rhythm.     Heart sounds: Normal heart sounds.  Pulmonary:     Effort: Pulmonary effort is normal.     Breath sounds: Normal breath sounds.     Comments: Lungs clear to auscultation Musculoskeletal:     Cervical back: Neck supple.  Skin:    General: Skin is warm.  Neurological:     Mental Status: She is alert and oriented to person, place, and time.  Psychiatric:        Mood and Affect: Mood normal.        Behavior: Behavior normal.         Thought Content: Thought content normal.        Judgment: Judgment normal.     Diagnostics: FVC 4.21 L (104%), FEV1 3.82 L (114%).  Spirometry indicates normal spirometry.  Assessment and Plan: 1. Moderate persistent asthma without complication   2. Seasonal and perennial allergic rhinoconjunctivitis   3. Dyshidrotic eczema   4. History of frequent upper respiratory infection   5. Pollen-food allergy, subsequent encounter   6. Allergic rhinitis, unspecified seasonality, unspecified trigger     Meds  ordered this encounter  Medications   Fluticasone-Umeclidin-Vilant (TRELEGY ELLIPTA) 200-62.5-25 MCG/ACT AEPB    Sig: Inhale 1 puff into the lungs daily. Rinse mouth after each use    Dispense:  60 each    Refill:  5   montelukast (SINGULAIR) 10 MG tablet    Sig: Take 1 tablet (10 mg total) by mouth at bedtime.    Dispense:  90 tablet    Refill:  1   Azelastine-Fluticasone 137-50 MCG/ACT SUSP    Sig: Place 1 spray into the nose in the morning and at bedtime.    Dispense:  69 g    Refill:  1    Tried atrovent, flonase, ryaltris   EPINEPHrine 0.3 mg/0.3 mL IJ SOAJ injection    Sig: Inject 0.3 mg into the muscle as needed for anaphylaxis.    Dispense:  2 each    Refill:  1    Patient Instructions  Moderate persistent asthma without complication Past history - Diagnosed with asthma in middle school and was doing well up until a few years ago.  Main triggers include allergies, weather change, infection, strong scents and perfumes.  During these flares she takes Advair 250 1 puff twice a day, Singulair 10 mg daily and albuterol as needed with good benefit. 2020 spirometry was normal.  Daily controller medication(s):  Continue Trelegy 1 puff once a day and rinse mouth after each use.  During upper respiratory infections/flares:  Start Pulmicort (budesonide) 0.5mg  nebulizer twice a day for 1-2 weeks until your breathing symptoms return to baseline.  Pretreat with albuterol 2  puffs or albuterol nebulizer.  If you need to use your albuterol nebulizer machine back to back within 15-30 minutes with no relief then please go to the ER/urgent care for further evaluation.  May use albuterol rescue inhaler 2 puffs or nebulizer every 4 to 6 hours as needed for shortness of breath, chest tightness, coughing, and wheezing. May use albuterol rescue inhaler 2 puffs 5 to 15 minutes prior to strenuous physical activities. Monitor frequency of use.    Seasonal and perennial allergic rhinoconjunctivitis Past history - Perennial rhinoconjunctivitis symptoms for the last 10+ years with worsening during change of season. 2020 skin testing showed: Positive to grass, weed, ragweed, trees, mold. Started AIT on 07/06/2019 (MOLD, G-[id number]) and stopped due to pregnancy. Continue environmental control measures. Continue dymista (fluticasone + azelastine nasal spray combination) 1 spray per nostril twice a day. Use Atrovent (ipratropium) 0.03% 1-2 sprays per nostril twice a day as needed for runny nose/drainage. Nasal saline spray (i.e., Simply Saline) or nasal saline lavage (i.e., NeilMed) is recommended as needed and prior to medicated nasal sprays. Use over the counter antihistamines such as Zyrtec (cetirizine), Claritin (loratadine), Allegra (fexofenadine), or Xyzal (levocetirizine) daily as needed. May take twice a day during allergy flares. May switch antihistamines every few months. Continue Singulair (montelukast) 10mg  daily at night. Continue allergy injections - okay to get as long as you didn't use your albuterol the day before or the day of injection.    History of frequent upper respiratory infection Past history - 4 sinus infections per year. Wants to know if she needs a pneumonia vaccine. Interim history- no infections since last office visit Keep track of infections and antibiotics use. Get bloodwork to look at immune system.   Dyshidrotic eczema Past history - Dyshidrotic eczema  on the palms bilaterally.  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   Use triamcinolone 0.1%  ointment twice a day as needed for eczema flares. Do not use on the face, neck, armpits or groin area. Do not use more than 3 weeks in a row.  Use Eucrisa (crisaborole) 2% ointment twice a day on mild rash flares on the face and body. This is a non-steroid ointment.  Continue proper skin care.   Pollen-food allergy Past history - Certain raw fruits and almonds cause perioral pruritus.  Tolerates processed and cooked forms with no issues.  Patient questionably had a reaction to pistachios but it was cross contaminated almonds in the past.  She tolerates other tree nuts including cashews, walnuts, pecans and hazelnuts with no issues. 2020 skin testing was borderline positive to almonds and negative pistachios. Okay with pistachios and nectarines.  For mild symptoms you can take over the counter antihistamines such as Benadryl and monitor symptoms closely. If symptoms worsen or if you have severe symptoms including breathing issues, throat closure, significant swelling, whole body hives, severe diarrhea and vomiting, lightheadedness then inject epinephrine and seek immediate medical care afterwards.  Schedule a follow up appointment in 6 months or sooner if needed  Return in about 6 months (around 04/28/2023), or if symptoms worsen or fail to improve.    Thank you for the opportunity to care for this patient.  Please do not hesitate to contact me with questions.  Nehemiah Settle, FNP [facility]

## 2022-10-29 ENCOUNTER — Other Ambulatory Visit (HOSPITAL_COMMUNITY): Payer: Self-pay

## 2022-11-07 ENCOUNTER — Telehealth: Payer: 59 | Admitting: Nurse Practitioner

## 2022-11-07 DIAGNOSIS — J019 Acute sinusitis, unspecified: Secondary | ICD-10-CM | POA: Diagnosis not present

## 2022-11-07 DIAGNOSIS — B9689 Other specified bacterial agents as the cause of diseases classified elsewhere: Secondary | ICD-10-CM

## 2022-11-07 MED ORDER — AMOXICILLIN-POT CLAVULANATE 875-125 MG PO TABS: 1.0000 | ORAL_TABLET | Freq: Two times a day (BID) | ORAL | 0 refills | Status: AC

## 2022-11-07 NOTE — Progress Notes (Signed)

## 2022-11-07 NOTE — Progress Notes (Signed)
I have spent 5 minutes in review of e-visit questionnaire, review and updating patient chart, medical decision making and response to patient.  ° °Mattox Schorr W Sruthi Maurer, NP ° °  °

## 2022-11-10 ENCOUNTER — Ambulatory Visit (INDEPENDENT_AMBULATORY_CARE_PROVIDER_SITE_OTHER): Payer: 59

## 2022-11-10 DIAGNOSIS — J309 Allergic rhinitis, unspecified: Secondary | ICD-10-CM | POA: Diagnosis not present

## 2022-11-12 ENCOUNTER — Other Ambulatory Visit (HOSPITAL_COMMUNITY): Payer: Self-pay

## 2022-11-13 ENCOUNTER — Other Ambulatory Visit (HOSPITAL_COMMUNITY): Payer: Self-pay

## 2022-11-18 ENCOUNTER — Other Ambulatory Visit: Payer: Self-pay | Admitting: Allergy

## 2022-11-18 ENCOUNTER — Telehealth: Payer: Self-pay

## 2022-11-18 ENCOUNTER — Other Ambulatory Visit: Payer: Self-pay

## 2022-11-18 ENCOUNTER — Other Ambulatory Visit (HOSPITAL_COMMUNITY): Payer: Self-pay

## 2022-11-18 MED ORDER — IPRATROPIUM BROMIDE 0.03 % NA SOLN
1.0000 | Freq: Two times a day (BID) | NASAL | 5 refills | Status: AC | PRN
  Filled 2022-11-18: qty 30, 43d supply, fill #0
  Filled 2023-03-14: qty 30, 43d supply, fill #1

## 2022-11-18 NOTE — Telephone Encounter (Signed)
Patient called in - DOB/Pharmacy verified - stated a PA is needed for Azelastine-Fluticasone (Dymista)  137-50 mcg/act per [facility] [address].  Patient/Pharmacy Technician was advised to fax over PA request for our PA Team to process.  Both verbalized understanding, no further questions.

## 2022-11-19 ENCOUNTER — Ambulatory Visit (INDEPENDENT_AMBULATORY_CARE_PROVIDER_SITE_OTHER): Payer: 59

## 2022-11-19 DIAGNOSIS — J309 Allergic rhinitis, unspecified: Secondary | ICD-10-CM

## 2022-11-20 ENCOUNTER — Other Ambulatory Visit: Payer: Self-pay | Admitting: *Deleted

## 2022-11-20 ENCOUNTER — Other Ambulatory Visit (HOSPITAL_COMMUNITY): Payer: Self-pay

## 2022-11-20 ENCOUNTER — Telehealth: Payer: Self-pay

## 2022-11-20 ENCOUNTER — Other Ambulatory Visit: Payer: Self-pay

## 2022-11-20 MED ORDER — FLUTICASONE PROPIONATE 50 MCG/ACT NA SUSP
1.0000 | Freq: Every day | NASAL | 1 refills | Status: DC | PRN
  Filled 2022-11-20: qty 16, 30d supply, fill #0
  Filled 2023-01-17: qty 16, 30d supply, fill #1

## 2022-11-20 MED ORDER — AZELASTINE-FLUTICASONE 137-50 MCG/ACT NA SUSP
2.0000 | Freq: Every day | NASAL | 5 refills | Status: DC | PRN
  Filled 2022-11-20: qty 23, 30d supply, fill #0

## 2022-11-20 NOTE — Telephone Encounter (Signed)
Please let Kristina Stewart know that her insurance requires a trial of fluticasone and flunisolide within the past 120 days before they will approve azelastine-fluticasone nasal spray. Has she ever tried Ryaltris nasal spray? We could see if that is covered in its place. The prescription would be for Ryaltris 2 sprays in each nostril twice a day as needed for runny/stuffy nose. Let her know that this medication goes to a specialty pharmacy and not her local pharmacy.

## 2022-11-20 NOTE — Telephone Encounter (Signed)
Patient Advocate Encounter   Received notification from [employer] that prior authorization is required for Azelastine-Fluticasone 137-50MCG/ACT suspension   Submitted: n/a  Key [id number]  PA not submitted at this time. Awaiting response from office.

## 2022-11-20 NOTE — Telephone Encounter (Signed)
New prescription for fluticasone has been sent in. Called patient and informed of medication changed and advised that there needs to be documentation per insurance of trial and failure of fluticasone and flunisolide within 120 days. Patient verbalized understanding and will pick up medication and give an update within a week.

## 2022-11-20 NOTE — Telephone Encounter (Signed)
Please then let the patient know what her insurance prefers. We can then send in a prescription for fluticasone 1-2 sprays in each nostril once a day as needed for stuffy nose. She can continue Atrovent (ipratropium) 0.03% 1-2 sprays per nostril twice a day as needed for runny nose/drainage

## 2022-11-20 NOTE — Telephone Encounter (Signed)
So the Dymista is actually preferred, it just required step therapy of the fluticasone and flunisolide before the insurance will cover, for the Ryaltris it is unfortunately a product coverage exclusion and is not covered at all. How would you like to proceed?

## 2022-11-22 ENCOUNTER — Telehealth: Payer: 59 | Admitting: Nurse Practitioner

## 2022-11-22 DIAGNOSIS — B370 Candidal stomatitis: Secondary | ICD-10-CM

## 2022-11-22 MED ORDER — CLOTRIMAZOLE 10 MG MT TROC
10.0000 mg | Freq: Every day | OROMUCOSAL | 0 refills | Status: AC
Start: 2022-11-22 — End: 2022-12-06

## 2022-11-22 NOTE — Progress Notes (Signed)
I have spent 5 minutes in review of e-visit questionnaire, review and updating patient chart, medical decision making and response to patient.  ° °Harl Wiechmann W Safira Proffit, NP ° °  °

## 2022-11-22 NOTE — Progress Notes (Signed)
E-Visit for Mouth Ulcers  We are sorry that you are not feeling well.  Here is how we plan to help!  Based on what you have shared with me, I have prescribed clotrimazole lozenges.   Diflucan is not the standard of care unless the thrush is severe, widespread or you have failed nystatin, clotrimazole or miconazole tablets If you feel diflucan is warranted please follow up with your PCP regarding prescribing this.    GET HELP RIGHT AWAY IF: Persistent ulcers require checking IN PERSON (face to face). Any mouth lesion lasting longer than a month should be seen by your DENTIST as soon as possible for evaluation for possible oral cancer. If you have a non-painful ulcer in 1 or more areas of your mouth Ulcers that are spreading, are very large or particularly painful Ulcers last longer than one week without improving on treatment If you develop a fever, swollen glands and begin to feel unwell Ulcers that developed after starting a new medication MAKE SURE YOU: Understand these instructions. Will watch your condition. Will get help right away if you are not doing well or get worse.  Thank you for choosing an e-visit.  Your e-visit answers were reviewed by a board certified advanced clinical practitioner to complete your personal care plan. Depending upon the condition, your plan could have included both over the counter or prescription medications.  Please review your pharmacy choice. Make sure the pharmacy is open so you can pick up prescription now. If there is a problem, you may contact your provider through [employer] and have the prescription routed to another pharmacy.  Your safety is important to [place]. If you have drug allergies check your prescription carefully.   For the next 24 hours you can use MyChart to ask questions about today's visit, request a non-urgent call back, or ask for a work or school excuse. You will get an email in the next two days asking about your experience.  I hope that your e-visit has been valuable and will speed your recovery.

## 2022-12-09 ENCOUNTER — Encounter (HOSPITAL_BASED_OUTPATIENT_CLINIC_OR_DEPARTMENT_OTHER): Payer: Self-pay | Admitting: Cardiology

## 2022-12-10 ENCOUNTER — Ambulatory Visit (INDEPENDENT_AMBULATORY_CARE_PROVIDER_SITE_OTHER): Payer: 59

## 2022-12-10 DIAGNOSIS — J309 Allergic rhinitis, unspecified: Secondary | ICD-10-CM | POA: Diagnosis not present

## 2022-12-17 ENCOUNTER — Ambulatory Visit (INDEPENDENT_AMBULATORY_CARE_PROVIDER_SITE_OTHER): Payer: 59

## 2022-12-17 DIAGNOSIS — J309 Allergic rhinitis, unspecified: Secondary | ICD-10-CM | POA: Diagnosis not present

## 2022-12-21 ENCOUNTER — Other Ambulatory Visit (HOSPITAL_COMMUNITY): Payer: Self-pay

## 2022-12-21 MED ORDER — ETONOGESTREL-ETHINYL ESTRADIOL 0.12-0.015 MG/24HR VA RING
VAGINAL_RING | VAGINAL | 0 refills | Status: DC
  Filled 2022-12-21: qty 3, 84d supply, fill #0

## 2022-12-24 ENCOUNTER — Ambulatory Visit (INDEPENDENT_AMBULATORY_CARE_PROVIDER_SITE_OTHER): Payer: 59 | Admitting: Family Medicine

## 2022-12-24 ENCOUNTER — Ambulatory Visit (INDEPENDENT_AMBULATORY_CARE_PROVIDER_SITE_OTHER): Payer: 59

## 2022-12-24 ENCOUNTER — Encounter: Payer: Self-pay | Admitting: Family Medicine

## 2022-12-24 ENCOUNTER — Other Ambulatory Visit (HOSPITAL_COMMUNITY): Payer: Self-pay

## 2022-12-24 VITALS — BP 126/82 | HR 80 | Temp 97.6°F | Ht 65.0 in | Wt 242.0 lb

## 2022-12-24 DIAGNOSIS — B370 Candidal stomatitis: Secondary | ICD-10-CM | POA: Diagnosis not present

## 2022-12-24 DIAGNOSIS — J309 Allergic rhinitis, unspecified: Secondary | ICD-10-CM

## 2022-12-24 MED ORDER — FLUCONAZOLE 150 MG PO TABS
150.0000 mg | ORAL_TABLET | Freq: Once | ORAL | 0 refills | Status: AC
Start: 2022-12-24 — End: 2022-12-25
  Filled 2022-12-24: qty 1, 1d supply, fill #0

## 2022-12-24 NOTE — Progress Notes (Signed)
Subjective:     Patient ID: Kristina Stewart, female    DOB: 05-10-1986, 37 y.o.   MRN: [id number]  Chief Complaint  Patient presents with   Ginette Pitman    HPI  Discussed the use of AI scribe software for clinical note transcription with the patient, who gave verbal consent to proceed.  History of Present Illness           Complains of a 4-week history of oral thrush.  States she gets this every time she has taken antibiotic.  She completed an antibiotic for sinusitis.  She did an e-visit and was prescribed clotrimazole and is approximately 50% better.  States she has tried nystatin in the past which did not work.  She tries home remedies such as vinegar, yogurt, probiotics and kombucha.  She has asthma and always rinses her mouth after using her inhalers.  No history of diabetes or autoimmune conditions.   She has a Nuvaring     Health Maintenance Due  Topic Date Due   Hepatitis C Screening  Never done    Past Medical History:  Diagnosis Date   Allergy    Asthma    Family history of adverse reaction to anesthesia    sister problems with propofol   IBS (irritable bowel syndrome)    Obesity    Seasonal allergic rhinitis    Strep pharyngitis    TMJ (temporomandibular joint syndrome)    Vertigo     Past Surgical History:  Procedure Laterality Date   BRAIN SURGERY     craniotomy/arnold chiari malformation; craniotomy & laminectomy   BRAIN SURGERY     craniotomy/arnold chiari malformation    Family History  Problem Relation Age of Onset   Hypertension Mother    Thyroid disease Mother    Hyperlipidemia Mother    Neuropathy Mother    Hypertension Father    Irritable bowel syndrome Father    Hyperlipidemia Father    GER disease Sister    Fragile X syndrome Sister    Alzheimer's disease Paternal Grandfather    Diabetes Paternal Grandmother    Fibromyalgia Paternal Grandmother    Thyroid disease Maternal Grandmother        HYPOTHYROID   Hypertension  Maternal Grandmother    Cancer Maternal Grandmother        LUNG   Cancer Maternal Grandfather        LUNG   CAD Maternal Uncle     Social History   Socioeconomic History   Marital status: Married    Spouse name: Not on file   Number of children: 1   Years of education: Not on file   Highest education level: Master's degree (e.g., MA, MS, MEng, MEd, MSW, MBA)  Occupational History   Not on file  Tobacco Use   Smoking status: Never   Smokeless tobacco: Never  Vaping Use   Vaping Use: Never used  Substance and Sexual Activity   Alcohol use: Yes    Comment: 1 glass per month   Drug use: No   Sexual activity: Yes  Other Topics Concern   Not on file  Social History Narrative   Lives at home with husband & son   Right handed   Caffeine: none      ** Merged History Encounter **       Social Determinants of Health   Financial Resource Strain: Not on file  Food Insecurity: No Food Insecurity (10/14/2022)   Hunger Vital Sign    Worried  About Running Out of Food in the Last Year: Never true    Ran Out of Food in the Last Year: Never true  Transportation Needs: No Transportation Needs (10/14/2022)   PRAPARE - Administrator, Civil Service (Medical): No    Lack of Transportation (Non-Medical): No  Physical Activity: Insufficiently Active (10/14/2022)   Exercise Vital Sign    Days of Exercise per Week: 2 days    Minutes of Exercise per Session: 20 min  Stress: Not on file  Social Connections: Not on file  Intimate Partner Violence: Not on file    Outpatient Medications Prior to Visit  Medication Sig Dispense Refill   albuterol (PROVENTIL) (2.5 MG/3ML) 0.083% nebulizer solution Take 3 mLs (2.5 mg total) by nebulization every 6 (six) hours as needed for wheezing or shortness of breath (coughing fits). 75 mL 2   albuterol (VENTOLIN HFA) 108 (90 Base) MCG/ACT inhaler Inhale 2 puffs into the lungs every 4 (four) hours as needed for wheezing or shortness of breath. 6.7  g 1   Azelastine-Fluticasone (DYMISTA) 137-50 MCG/ACT SUSP Place 2 sprays into both nostrils daily as needed. 23 g 5   Azelastine-Fluticasone 137-50 MCG/ACT SUSP Place 1 spray into the nose in the morning and at bedtime. 69 g 1   budesonide (PULMICORT) 0.5 MG/2ML nebulizer solution Take 2 mLs (0.5 mg total) by nebulization in the morning and at bedtime for 1-2 weeks during upper respiratory infections. 120 mL 2   EPINEPHrine 0.3 mg/0.3 mL IJ SOAJ injection Inject 0.3 mg into the muscle as needed for anaphylaxis. 2 each 1   etonogestrel-ethinyl estradiol (NUVARING) 0.12-0.015 MG/24HR vaginal ring Insert 1 ring vaginally every month 3 each 0   fexofenadine (ALLEGRA) 180 MG tablet Take 180 mg by mouth daily.     fluticasone (FLONASE) 50 MCG/ACT nasal spray Place 1-2 sprays into both nostrils daily as needed for stuffy nose 16 g 1   Fluticasone-Umeclidin-Vilant (TRELEGY ELLIPTA) 200-62.5-25 MCG/ACT AEPB Inhale 1 puff into the lungs daily. Rinse mouth after each use 60 each 5   ipratropium (ATROVENT) 0.03 % nasal spray Place 1-2 sprays into both nostrils 2 (two) times daily as needed (nasal drainage). 30 mL 5   ipratropium-albuterol (DUONEB) 0.5-2.5 (3) MG/3ML SOLN Take 3 mLs by nebulization every 4 (four) hours as needed (coughing fits, shortness of breath, wheezing). 180 mL 2   lansoprazole (PREVACID) 30 MG capsule Take 1 capsule (30 mg total) by mouth daily at 12 noon. 30 capsule 0   levocetirizine (XYZAL) 5 MG tablet Take 5 mg by mouth every evening.     loperamide (IMODIUM) 2 MG capsule Take 1 capsule (2 mg total) by mouth every 6 (six) hours. May start with 2 capsules initially 30 capsule 0   montelukast (SINGULAIR) 10 MG tablet Take 1 tablet (10 mg total) by mouth at bedtime. 90 tablet 1   Multiple Vitamin (MULTIVITAMIN PO) Take by mouth.     olopatadine (PATADAY) 0.1 % ophthalmic solution 1 drop 2 (two) times daily.     sertraline (ZOLOFT) 25 MG tablet Take 1 tablet (25 mg total) by mouth daily.  90 tablet 3   triamcinolone ointment (KENALOG) 0.1 % Apply 1 application topically 2 (two) times daily. 30 g 2   No facility-administered medications prior to visit.    Allergies  Allergen Reactions   Latex Rash   Trimethoprim    Almond (Diagnostic) Itching   Other Itching    PT IS ALLERGIC PITTED FRUITS  Review of Systems  Constitutional:  Negative for chills and fever.  Respiratory:  Negative for shortness of breath.   Gastrointestinal:  Negative for diarrhea, nausea and vomiting.  Neurological:  Negative for dizziness.       Objective:    Physical Exam Constitutional:      General: She is not in acute distress.    Appearance: She is not ill-appearing.  HENT:     Nose: Nose normal.     Mouth/Throat:     Lips: Pink. No lesions.     Mouth: Mucous membranes are moist.     Pharynx: Oropharynx is clear.     Comments: Mild white coating on tongue, worse posteriorly  Eyes:     Extraocular Movements: Extraocular movements intact.     Conjunctiva/sclera: Conjunctivae normal.  Cardiovascular:     Rate and Rhythm: Normal rate.  Pulmonary:     Effort: Pulmonary effort is normal.  Musculoskeletal:     Cervical back: Normal range of motion and neck supple.  Lymphadenopathy:     Cervical: No cervical adenopathy.  Skin:    General: Skin is warm and dry.  Neurological:     General: No focal deficit present.     Mental Status: She is alert and oriented to person, place, and time.  Psychiatric:        Mood and Affect: Mood normal.        Behavior: Behavior normal.        Thought Content: Thought content normal.      BP 126/82 (BP Location: Left Arm, Patient Position: Sitting, Cuff Size: Large)   Pulse 80   Temp 97.6 F (36.4 C) (Temporal)   Ht 5\' 5"  (1.651 m)   Wt 242 lb (109.8 kg)   SpO2 98%   BMI 40.27 kg/m  Wt Readings from Last 3 Encounters:  12/24/22 242 lb (109.8 kg)  10/27/22 242 lb 1.6 oz (109.8 kg)  10/14/22 242 lb 3.2 oz (109.9 kg)        Assessment & Plan:   Problem List Items Addressed This Visit   None Visit Diagnoses     Oral thrush    -  Primary   Relevant Medications   fluconazole (DIFLUCAN) 150 MG tablet      History of resolution of oral thrush with a single dose of fluconazole.  This was prescribed today.  She has NuvaRing.  Follow-up if not back to baseline in 3 days.  I am having Kristina Stewart start on fluconazole. I am also having her maintain her levocetirizine, Multiple Vitamin (MULTIVITAMIN PO), triamcinolone ointment, fexofenadine, albuterol, olopatadine, sertraline, loperamide, budesonide, albuterol, ipratropium-albuterol, lansoprazole, Trelegy Ellipta, montelukast, Azelastine-Fluticasone, EPINEPHrine, ipratropium, Azelastine-Fluticasone, fluticasone, and etonogestrel-ethinyl estradiol.  Meds ordered this encounter  Medications   fluconazole (DIFLUCAN) 150 MG tablet    Sig: Take 1 tablet (150 mg) by mouth once for 1 dose.    Dispense:  1 tablet    Refill:  0    Order Specific Question:   Supervising Provider    Answer:   Hillard Danker A [[id number]]

## 2022-12-30 ENCOUNTER — Ambulatory Visit (INDEPENDENT_AMBULATORY_CARE_PROVIDER_SITE_OTHER): Payer: 59 | Admitting: *Deleted

## 2022-12-30 DIAGNOSIS — J309 Allergic rhinitis, unspecified: Secondary | ICD-10-CM

## 2023-01-07 ENCOUNTER — Ambulatory Visit (INDEPENDENT_AMBULATORY_CARE_PROVIDER_SITE_OTHER): Payer: 59

## 2023-01-07 DIAGNOSIS — J309 Allergic rhinitis, unspecified: Secondary | ICD-10-CM

## 2023-01-14 ENCOUNTER — Ambulatory Visit (INDEPENDENT_AMBULATORY_CARE_PROVIDER_SITE_OTHER): Payer: 59

## 2023-01-14 DIAGNOSIS — J309 Allergic rhinitis, unspecified: Secondary | ICD-10-CM | POA: Diagnosis not present

## 2023-01-17 ENCOUNTER — Other Ambulatory Visit (HOSPITAL_COMMUNITY): Payer: Self-pay

## 2023-01-18 ENCOUNTER — Other Ambulatory Visit (HOSPITAL_COMMUNITY): Payer: Self-pay

## 2023-01-18 ENCOUNTER — Other Ambulatory Visit: Payer: Self-pay

## 2023-01-18 MED ORDER — SERTRALINE HCL 25 MG PO TABS
25.0000 mg | ORAL_TABLET | Freq: Every day | ORAL | 0 refills | Status: DC
  Filled 2023-01-18: qty 90, 90d supply, fill #0

## 2023-01-21 ENCOUNTER — Ambulatory Visit (INDEPENDENT_AMBULATORY_CARE_PROVIDER_SITE_OTHER): Payer: 59

## 2023-01-21 DIAGNOSIS — J309 Allergic rhinitis, unspecified: Secondary | ICD-10-CM

## 2023-01-28 ENCOUNTER — Ambulatory Visit (INDEPENDENT_AMBULATORY_CARE_PROVIDER_SITE_OTHER): Payer: 59

## 2023-01-28 DIAGNOSIS — J309 Allergic rhinitis, unspecified: Secondary | ICD-10-CM

## 2023-02-08 ENCOUNTER — Ambulatory Visit (INDEPENDENT_AMBULATORY_CARE_PROVIDER_SITE_OTHER): Payer: 59

## 2023-02-08 DIAGNOSIS — J309 Allergic rhinitis, unspecified: Secondary | ICD-10-CM

## 2023-02-16 ENCOUNTER — Ambulatory Visit: Payer: Self-pay | Admitting: *Deleted

## 2023-02-16 DIAGNOSIS — J309 Allergic rhinitis, unspecified: Secondary | ICD-10-CM | POA: Diagnosis not present

## 2023-03-14 ENCOUNTER — Other Ambulatory Visit (HOSPITAL_COMMUNITY): Payer: Self-pay

## 2023-03-14 ENCOUNTER — Other Ambulatory Visit: Payer: Self-pay | Admitting: Family

## 2023-03-15 ENCOUNTER — Other Ambulatory Visit: Payer: Self-pay

## 2023-03-15 ENCOUNTER — Other Ambulatory Visit (HOSPITAL_COMMUNITY): Payer: Self-pay

## 2023-03-15 MED ORDER — FLUTICASONE PROPIONATE 50 MCG/ACT NA SUSP
1.0000 | Freq: Every day | NASAL | 5 refills | Status: DC | PRN
  Filled 2023-03-15: qty 16, 30d supply, fill #0
  Filled 2023-05-05: qty 16, 30d supply, fill #1

## 2023-03-15 MED ORDER — ETONOGESTREL-ETHINYL ESTRADIOL 0.12-0.015 MG/24HR VA RING
VAGINAL_RING | VAGINAL | 0 refills | Status: DC
  Filled 2023-03-15: qty 1, 28d supply, fill #0

## 2023-03-18 ENCOUNTER — Other Ambulatory Visit (HOSPITAL_COMMUNITY): Payer: Self-pay

## 2023-03-18 DIAGNOSIS — Z8279 Family history of other congenital malformations, deformations and chromosomal abnormalities: Secondary | ICD-10-CM | POA: Diagnosis not present

## 2023-03-18 DIAGNOSIS — F419 Anxiety disorder, unspecified: Secondary | ICD-10-CM | POA: Diagnosis not present

## 2023-03-18 DIAGNOSIS — Z6841 Body Mass Index (BMI) 40.0 and over, adult: Secondary | ICD-10-CM | POA: Diagnosis not present

## 2023-03-18 DIAGNOSIS — N898 Other specified noninflammatory disorders of vagina: Secondary | ICD-10-CM | POA: Diagnosis not present

## 2023-03-18 DIAGNOSIS — R5383 Other fatigue: Secondary | ICD-10-CM | POA: Diagnosis not present

## 2023-03-18 DIAGNOSIS — Z309 Encounter for contraceptive management, unspecified: Secondary | ICD-10-CM | POA: Diagnosis not present

## 2023-03-18 DIAGNOSIS — Z01419 Encounter for gynecological examination (general) (routine) without abnormal findings: Secondary | ICD-10-CM | POA: Diagnosis not present

## 2023-03-18 MED ORDER — SERTRALINE HCL 25 MG PO TABS
25.0000 mg | ORAL_TABLET | Freq: Every day | ORAL | 3 refills | Status: DC
  Filled 2023-03-18 – 2023-05-18 (×3): qty 90, 90d supply, fill #0
  Filled 2023-08-17: qty 90, 90d supply, fill #1
  Filled 2023-09-29: qty 90, 90d supply, fill #2

## 2023-03-18 MED ORDER — ETONOGESTREL-ETHINYL ESTRADIOL 0.12-0.015 MG/24HR VA RING
1.0000 | VAGINAL_RING | VAGINAL | 3 refills | Status: DC
  Filled 2023-03-18: qty 3, 84d supply, fill #0
  Filled 2023-03-31: qty 3, 54d supply, fill #0
  Filled 2023-04-01 – 2023-04-12 (×2): qty 3, 84d supply, fill #0
  Filled 2023-07-06: qty 3, 84d supply, fill #1
  Filled 2023-09-29: qty 3, 84d supply, fill #2
  Filled 2023-12-21 (×2): qty 3, 84d supply, fill #3

## 2023-03-31 ENCOUNTER — Other Ambulatory Visit: Payer: Self-pay | Admitting: Allergy

## 2023-03-31 ENCOUNTER — Encounter (HOSPITAL_COMMUNITY): Payer: Self-pay

## 2023-03-31 ENCOUNTER — Other Ambulatory Visit (HOSPITAL_COMMUNITY): Payer: Self-pay

## 2023-03-31 ENCOUNTER — Telehealth: Payer: Self-pay | Admitting: Family

## 2023-03-31 MED ORDER — BUDESONIDE 0.5 MG/2ML IN SUSP
0.5000 mg | Freq: Two times a day (BID) | RESPIRATORY_TRACT | 0 refills | Status: AC
  Filled 2023-03-31: qty 120, 30d supply, fill #0

## 2023-03-31 NOTE — Telephone Encounter (Signed)
Courtesy medication refill on Budesonide (Pulmicort) 0.5 mg/2 mL nebulizer solution.  Patient last seen: 10/27/22 return in 6 months - 04/26/23 Canceled (Patient)  - rescheduled for 05/17/23.  Patient will need to keep appt. for future refills.

## 2023-03-31 NOTE — Telephone Encounter (Signed)
Pt is requesting a refill for pulmicort, pt has appt on 11/18.

## 2023-04-01 ENCOUNTER — Other Ambulatory Visit (HOSPITAL_COMMUNITY): Payer: Self-pay

## 2023-04-12 ENCOUNTER — Other Ambulatory Visit (HOSPITAL_COMMUNITY): Payer: Self-pay

## 2023-04-20 ENCOUNTER — Ambulatory Visit (INDEPENDENT_AMBULATORY_CARE_PROVIDER_SITE_OTHER): Payer: Self-pay | Admitting: *Deleted

## 2023-04-20 DIAGNOSIS — J309 Allergic rhinitis, unspecified: Secondary | ICD-10-CM

## 2023-04-23 ENCOUNTER — Other Ambulatory Visit: Payer: Self-pay | Admitting: Allergy

## 2023-04-26 ENCOUNTER — Ambulatory Visit: Payer: 59 | Admitting: Family

## 2023-04-26 ENCOUNTER — Other Ambulatory Visit (HOSPITAL_COMMUNITY): Payer: Self-pay

## 2023-05-04 ENCOUNTER — Ambulatory Visit (INDEPENDENT_AMBULATORY_CARE_PROVIDER_SITE_OTHER): Payer: 59 | Admitting: *Deleted

## 2023-05-04 DIAGNOSIS — J309 Allergic rhinitis, unspecified: Secondary | ICD-10-CM | POA: Diagnosis not present

## 2023-05-06 ENCOUNTER — Encounter: Payer: Self-pay | Admitting: Pharmacist

## 2023-05-06 ENCOUNTER — Other Ambulatory Visit: Payer: Self-pay

## 2023-05-06 ENCOUNTER — Other Ambulatory Visit (HOSPITAL_COMMUNITY): Payer: Self-pay

## 2023-05-11 ENCOUNTER — Other Ambulatory Visit: Payer: Self-pay

## 2023-05-13 ENCOUNTER — Encounter: Payer: 59 | Admitting: Family Medicine

## 2023-05-14 ENCOUNTER — Encounter: Payer: 59 | Admitting: Family Medicine

## 2023-05-16 NOTE — Progress Notes (Unsigned)
Follow Up Note  RE: Kristina Stewart MRN: [id number] DOB: 08-04-1985 Date of Office Visit: 05/17/2023  Referring provider: Avanell Shackleton, NP-C Primary care provider: Avanell Shackleton, NP-C  Chief Complaint: No chief complaint on file.  History of Present Illness: I had the pleasure of seeing Kristina Stewart for a follow up visit at the [facility] on 05/16/2023. She is a 37 y.o. female, who is being followed for asthma, allergic rhinoconjunctivitis on AIT, frequent respiratory infections, dyshidrotic asthma, oral allergy syndrome. Her previous allergy office visit was on 10/27/2022 with Nehemiah Settle FNP. Today is a regular follow up visit.  Discussed the use of AI scribe software for clinical note transcription with the patient, who gave verbal consent to proceed.  History of Present Illness           I reviewed your labwork. Your blood count, immunoglobulin levels were normal which is great. You also have good protection against diptheria and tetanus. No immuno deficiency based on these results.   Your pneumococcal titers were adequate (protective against 12 out of 23 strains). Sometimes people with low titers are more likely to develop respiratory infections caused by the bacteria strep pneumoniae. Keep track of infections and antibiotics use. If still getting frequent respiratory infections, then Im going to recommend that you get the pneumovax 23 vaccine (also known as the pneumonia shot) as it can boost the levels and offer protection against this bacteria in the future.  Moderate persistent asthma without complication Past history - Diagnosed with asthma in middle school and was doing well up until a few years ago.  Main triggers include allergies, weather change, infection, strong scents and perfumes.  During these flares she takes Advair 250 1 puff twice a day, Singulair 10 mg daily and albuterol as needed with good benefit. 2020 spirometry was  normal.   Daily controller medication(s):  Continue Trelegy 1 puff once a day and rinse mouth after each use.  During upper respiratory infections/flares:  Start Pulmicort (budesonide) 0.5mg  nebulizer twice a day for 1-2 weeks until your breathing symptoms return to baseline.  Pretreat with albuterol 2 puffs or albuterol nebulizer.  If you need to use your albuterol nebulizer machine back to back within 15-30 minutes with no relief then please go to the ER/urgent care for further evaluation.  May use albuterol rescue inhaler 2 puffs or nebulizer every 4 to 6 hours as needed for shortness of breath, chest tightness, coughing, and wheezing. May use albuterol rescue inhaler 2 puffs 5 to 15 minutes prior to strenuous physical activities. Monitor frequency of use.    Seasonal and perennial allergic rhinoconjunctivitis Past history - Perennial rhinoconjunctivitis symptoms for the last 10+ years with worsening during change of season. 2020 skin testing showed: Positive to grass, weed, ragweed, trees, mold. Started AIT on 07/06/2019 (MOLD, [id number]) and stopped due to pregnancy. Continue environmental control measures. Continue dymista (fluticasone + azelastine nasal spray combination) 1 spray per nostril twice a day. Use Atrovent (ipratropium) 0.03% 1-2 sprays per nostril twice a day as needed for runny nose/drainage. Nasal saline spray (i.e., Simply Saline) or nasal saline lavage (i.e., NeilMed) is recommended as needed and prior to medicated nasal sprays. Use over the counter antihistamines such as Zyrtec (cetirizine), Claritin (loratadine), Allegra (fexofenadine), or Xyzal (levocetirizine) daily as needed. May take twice a day during allergy flares. May switch antihistamines every few months. Continue Singulair (montelukast) 10mg  daily at night. Continue allergy injections - okay to get as  long as you didn't use your albuterol the day before or the day of injection.    History of frequent upper  respiratory infection Past history - 4 sinus infections per year. Wants to know if she needs a pneumonia vaccine. Interim history- no infections since last office visit Keep track of infections and antibiotics use. Get bloodwork to look at immune system.   Dyshidrotic eczema Past history - Dyshidrotic eczema on the palms bilaterally.  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   Use triamcinolone 0.1% ointment twice a day as needed for eczema flares. Do not use on the face, neck, armpits or groin area. Do not use more than 3 weeks in a row.  Use Eucrisa (crisaborole) 2% ointment twice a day on mild rash flares on the face and body. This is a non-steroid ointment.  Continue proper skin care.   Pollen-food allergy Past history - Certain raw fruits and almonds cause perioral pruritus.  Tolerates processed and cooked forms with no issues.  Patient questionably had a reaction to pistachios but it was cross contaminated almonds in the past.  She tolerates other tree nuts including cashews, walnuts, pecans and hazelnuts with no issues. 2020 skin testing was borderline positive to almonds and negative pistachios. Okay with pistachios and nectarines.  For mild symptoms you can take over the counter antihistamines such as Benadryl and monitor symptoms closely. If symptoms worsen or if you have severe symptoms including breathing issues, throat closure, significant swelling, whole body hives, severe diarrhea and vomiting, lightheadedness then inject epinephrine and seek immediate medical care afterwards.  Assessment and Plan: [name] is a 37 y.o. female with: *** Assessment and Plan              No follow-ups on file.  No orders of the defined types were placed in this encounter.  Lab Orders  No laboratory test(s) ordered today    Diagnostics: Spirometry:  Tracings reviewed. Her effort: {Blank single:19197::"Good reproducible efforts.","It was hard  to get consistent efforts and there is a question as to whether this reflects a maximal maneuver.","Poor effort, data can not be interpreted."} FVC: ***L FEV1: ***L, ***% predicted FEV1/FVC ratio: ***% Interpretation: {Blank single:19197::"Spirometry consistent with mild obstructive disease","Spirometry consistent with moderate obstructive disease","Spirometry consistent with severe obstructive disease","Spirometry consistent with possible restrictive disease","Spirometry consistent with mixed obstructive and restrictive disease","Spirometry uninterpretable due to technique","Spirometry consistent with normal pattern","No overt abnormalities noted given today's efforts"}.  Please see scanned spirometry results for details.  Skin Testing: {Blank single:19197::"Select foods","Environmental allergy panel","Environmental allergy panel and select foods","Food allergy panel","None","Deferred due to recent antihistamines use"}. *** Results discussed with patient/family.   Medication List:  Current Outpatient Medications  Medication Sig Dispense Refill   albuterol (PROVENTIL) (2.5 MG/3ML) 0.083% nebulizer solution Take 3 mLs (2.5 mg total) by nebulization every 6 (six) hours as needed for wheezing or shortness of breath (coughing fits). 75 mL 2   albuterol (VENTOLIN HFA) 108 (90 Base) MCG/ACT inhaler Inhale 2 puffs into the lungs every 4 (four) hours as needed for wheezing or shortness of breath. 6.7 g 1   Azelastine-Fluticasone (DYMISTA) 137-50 MCG/ACT SUSP Place 2 sprays into both nostrils daily as needed. 23 g 5   Azelastine-Fluticasone 137-50 MCG/ACT SUSP Place 1 spray into the nose in the morning and at bedtime. 69 g 1   budesonide (PULMICORT) 0.5 MG/2ML nebulizer solution inhale 2 mLs (0.5 mg total) by nebulization in the morning and at bedtime for 1-2 weeks during  upper respiratory infections. 120 mL 0   EPINEPHrine 0.3 mg/0.3 mL IJ SOAJ injection Inject 0.3 mg into the muscle as needed for  anaphylaxis. 2 each 1   etonogestrel-ethinyl estradiol (NUVARING) 0.12-0.015 MG/24HR vaginal ring Insert 1 ring vaginally as directed. 3 each 3   fexofenadine (ALLEGRA) 180 MG tablet Take 180 mg by mouth daily.     fluticasone (FLONASE) 50 MCG/ACT nasal spray Place 1-2 sprays into both nostrils daily as needed for stuffy nose 16 g 5   Fluticasone-Umeclidin-Vilant (TRELEGY ELLIPTA) 200-62.5-25 MCG/ACT AEPB Inhale 1 puff into the lungs daily. Rinse mouth after each use 60 each 5   ipratropium (ATROVENT) 0.03 % nasal spray Place 1-2 sprays into both nostrils 2 (two) times daily as needed (nasal drainage). 30 mL 5   ipratropium-albuterol (DUONEB) 0.5-2.5 (3) MG/3ML SOLN Take 3 mLs by nebulization every 4 (four) hours as needed (coughing fits, shortness of breath, wheezing). 180 mL 2   lansoprazole (PREVACID) 30 MG capsule Take 1 capsule (30 mg total) by mouth daily at 12 noon. 30 capsule 0   levocetirizine (XYZAL) 5 MG tablet Take 5 mg by mouth every evening.     loperamide (IMODIUM) 2 MG capsule Take 1 capsule (2 mg total) by mouth every 6 (six) hours. May start with 2 capsules initially 30 capsule 0   montelukast (SINGULAIR) 10 MG tablet Take 1 tablet (10 mg total) by mouth at bedtime. 90 tablet 1   Multiple Vitamin (MULTIVITAMIN PO) Take by mouth.     olopatadine (PATADAY) 0.1 % ophthalmic solution 1 drop 2 (two) times daily.     sertraline (ZOLOFT) 25 MG tablet Take 1 tablet (25 mg total) by mouth daily. 90 tablet 3   triamcinolone ointment (KENALOG) 0.1 % Apply 1 application topically 2 (two) times daily. 30 g 2   No current facility-administered medications for this visit.   Allergies: Allergies  Allergen Reactions   Latex Rash   Trimethoprim    Almond (Diagnostic) Itching   Other Itching    PT IS ALLERGIC PITTED FRUITS   I reviewed her past medical history, social history, family history, and environmental history and no significant changes have been reported from her previous  visit.  Review of Systems  Constitutional:  Negative for appetite change, chills, fever and unexpected weight change.  HENT:  Positive for postnasal drip and rhinorrhea.   Eyes:  Positive for itching.  Respiratory:  Positive for cough. Negative for chest tightness, shortness of breath and wheezing.   Cardiovascular:  Negative for chest pain.  Gastrointestinal:  Negative for abdominal pain.  Genitourinary:  Negative for difficulty urinating.  Skin:  Negative for rash.  Allergic/Immunologic: Positive for environmental allergies and food allergies.    Objective: There were no vitals taken for this visit. There is no height or weight on file to calculate BMI. Physical Exam Constitutional:      Appearance: Normal appearance.  HENT:     Head: Normocephalic and atraumatic.     Comments: Pharynx normal, eyes normal, ears normal, nose: Bilateral lower turbinates mildly edematous with no drainage noted    Right Ear: Tympanic membrane, ear canal and external ear normal.     Left Ear: Tympanic membrane, ear canal and external ear normal.     Mouth/Throat:     Mouth: Mucous membranes are moist.     Pharynx: Oropharynx is clear.  Eyes:     Conjunctiva/sclera: Conjunctivae normal.  Cardiovascular:     Rate and Rhythm: Regular rhythm.  Heart sounds: Normal heart sounds.  Pulmonary:     Effort: Pulmonary effort is normal.     Breath sounds: Normal breath sounds.     Comments: Lungs clear to auscultation Musculoskeletal:     Cervical back: Neck supple.  Skin:    General: Skin is warm.  Neurological:     Mental Status: She is alert and oriented to person, place, and time.  Psychiatric:        Mood and Affect: Mood normal.        Behavior: Behavior normal.        Thought Content: Thought content normal.        Judgment: Judgment normal.    Previous notes and tests were reviewed. The plan was reviewed with the patient/family, and all questions/concerned were addressed.  It was my  pleasure to see Tishawna today and participate in her care. Please feel free to contact me with any questions or concerns.  Sincerely,  Wyline Mood, DO Allergy & Immunology  [facility] office: [phone] [facility] office: [phone]

## 2023-05-17 ENCOUNTER — Ambulatory Visit: Payer: 59 | Admitting: Allergy

## 2023-05-17 ENCOUNTER — Encounter: Payer: Self-pay | Admitting: Allergy

## 2023-05-17 ENCOUNTER — Other Ambulatory Visit (HOSPITAL_COMMUNITY): Payer: Self-pay

## 2023-05-17 ENCOUNTER — Other Ambulatory Visit: Payer: Self-pay

## 2023-05-17 VITALS — BP 128/84 | HR 96 | Temp 98.6°F | Resp 16 | Ht 65.0 in | Wt 245.4 lb

## 2023-05-17 DIAGNOSIS — L301 Dyshidrosis [pompholyx]: Secondary | ICD-10-CM | POA: Diagnosis not present

## 2023-05-17 DIAGNOSIS — H1010 Acute atopic conjunctivitis, unspecified eye: Secondary | ICD-10-CM | POA: Diagnosis not present

## 2023-05-17 DIAGNOSIS — T781XXD Other adverse food reactions, not elsewhere classified, subsequent encounter: Secondary | ICD-10-CM

## 2023-05-17 DIAGNOSIS — J3089 Other allergic rhinitis: Secondary | ICD-10-CM | POA: Diagnosis not present

## 2023-05-17 DIAGNOSIS — Z8709 Personal history of other diseases of the respiratory system: Secondary | ICD-10-CM | POA: Diagnosis not present

## 2023-05-17 DIAGNOSIS — J302 Other seasonal allergic rhinitis: Secondary | ICD-10-CM

## 2023-05-17 DIAGNOSIS — J4540 Moderate persistent asthma, uncomplicated: Secondary | ICD-10-CM

## 2023-05-17 DIAGNOSIS — J040 Acute laryngitis: Secondary | ICD-10-CM

## 2023-05-17 MED ORDER — MONTELUKAST SODIUM 10 MG PO TABS
10.0000 mg | ORAL_TABLET | Freq: Every day | ORAL | 3 refills | Status: DC
  Filled 2023-05-17: qty 90, 90d supply, fill #0
  Filled 2023-08-17: qty 90, 90d supply, fill #1
  Filled 2023-09-29 – 2023-11-25 (×2): qty 90, 90d supply, fill #2
  Filled 2024-02-24: qty 90, 90d supply, fill #3

## 2023-05-17 MED ORDER — LEVOCETIRIZINE DIHYDROCHLORIDE 5 MG PO TABS
5.0000 mg | ORAL_TABLET | Freq: Every evening | ORAL | 3 refills | Status: AC
  Filled 2023-05-17: qty 90, 90d supply, fill #0
  Filled 2023-08-17: qty 90, 90d supply, fill #1
  Filled 2024-05-01: qty 90, 90d supply, fill #2

## 2023-05-17 MED ORDER — AZELASTINE-FLUTICASONE 137-50 MCG/ACT NA SUSP
1.0000 | Freq: Two times a day (BID) | NASAL | 5 refills | Status: DC
  Filled 2023-05-17: qty 23, 30d supply, fill #0
  Filled 2023-07-06: qty 23, 30d supply, fill #1
  Filled 2023-08-17: qty 23, 30d supply, fill #2
  Filled 2023-09-29 – 2023-10-14 (×2): qty 23, 30d supply, fill #3
  Filled 2023-11-25: qty 23, 30d supply, fill #4
  Filled 2023-12-21 (×2): qty 23, 30d supply, fill #5

## 2023-05-17 NOTE — Patient Instructions (Addendum)
Asthma Daily controller medication(s):  Trelegy 1 puff once a day and rinse mouth after each use.  During upper respiratory infections/flares:  Start Pulmicort (budesonide) 0.5mg  nebulizer twice a day for 1-2 weeks until your breathing symptoms return to baseline.  Pretreat with albuterol 2 puffs or albuterol nebulizer.  If you need to use your albuterol nebulizer machine back to back within 15-30 minutes with no relief then please go to the ER/urgent care for further evaluation.  May use albuterol rescue inhaler 2 puffs or nebulizer every 4 to 6 hours as needed for shortness of breath, chest tightness, coughing, and wheezing. May use albuterol rescue inhaler 2 puffs 5 to 15 minutes prior to strenuous physical activities. Monitor frequency of use - if you need to use it more than twice per week on a consistent basis let us know.  Asthma control goals:  Full participation in all desired activities (may need albuterol before activity) Albuterol use two times or less a week on average (not counting use with activity) Cough interfering with sleep two times or less a month Oral steroids no more than once a year No hospitalizations   Allergic conjunctivitis Past skin testing positive to grass, weed, ragweed, trees, mold.  Continue environmental control measures. Start dymista (fluticasone + azelastine nasal spray combination) 1 spray per nostril twice a day. This replaces Flonase (fluticasone) for now. If it's not covered let us know.  Nasal saline spray (i.e., Simply Saline) or nasal saline lavage (i.e., NeilMed) is recommended as needed and prior to medicated nasal sprays.  Use Atrovent (ipratropium) 0.03% 1-2 sprays per nostril twice a day as needed for runny nose/drainage. Nasal saline spray (i.e., Simply Saline) or nasal saline lavage (i.e., NeilMed) is recommended as needed and prior to medicated nasal sprays. Use over the counter antihistamines such as Zyrtec (cetirizine), Claritin  (loratadine), Allegra (fexofenadine), or Xyzal (levocetirizine) daily as needed. May take twice a day during allergy flares. May switch antihistamines every few months. Continue Singulair (montelukast) 10mg  daily at night. Continue allergy injections - okay to get as long as you didn't use your albuterol the day before or the day of injection.   Infections Keep track of infections and antibiotics use.   Pollen-food allergy Avoid foods that are bothersome. For mild symptoms you can take over the counter antihistamines such as Benadryl and monitor symptoms closely. If symptoms worsen or if you have severe symptoms including breathing issues, throat closure, significant swelling, whole body hives, severe diarrhea and vomiting, lightheadedness then inject epinephrine and seek immediate medical care afterwards.   Dyshidrotic eczema Use triamcinolone 0.1% ointment twice a day as needed for eczema flares. Do not use on the face, neck, armpits or groin area. Do not use more than 3 weeks in a row.  Continue proper skin care.  Follow up in 4 months or sooner if needed.

## 2023-05-18 ENCOUNTER — Other Ambulatory Visit (HOSPITAL_COMMUNITY): Payer: Self-pay

## 2023-05-18 ENCOUNTER — Other Ambulatory Visit: Payer: Self-pay

## 2023-05-18 ENCOUNTER — Other Ambulatory Visit: Payer: Self-pay | Admitting: Allergy

## 2023-05-18 MED ORDER — ALBUTEROL SULFATE HFA 108 (90 BASE) MCG/ACT IN AERS
2.0000 | INHALATION_SPRAY | RESPIRATORY_TRACT | 1 refills | Status: DC | PRN
  Filled 2023-05-18: qty 6.7, 25d supply, fill #0

## 2023-06-03 ENCOUNTER — Ambulatory Visit (INDEPENDENT_AMBULATORY_CARE_PROVIDER_SITE_OTHER): Payer: 59 | Admitting: *Deleted

## 2023-06-03 DIAGNOSIS — J309 Allergic rhinitis, unspecified: Secondary | ICD-10-CM

## 2023-06-15 ENCOUNTER — Other Ambulatory Visit (HOSPITAL_COMMUNITY): Payer: Self-pay

## 2023-06-15 ENCOUNTER — Telehealth: Payer: 59 | Admitting: Physician Assistant

## 2023-06-15 DIAGNOSIS — H6990 Unspecified Eustachian tube disorder, unspecified ear: Secondary | ICD-10-CM

## 2023-06-15 MED ORDER — FLUTICASONE PROPIONATE 50 MCG/ACT NA SUSP
2.0000 | Freq: Every day | NASAL | 0 refills | Status: DC
Start: 2023-06-15 — End: 2023-09-13
  Filled 2023-06-15: qty 16, 30d supply, fill #0

## 2023-06-15 MED ORDER — PREDNISONE 20 MG PO TABS
40.0000 mg | ORAL_TABLET | Freq: Every day | ORAL | 0 refills | Status: DC
  Filled 2023-06-15: qty 6, 3d supply, fill #0

## 2023-06-15 NOTE — Progress Notes (Signed)
Message sent to patient requesting further input regarding current symptoms. Awaiting patient response.  

## 2023-06-15 NOTE — Progress Notes (Signed)
E-Visit for Ear Pain - Eustachian Tube Dysfunction   We are sorry that you are not feeling well. Here is how we plan to help!  Based on what you have shared with me it looks like you have Eustachian Tube Dysfunction.  Eustachian Tube Dysfunction is a condition where the tubes that connect your middle ears to your upper throat become blocked. This can lead to discomfort, hearing difficulties and a feeling of fullness in your ear. Eustachian tube dysfunction usually resolves itself in a few days. The usual symptoms include: Hearing problems Tinnitus, or ringing in your ears Clicking or popping sounds A feeling of fullness in your ears Pain that mimics an ear infection Dizziness, vertigo or balance problems A "tickling" sensation in your ears  ?Eustachian tube dysfunction symptoms may get worse in higher altitudes. This is called barotrauma, and it can happen while scuba diving, flying in an airplane or driving in the mountains.   What causes eustachian tube dysfunction? Allergies and infections (like the common cold and the flu) are the most common causes of eustachian tube dysfunction. These conditions can cause inflammation and mucus buildup, leading to blockage. GERD, or chronic acid reflux, can also cause ETD. This is because stomach acid can back up into your throat and result in inflammation. As mentioned above, altitude changes can also cause ETD.   What are some common eustachian tube dysfunction treatments? In most cases, treatment isn't necessary because ETD often resolves on its own. However, you might need treatment if your symptoms linger for more than two weeks.    Eustachian tube dysfunction treatment depends on the cause and the severity of your condition. Treatments may include home remedies, medications or, in severe cases, surgery.     HOME CARE: Sometimes simple home remedies can help with mild cases of eustachian tube dysfunction. To try and clear the blockage, you  can: Chew gum. Yawn. Swallow. Try the Valsalva maneuver (breathing out forcefully while closing your mouth and pinching your nostrils). Use a saline spray to clear out nasal passages.  MEDICATIONS: Over-the-counter medications can help if allergies are causing eustachian tube dysfunction. Try antihistamines (like cetirizine or diphenhydramine) to ease your symptoms. If you have discomfort, pain relievers -- such as acetaminophen or ibuprofen -- can help.  Sometimes intranasal glucocorticosteroids (like Flonase or Nasacort) help.  I have prescribed Fluticasone 50 mcg/spray 2 sprays in each nostril daily for 10-14 days. I have also prescribed a short course of prednisone (40 mg) to take as directed for 3 days.    GET HELP RIGHT AWAY IF: Fever is over 102.2 degrees. You develop progressive ear pain or hearing loss. Ear symptoms persist longer than 3 days after treatment.  MAKE SURE YOU: Understand these instructions. Will watch your condition. Will get help right away if you are not doing well or get worse.  Thank you for choosing an e-visit.  Your e-visit answers were reviewed by a board certified advanced clinical practitioner to complete your personal care plan. Depending upon the condition, your plan could have included both over the counter or prescription medications.  Please review your pharmacy choice. Make sure the pharmacy is open so you can pick up the prescription now. If there is a problem, you may contact your provider through [employer] and have the prescription routed to another pharmacy.  Your safety is important to [place]. If you have drug allergies check your prescription carefully.   For the next 24 hours you can use MyChart to ask questions  about today's visit, request a non-urgent call back, or ask for a work or school excuse. You will get an email with a survey after your eVisit asking about your experience. We would appreciate your feedback. I hope that your  e-visit has been valuable and will aid in your recovery.

## 2023-06-15 NOTE — Progress Notes (Signed)
I have spent 5 minutes in review of e-visit questionnaire, review and updating patient chart, medical decision making and response to patient.   Mia Milan Cody Jacklynn Dehaas, PA-C    

## 2023-06-25 ENCOUNTER — Other Ambulatory Visit (HOSPITAL_COMMUNITY): Payer: Self-pay

## 2023-06-28 ENCOUNTER — Ambulatory Visit (INDEPENDENT_AMBULATORY_CARE_PROVIDER_SITE_OTHER): Payer: Self-pay | Admitting: *Deleted

## 2023-06-28 DIAGNOSIS — J309 Allergic rhinitis, unspecified: Secondary | ICD-10-CM | POA: Diagnosis not present

## 2023-07-05 ENCOUNTER — Telehealth: Payer: Commercial Managed Care - PPO | Admitting: Physician Assistant

## 2023-07-05 ENCOUNTER — Other Ambulatory Visit (HOSPITAL_COMMUNITY): Payer: Self-pay

## 2023-07-05 DIAGNOSIS — B9689 Other specified bacterial agents as the cause of diseases classified elsewhere: Secondary | ICD-10-CM | POA: Diagnosis not present

## 2023-07-05 DIAGNOSIS — J019 Acute sinusitis, unspecified: Secondary | ICD-10-CM | POA: Diagnosis not present

## 2023-07-05 MED ORDER — AMOXICILLIN-POT CLAVULANATE 875-125 MG PO TABS
1.0000 | ORAL_TABLET | Freq: Two times a day (BID) | ORAL | 0 refills | Status: AC
  Filled 2023-07-05: qty 14, 7d supply, fill #0

## 2023-07-05 NOTE — Progress Notes (Signed)

## 2023-07-06 ENCOUNTER — Other Ambulatory Visit (HOSPITAL_COMMUNITY): Payer: Self-pay

## 2023-07-14 ENCOUNTER — Ambulatory Visit: Payer: Commercial Managed Care - PPO | Admitting: Family Medicine

## 2023-07-14 ENCOUNTER — Encounter: Payer: Self-pay | Admitting: Family Medicine

## 2023-07-14 VITALS — BP 114/76 | HR 81 | Temp 97.6°F | Ht 65.0 in | Wt 246.0 lb

## 2023-07-14 DIAGNOSIS — Z8349 Family history of other endocrine, nutritional and metabolic diseases: Secondary | ICD-10-CM | POA: Diagnosis not present

## 2023-07-14 DIAGNOSIS — Z6841 Body Mass Index (BMI) 40.0 and over, adult: Secondary | ICD-10-CM | POA: Diagnosis not present

## 2023-07-14 DIAGNOSIS — K219 Gastro-esophageal reflux disease without esophagitis: Secondary | ICD-10-CM

## 2023-07-14 DIAGNOSIS — E7800 Pure hypercholesterolemia, unspecified: Secondary | ICD-10-CM | POA: Diagnosis not present

## 2023-07-14 DIAGNOSIS — Z1159 Encounter for screening for other viral diseases: Secondary | ICD-10-CM | POA: Diagnosis not present

## 2023-07-14 DIAGNOSIS — E559 Vitamin D deficiency, unspecified: Secondary | ICD-10-CM

## 2023-07-14 DIAGNOSIS — Z0001 Encounter for general adult medical examination with abnormal findings: Secondary | ICD-10-CM

## 2023-07-14 NOTE — Assessment & Plan Note (Signed)
 Hx of vitamin D  def. Check vitamin D  level and follow up

## 2023-07-14 NOTE — Assessment & Plan Note (Signed)
 Check labs to look for complications related to obesity. Discussed trying metformin  once I have lab results and she would like to proceed with this plan. Declines referral to [facility] for now.

## 2023-07-14 NOTE — Patient Instructions (Addendum)
 I ordered your labs to be done at [facility].   Please go fasting and hydrated.     Preventive Care 36-36 Years Old, Female Preventive care refers to lifestyle choices and visits with your health care provider that can promote health and wellness. Preventive care visits are also called wellness exams. What can I expect for my preventive care visit? Counseling During your preventive care visit, your health care provider may ask about your: Medical history, including: Past medical problems. Family medical history. Pregnancy history. Current health, including: Menstrual cycle. Method of birth control. Emotional well-being. Home life and relationship well-being. Sexual activity and sexual health. Lifestyle, including: Alcohol, nicotine or tobacco, and drug use. Access to firearms. Diet, exercise, and sleep habits. Work and work Astronomer. Sunscreen use. Safety issues such as seatbelt and bike helmet use. Physical exam Your health care provider may check your: Height and weight. These may be used to calculate your BMI (body mass index). BMI is a measurement that tells if you are at a healthy weight. Waist circumference. This measures the distance around your waistline. This measurement also tells if you are at a healthy weight and may help predict your risk of certain diseases, such as type 2 diabetes and high blood pressure. Heart rate and blood pressure. Body temperature. Skin for abnormal spots. What immunizations do I need?  Vaccines are usually given at various ages, according to a schedule. Your health care provider will recommend vaccines for you based on your age, medical history, and lifestyle or other factors, such as travel or where you work. What tests do I need? Screening Your health care provider may recommend screening tests for certain conditions. This may include: Pelvic exam and Pap test. Lipid and cholesterol levels. Diabetes screening. This is done by checking  your blood sugar (glucose) after you have not eaten for a while (fasting). Hepatitis B test. Hepatitis C test. HIV (human immunodeficiency virus) test. STI (sexually transmitted infection) testing, if you are at risk. BRCA-related cancer screening. This may be done if you have a family history of breast, ovarian, tubal, or peritoneal cancers. Talk with your health care provider about your test results, treatment options, and if necessary, the need for more tests. Follow these instructions at home: Eating and drinking  Eat a healthy diet that includes fresh fruits and vegetables, whole grains, lean protein, and low-fat dairy products. Take vitamin and mineral supplements as recommended by your health care provider. Do not drink alcohol if: Your health care provider tells you not to drink. You are pregnant, may be pregnant, or are planning to become pregnant. If you drink alcohol: Limit how much you have to 0-1 drink a day. Know how much alcohol is in your drink. In the [place] one drink equals one 12 oz bottle of beer (355 mL), one 5 oz glass of wine (148 mL), or one 1 oz glass of hard liquor (44 mL). Lifestyle Brush your teeth every morning and night with fluoride toothpaste. Floss one time each day. Exercise for at least 30 minutes 5 or more days each week. Do not use any products that contain nicotine or tobacco. These products include cigarettes, chewing tobacco, and vaping devices, such as e-cigarettes. If you need help quitting, ask your health care provider. Do not use drugs. If you are sexually active, practice safe sex. Use a condom or other form of protection to prevent STIs. If you do not wish to become pregnant, use a form of birth control. If you plan to  become pregnant, see your health care provider for a prepregnancy visit. Find healthy ways to manage stress, such as: Meditation, yoga, or listening to music. Journaling. Talking to a trusted person. Spending time with friends  and family. Minimize exposure to UV radiation to reduce your risk of skin cancer. Safety Always wear your seat belt while driving or riding in a vehicle. Do not drive: If you have been drinking alcohol. Do not ride with someone who has been drinking. If you have been using any mind-altering substances or drugs. While texting. When you are tired or distracted. Wear a helmet and other protective equipment during sports activities. If you have firearms in your house, make sure you follow all gun safety procedures. Seek help if you have been physically or sexually abused. What's next? Go to your health care provider once a year for an annual wellness visit. Ask your health care provider how often you should have your eyes and teeth checked. Stay up to date on all vaccines. This information is not intended to replace advice given to you by your health care provider. Make sure you discuss any questions you have with your health care provider. Document Revised: 12/11/2020 Document Reviewed: 12/11/2020 Elsevier Patient Education  2024 [employer].

## 2023-07-14 NOTE — Assessment & Plan Note (Signed)
 Discussed lifestyle modifications for GERD. Handout for food choices for GERD  She has been taking Pepcid daily. Tried PPI in past. Referral made to GI for further evaluation

## 2023-07-14 NOTE — Assessment & Plan Note (Signed)
 Preventive health care reviewed. UTD with OB/GYN.  Counseling on healthy lifestyle including diet and exercise.  Recommend regular dental and eye exams.  Immunizations reviewed.  Discussed safety.

## 2023-07-14 NOTE — Assessment & Plan Note (Signed)
 Check thyroid  panel incl antibodies

## 2023-07-14 NOTE — Assessment & Plan Note (Signed)
 Plant based diet mostly. Check fasting lipids and follow up

## 2023-07-14 NOTE — Progress Notes (Signed)
 Complete physical exam  Patient: Kristina Stewart   DOB: Feb 10, 1986   37 y.o. Female  MRN: [id number]  Subjective:    Chief Complaint  Patient presents with   Annual Exam    Not fasting   She is here for a complete physical exam.   Other providers: Dr. Burdette Carolin - allergy and asthma  OB/GYN- Dr. Finis Hugger   Mostly plant based diet   Taking Pepcid for acid reflux daily. She has taken omeprazole in the past.  Would like to see GI.  Diet with tomatoes, spices, and coffee   Works as Patent examiner for [employer]  Topic Date Due   Hepatitis C Screening  Never done   Flu Shot  01/28/2023   COVID-19 Vaccine (5 - 2024-25 season) 02/28/2023   Pap with HPV screening  04/18/2023   DTaP/Tdap/Td vaccine (3 - Td or Tdap) 04/23/2030   Pneumococcal Vaccination  Completed   HIV Screening  Completed   HPV Vaccine  Aged Out    Wears seatbelt always, uses sunscreen, smoke detectors in home and functioning, does not text while driving, feels safe in home environment.  Depression screening:    12/24/2022    4:26 PM 12/18/2021    8:20 AM 03/24/2021    8:47 AM  Depression screen PHQ 2/9  Decreased Interest 0 0 0  Down, Depressed, Hopeless 0 0 0  PHQ - 2 Score 0 0 0   Anxiety Screening:     No data to display          Vision:Not within last year  and Dental: No current dental problems and Receives regular dental care  Patient Active Problem List   Diagnosis Date Noted   Severe obesity (BMI >= 40) (HCC) 07/14/2023   Family history of Hashimoto thyroiditis 07/14/2023   Pure hypercholesterolemia 07/14/2023   Vitamin D  deficiency 07/14/2023   Gastroesophageal reflux disease 07/14/2023   Intermittent abdominal pain 01/28/2022   Epigastric abdominal tenderness without rebound tenderness 01/07/2022   Fragile X syndrome 12/18/2021   Irregular periods 12/18/2021   Fatigue 12/18/2021   Acute cystitis with hematuria 12/18/2021   Overuse syndrome of hand 03/24/2021    Encounter for general adult medical examination with abnormal findings 03/24/2021   Iron deficiency anemia due to chronic blood loss 03/24/2021   Moderate persistent asthma without complication 11/14/2020   Normal labor and delivery 07/09/2020   Pregnancy 11/09/2019   Seasonal and perennial allergic rhinoconjunctivitis 09/11/2019   Pollen-food allergy 06/12/2019   Dyshidrotic eczema 06/12/2019   Family history of fragile X syndrome 03/25/2016   History of frequent upper respiratory infection 04/26/2012   MTHFR mutation 04/01/2012   Homozygous MTHFR mutation [id number] 01/18/2012   Past Medical History:  Diagnosis Date   Allergy    Asthma    Family history of adverse reaction to anesthesia    sister problems with propofol   IBS (irritable bowel syndrome)    Obesity    Seasonal allergic rhinitis    Strep pharyngitis    TMJ (temporomandibular joint syndrome)    Vertigo    Past Surgical History:  Procedure Laterality Date   BRAIN SURGERY     craniotomy/arnold chiari malformation; craniotomy & laminectomy   BRAIN SURGERY     craniotomy/arnold chiari malformation   Social History   Tobacco Use   Smoking status: Never   Smokeless tobacco: Never  Vaping Use   Vaping status: Never Used  Substance Use Topics   Alcohol use:  Yes    Comment: 1 glass per month   Drug use: No      Patient Care Team: Abram Abraham, NP-C as PCP - General (Family Medicine) Sheryle Donning, MD as PCP - Cardiology (Cardiology) Trudy Fusi, DO as Consulting Physician (Allergy) Concepcion Deck, MD as Consulting Physician (Obstetrics and Gynecology)   Outpatient Medications Prior to Visit  Medication Sig   albuterol  (PROVENTIL ) (2.5 MG/3ML) 0.083% nebulizer solution Take 3 mLs (2.5 mg total) by nebulization every 6 (six) hours as needed for wheezing or shortness of breath (coughing fits).   albuterol  (VENTOLIN  HFA) 108 (90 Base) MCG/ACT inhaler Inhale 2 puffs into the lungs every 4 (four)  hours as needed for wheezing or shortness of breath.   Azelastine -Fluticasone  137-50 MCG/ACT SUSP Place 1 spray into the nose in the morning and at bedtime.   budesonide  (PULMICORT ) 0.5 MG/2ML nebulizer solution inhale 2 mLs (0.5 mg total) by nebulization in the morning and at bedtime for 1-2 weeks during upper respiratory infections.   EPINEPHrine  0.3 mg/0.3 mL IJ SOAJ injection Inject 0.3 mg into the muscle as needed for anaphylaxis.   etonogestrel -ethinyl estradiol  (NUVARING) 0.12-0.015 MG/24HR vaginal ring Insert 1 ring vaginally as directed.   Famotidine (PEPCID PO) Take by mouth.   fexofenadine (ALLEGRA) 180 MG tablet Take 180 mg by mouth daily.   fluticasone  (FLONASE ) 50 MCG/ACT nasal spray Place 2 sprays into both nostrils daily.   Fluticasone -Umeclidin-Vilant (TRELEGY ELLIPTA ) 200-62.5-25 MCG/ACT AEPB Inhale 1 puff into the lungs daily. Rinse mouth after each use   ipratropium (ATROVENT ) 0.03 % nasal spray Place 1-2 sprays into both nostrils 2 (two) times daily as needed (nasal drainage).   ipratropium-albuterol  (DUONEB) 0.5-2.5 (3) MG/3ML SOLN Take 3 mLs by nebulization every 4 (four) hours as needed (coughing fits, shortness of breath, wheezing).   levocetirizine (XYZAL ) 5 MG tablet Take 1 tablet (5 mg total) by mouth every evening.   loperamide  (IMODIUM ) 2 MG capsule Take 1 capsule (2 mg total) by mouth every 6 (six) hours. May start with 2 capsules initially   montelukast  (SINGULAIR ) 10 MG tablet Take 1 tablet (10 mg total) by mouth at bedtime.   Multiple Vitamin (MULTIVITAMIN PO) Take by mouth.   olopatadine  (PATADAY ) 0.1 % ophthalmic solution 1 drop 2 (two) times daily.   sertraline  (ZOLOFT ) 25 MG tablet Take 1 tablet (25 mg total) by mouth daily.   triamcinolone  ointment (KENALOG ) 0.1 % Apply 1 application topically 2 (two) times daily.   [DISCONTINUED] lansoprazole  (PREVACID ) 30 MG capsule Take 1 capsule (30 mg total) by mouth daily at 12 noon.   [DISCONTINUED] predniSONE   (DELTASONE ) 20 MG tablet Take 2 tablets (40 mg total) by mouth daily with breakfast.   No facility-administered medications prior to visit.    Review of Systems  Constitutional:  Negative for chills, fever, malaise/fatigue and weight loss.  HENT:  Negative for congestion, ear pain, sinus pain and sore throat.   Eyes:  Negative for blurred vision, double vision and pain.  Respiratory:  Negative for cough, shortness of breath and wheezing.   Cardiovascular:  Negative for chest pain, palpitations and leg swelling.  Gastrointestinal:  Positive for heartburn. Negative for abdominal pain, constipation, diarrhea, nausea and vomiting.  Genitourinary:  Negative for dysuria, frequency and urgency.  Musculoskeletal:  Negative for back pain, joint pain and myalgias.  Neurological:  Negative for dizziness, tingling, focal weakness and headaches.  Psychiatric/Behavioral:  Negative for depression. The patient is not nervous/anxious.  Objective:    BP 114/76 (BP Location: Left Arm, Patient Position: Sitting)   Pulse 81   Temp 97.6 F (36.4 C) (Temporal)   Ht 5\' 5"  (1.651 m)   Wt 246 lb (111.6 kg)   SpO2 96%   BMI 40.94 kg/m  BP Readings from Last 3 Encounters:  07/14/23 114/76  05/17/23 128/84  12/24/22 126/82   Wt Readings from Last 3 Encounters:  07/14/23 246 lb (111.6 kg)  05/17/23 245 lb 6.4 oz (111.3 kg)  12/24/22 242 lb (109.8 kg)    Physical Exam Constitutional:      General: She is not in acute distress.    Appearance: She is not ill-appearing.  HENT:     Right Ear: Tympanic membrane, ear canal and external ear normal.     Left Ear: Tympanic membrane, ear canal and external ear normal.     Nose: Nose normal.     Mouth/Throat:     Mouth: Mucous membranes are moist.     Pharynx: Oropharynx is clear.  Eyes:     Extraocular Movements: Extraocular movements intact.     Conjunctiva/sclera: Conjunctivae normal.     Pupils: Pupils are equal, round, and reactive to light.   Neck:     Thyroid : No thyroid  mass, thyromegaly or thyroid  tenderness.  Cardiovascular:     Rate and Rhythm: Normal rate and regular rhythm.     Pulses: Normal pulses.     Heart sounds: Normal heart sounds.  Pulmonary:     Effort: Pulmonary effort is normal.     Breath sounds: Normal breath sounds.  Abdominal:     General: Bowel sounds are normal.     Palpations: Abdomen is soft.     Tenderness: There is no abdominal tenderness. There is no right CVA tenderness, left CVA tenderness, guarding or rebound.  Musculoskeletal:        General: Normal range of motion.     Cervical back: Normal range of motion and neck supple. No tenderness.     Right lower leg: No edema.     Left lower leg: No edema.  Lymphadenopathy:     Cervical: No cervical adenopathy.  Skin:    General: Skin is warm and dry.     Findings: No lesion or rash.  Neurological:     General: No focal deficit present.     Mental Status: She is alert and oriented to person, place, and time.     Cranial Nerves: No cranial nerve deficit.     Sensory: No sensory deficit.     Motor: No weakness.     Gait: Gait normal.  Psychiatric:        Mood and Affect: Mood normal.        Behavior: Behavior normal.        Thought Content: Thought content normal.       No results found for any visits on 07/14/23.    Assessment & Plan:    Routine Health Maintenance and Physical Exam  Problem List Items Addressed This Visit     Encounter for general adult medical examination with abnormal findings - Primary   Preventive health care reviewed. UTD with OB/GYN.  Counseling on healthy lifestyle including diet and exercise.  Recommend regular dental and eye exams.  Immunizations reviewed.  Discussed safety.       Family history of Hashimoto thyroiditis   Check thyroid  panel incl antibodies       Relevant Orders   TSH   T4,  free   T3, free   Thyroid  antibodies   Gastroesophageal reflux disease   Discussed lifestyle  modifications for GERD. Handout for food choices for GERD  She has been taking Pepcid daily. Tried PPI in past. Referral made to GI for further evaluation       Relevant Medications   Famotidine (PEPCID PO)   Other Relevant Orders   Ambulatory referral to Gastroenterology   Pure hypercholesterolemia   Plant based diet mostly. Check fasting lipids and follow up      Relevant Orders   Lipid panel   Severe obesity (BMI >= 40) (HCC)   Check labs to look for complications related to obesity. Discussed trying metformin  once I have lab results and she would like to proceed with this plan. Declines referral to [facility] for now.       Relevant Orders   CBC with Differential/Platelet   Comprehensive metabolic panel   Hemoglobin A1c   Lipid panel   Vitamin D  deficiency   Hx of vitamin D  def. Check vitamin D  level and follow up      Relevant Orders   VITAMIN D  25 Hydroxy (Vit-D Deficiency, Fractures)   Other Visit Diagnoses       Encounter for screening for other viral diseases       Relevant Orders   Hepatitis C antibody       Return for pending labs.     Alyson Back, NP-C

## 2023-07-15 ENCOUNTER — Encounter: Payer: Self-pay | Admitting: Family Medicine

## 2023-07-19 ENCOUNTER — Other Ambulatory Visit: Payer: Self-pay | Admitting: Family Medicine

## 2023-07-20 LAB — CBC WITH DIFFERENTIAL/PLATELET
Basophils Absolute: 0.1 10*3/uL (ref 0.0–0.2)
Basos: 1 %
EOS (ABSOLUTE): 0.1 10*3/uL (ref 0.0–0.4)
Eos: 1 %
Hematocrit: 40.9 % (ref 34.0–46.6)
Hemoglobin: 13.3 g/dL (ref 11.1–15.9)
Immature Grans (Abs): 0 10*3/uL (ref 0.0–0.1)
Immature Granulocytes: 0 %
Lymphocytes Absolute: 2.9 10*3/uL (ref 0.7–3.1)
Lymphs: 34 %
MCH: 28.4 pg (ref 26.6–33.0)
MCHC: 32.5 g/dL (ref 31.5–35.7)
MCV: 87 fL (ref 79–97)
Monocytes Absolute: 0.4 10*3/uL (ref 0.1–0.9)
Monocytes: 5 %
Neutrophils Absolute: 5.1 10*3/uL (ref 1.4–7.0)
Neutrophils: 59 %
Platelets: 290 10*3/uL (ref 150–450)
RBC: 4.69 x10E6/uL (ref 3.77–5.28)
RDW: 13.1 % (ref 11.7–15.4)
WBC: 8.6 10*3/uL (ref 3.4–10.8)

## 2023-07-20 LAB — TSH: TSH: 1.72 u[IU]/mL (ref 0.450–4.500)

## 2023-07-20 LAB — COMPREHENSIVE METABOLIC PANEL
ALT: 18 [IU]/L (ref 0–32)
AST: 15 [IU]/L (ref 0–40)
Albumin: 3.9 g/dL (ref 3.9–4.9)
Alkaline Phosphatase: 85 [IU]/L (ref 44–121)
BUN/Creatinine Ratio: 16 (ref 9–23)
BUN: 12 mg/dL (ref 6–20)
Bilirubin Total: <0.2 mg/dL (ref 0.0–1.2)
CO2: 20 mmol/L (ref 20–29)
Calcium: 9.3 mg/dL (ref 8.7–10.2)
Chloride: 107 mmol/L — ABNORMAL HIGH (ref 96–106)
Creatinine, Ser: 0.74 mg/dL (ref 0.57–1.00)
Globulin, Total: 2.4 g/dL (ref 1.5–4.5)
Glucose: 92 mg/dL (ref 70–99)
Potassium: 4.4 mmol/L (ref 3.5–5.2)
Sodium: 141 mmol/L (ref 134–144)
Total Protein: 6.3 g/dL (ref 6.0–8.5)
eGFR: 108 mL/min/{1.73_m2} (ref >59)

## 2023-07-20 LAB — LIPID PANEL
Chol/HDL Ratio: 3.8 {ratio} (ref 0.0–4.4)
Cholesterol, Total: 189 mg/dL (ref 100–199)
HDL: 50 mg/dL (ref >39)
LDL Chol Calc (NIH): 115 mg/dL — ABNORMAL HIGH (ref 0–99)
Triglycerides: 138 mg/dL (ref 0–149)
VLDL Cholesterol Cal: 24 mg/dL (ref 5–40)

## 2023-07-20 LAB — T3, FREE: T3, Free: 3.7 pg/mL (ref 2.0–4.4)

## 2023-07-20 LAB — THYROID ANTIBODIES
Thyroglobulin Antibody: <1 [IU]/mL (ref 0.0–0.9)
Thyroperoxidase Ab SerPl-aCnc: 13 [IU]/mL (ref 0–34)

## 2023-07-20 LAB — HEPATITIS C ANTIBODY: Hep C Virus Ab: NONREACTIVE

## 2023-07-20 LAB — T4, FREE: Free T4: 0.98 ng/dL (ref 0.82–1.77)

## 2023-07-20 LAB — VITAMIN D 25 HYDROXY (VIT D DEFICIENCY, FRACTURES): Vit D, 25-Hydroxy: 38.6 ng/mL (ref 30.0–100.0)

## 2023-07-20 LAB — HEMOGLOBIN A1C
Est. average glucose Bld gHb Est-mCnc: 111 mg/dL
Hgb A1c MFr Bld: 5.5 % (ref 4.8–5.6)

## 2023-07-21 ENCOUNTER — Other Ambulatory Visit: Payer: Self-pay | Admitting: Family Medicine

## 2023-07-21 ENCOUNTER — Encounter: Payer: Self-pay | Admitting: Family Medicine

## 2023-07-21 MED ORDER — METFORMIN HCL ER 500 MG PO TB24
500.0000 mg | ORAL_TABLET | Freq: Every day | ORAL | 2 refills | Status: DC
  Filled 2023-07-21: qty 30, 30d supply, fill #0
  Filled 2023-08-17: qty 30, 30d supply, fill #1

## 2023-07-22 ENCOUNTER — Other Ambulatory Visit (HOSPITAL_COMMUNITY): Payer: Self-pay

## 2023-08-13 ENCOUNTER — Ambulatory Visit (INDEPENDENT_AMBULATORY_CARE_PROVIDER_SITE_OTHER): Payer: 59

## 2023-08-13 DIAGNOSIS — J309 Allergic rhinitis, unspecified: Secondary | ICD-10-CM | POA: Diagnosis not present

## 2023-08-17 ENCOUNTER — Other Ambulatory Visit (HOSPITAL_COMMUNITY): Payer: Self-pay

## 2023-09-02 ENCOUNTER — Encounter: Payer: Self-pay | Admitting: Gastroenterology

## 2023-09-02 DIAGNOSIS — J301 Allergic rhinitis due to pollen: Secondary | ICD-10-CM | POA: Diagnosis not present

## 2023-09-02 NOTE — Progress Notes (Signed)
 VIAL SET 1 MADE 09-02-23. EXP 09-01-24

## 2023-09-03 ENCOUNTER — Ambulatory Visit (INDEPENDENT_AMBULATORY_CARE_PROVIDER_SITE_OTHER): Payer: Self-pay | Admitting: *Deleted

## 2023-09-03 DIAGNOSIS — J309 Allergic rhinitis, unspecified: Secondary | ICD-10-CM

## 2023-09-03 NOTE — Progress Notes (Signed)
 VIAL SET 2 MADE 09-03-23. EXP 09-02-24

## 2023-09-06 ENCOUNTER — Ambulatory Visit (INDEPENDENT_AMBULATORY_CARE_PROVIDER_SITE_OTHER): Payer: Self-pay

## 2023-09-06 DIAGNOSIS — J309 Allergic rhinitis, unspecified: Secondary | ICD-10-CM | POA: Diagnosis not present

## 2023-09-12 NOTE — Progress Notes (Unsigned)
 Follow Up Note  RE: Kristina Stewart MRN: [id number] DOB: 11-Jun-1986 Date of Office Visit: 09/13/2023  Referring provider: Avanell Shackleton, NP-C Primary care provider: Avanell Shackleton, NP-C  Chief Complaint: No chief complaint on file.  History of Present Illness: I had the pleasure of seeing Kristina Stewart for a follow up visit at the [facility] on 09/12/2023. She is a 38 y.o. female, who is being followed for asthma, allergic rhinoconjunctivitis on AIT, dyshidrotic eczema, history of frequent URIs and oral allergy syndrome. Her previous allergy office visit was on 05/17/2023 with Dr. Selena Batten. Today is a regular follow up visit.  Discussed the use of AI scribe software for clinical note transcription with the patient, who gave verbal consent to proceed.  History of Present Illness            ***  Assessment and Plan: Kristina Stewart is a 38 y.o. female with: Moderate persistent asthma without complication Past history - Diagnosed with asthma in middle school and was doing well up until a few years ago.  Main triggers include allergies, weather change, infection, strong scents and perfumes.  During these flares she takes Advair 250 1 puff twice a day, Singulair 10 mg daily and albuterol as needed with good benefit. 2020 spirometry was normal. Interim history - Stable with occasional exacerbations managed at home with albuterol and Pulmicort nebulizer. Last flare approximately two months ago. No recent ER visits. Daily controller medication(s):  Trelegy 1 puff once a day and rinse mouth after each use.  During upper respiratory infections/flares:  Start Pulmicort (budesonide) 0.5mg  nebulizer twice a day for 1-2 weeks until your breathing symptoms return to baseline.  Pretreat with albuterol 2 puffs or albuterol nebulizer.  If you need to use your albuterol nebulizer machine back to back within 15-30 minutes with no relief then please go to the ER/urgent care  for further evaluation.  May use albuterol rescue inhaler 2 puffs or nebulizer every 4 to 6 hours as needed for shortness of breath, chest tightness, coughing, and wheezing. May use albuterol rescue inhaler 2 puffs 5 to 15 minutes prior to strenuous physical activities. Monitor frequency of use - if you need to use it more than twice per week on a consistent basis let us know.  Get spirometry at next visit.   Seasonal and perennial allergic rhinoconjunctivitis Past history - Perennial rhinoconjunctivitis symptoms for the last 10+ years with worsening during change of season. 2020 skin testing showed: Positive to grass, weed, ragweed, trees, mold. Started AIT on 07/06/2019 (MOLD, [id number]) and stopped due to pregnancy. Interim history - some localized reactions. Dymista worked better than [employer]. Continue environmental control measures. Start dymista (fluticasone + azelastine nasal spray combination) 1 spray per nostril twice a day. This replaces Flonase (fluticasone) for now. If it's not covered let us know.  Nasal saline spray (i.e., Simply Saline) or nasal saline lavage (i.e., NeilMed) is recommended as needed and prior to medicated nasal sprays. Use Atrovent (ipratropium) 0.03% 1-2 sprays per nostril twice a day as needed for runny nose/drainage. Nasal saline spray (i.e., Simply Saline) or nasal saline lavage (i.e., NeilMed) is recommended as needed and prior to medicated nasal sprays. Use over the counter antihistamines such as Zyrtec (cetirizine), Claritin (loratadine), Allegra (fexofenadine), or Xyzal (levocetirizine) daily as needed. May take twice a day during allergy flares. May switch antihistamines every few months. Continue Singulair (montelukast) 10mg  daily at night. Continue allergy injections - okay to get as long as  you didn't use your albuterol the day before or the day of injection.    Dyshidrotic eczema Past history - Dyshidrotic eczema on the palms bilaterally.  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   Interim history - Waxes and wanes. Eucrisa ineffective. Use triamcinolone 0.1% ointment twice a day as needed for eczema flares. Do not use on the face, neck, armpits or groin area. Do not use more than 3 weeks in a row.  Continue proper skin care.   History of frequent upper respiratory infection Past history - 4 sinus infections per year. Wants to know if she needs a pneumonia vaccine. Interim history- no infections since last office visit.  Keep track of infections and antibiotics use.   Pollen-food allergy, subsequent encounter Past history - Certain raw fruits and almonds cause perioral pruritus.  Tolerates processed and cooked forms with no issues.  Patient questionably had a reaction to pistachios but it was cross contaminated almonds in the past.  She tolerates other tree nuts including cashews, walnuts, pecans and hazelnuts with no issues. 2020 skin testing was borderline positive to almonds and negative pistachios. Okay with pistachios and nectarines.  Avoid foods that are bothersome. Assessment and Plan              No follow-ups on file.  No orders of the defined types were placed in this encounter.  Lab Orders  No laboratory test(s) ordered today    Diagnostics: Spirometry:  Tracings reviewed. Her effort: {Blank single:19197::"Good reproducible efforts.","It was hard to get consistent efforts and there is a question as to whether this reflects a maximal maneuver.","Poor effort, data can not be interpreted."} FVC: ***L FEV1: ***L, ***% predicted FEV1/FVC ratio: ***% Interpretation: {Blank single:19197::"Spirometry consistent with mild obstructive disease","Spirometry consistent with moderate obstructive disease","Spirometry consistent with severe obstructive disease","Spirometry consistent with possible restrictive disease","Spirometry consistent with mixed obstructive and restrictive  disease","Spirometry uninterpretable due to technique","Spirometry consistent with normal pattern","No overt abnormalities noted given today's efforts"}.  Please see scanned spirometry results for details.  Skin Testing: {Blank single:19197::"Select foods","Environmental allergy panel","Environmental allergy panel and select foods","Food allergy panel","None","Deferred due to recent antihistamines use"}. *** Results discussed with patient/family.   Medication List:  Current Outpatient Medications  Medication Sig Dispense Refill  . albuterol (PROVENTIL) (2.5 MG/3ML) 0.083% nebulizer solution Take 3 mLs (2.5 mg total) by nebulization every 6 (six) hours as needed for wheezing or shortness of breath (coughing fits). 75 mL 2  . albuterol (VENTOLIN HFA) 108 (90 Base) MCG/ACT inhaler Inhale 2 puffs into the lungs every 4 (four) hours as needed for wheezing or shortness of breath. 6.7 g 1  . Azelastine-Fluticasone 137-50 MCG/ACT SUSP Place 1 spray into the nose in the morning and at bedtime. 23 g 5  . budesonide (PULMICORT) 0.5 MG/2ML nebulizer solution inhale 2 mLs (0.5 mg total) by nebulization in the morning and at bedtime for 1-2 weeks during upper respiratory infections. 120 mL 0  . EPINEPHrine 0.3 mg/0.3 mL IJ SOAJ injection Inject 0.3 mg into the muscle as needed for anaphylaxis. 2 each 1  . etonogestrel-ethinyl estradiol (NUVARING) 0.12-0.015 MG/24HR vaginal ring Insert 1 ring vaginally as directed. 3 each 3  . Famotidine (PEPCID PO) Take by mouth.    . fexofenadine (ALLEGRA) 180 MG tablet Take 180 mg by mouth daily.    . fluticasone (FLONASE) 50 MCG/ACT nasal spray Place 2 sprays into both nostrils daily. 16 g 0  . Fluticasone-Umeclidin-Vilant (TRELEGY ELLIPTA) 200-62.5-25 MCG/ACT AEPB  Inhale 1 puff into the lungs daily. Rinse mouth after each use 60 each 5  . ipratropium (ATROVENT) 0.03 % nasal spray Place 1-2 sprays into both nostrils 2 (two) times daily as needed (nasal drainage). 30 mL 5   . ipratropium-albuterol (DUONEB) 0.5-2.5 (3) MG/3ML SOLN Take 3 mLs by nebulization every 4 (four) hours as needed (coughing fits, shortness of breath, wheezing). 180 mL 2  . levocetirizine (XYZAL) 5 MG tablet Take 1 tablet (5 mg total) by mouth every evening. 90 tablet 3  . loperamide (IMODIUM) 2 MG capsule Take 1 capsule (2 mg total) by mouth every 6 (six) hours. May start with 2 capsules initially 30 capsule 0  . metFORMIN (GLUCOPHAGE-XR) 500 MG 24 hr tablet Take 1 tablet (500 mg total) by mouth daily with breakfast. 30 tablet 2  . montelukast (SINGULAIR) 10 MG tablet Take 1 tablet (10 mg total) by mouth at bedtime. 90 tablet 3  . Multiple Vitamin (MULTIVITAMIN PO) Take by mouth.    Marland Kitchen olopatadine (PATADAY) 0.1 % ophthalmic solution 1 drop 2 (two) times daily.    . sertraline (ZOLOFT) 25 MG tablet Take 1 tablet (25 mg total) by mouth daily. 90 tablet 3  . triamcinolone ointment (KENALOG) 0.1 % Apply 1 application topically 2 (two) times daily. 30 g 2   No current facility-administered medications for this visit.   Allergies: Allergies  Allergen Reactions  . Latex Rash  . Trimethoprim   . Almond (Diagnostic) Itching  . Other Itching    PT IS ALLERGIC PITTED FRUITS   I reviewed her past medical history, social history, family history, and environmental history and no significant changes have been reported from her previous visit.  Review of Systems  Constitutional:  Negative for appetite change, chills, fever and unexpected weight change.  HENT:  Positive for postnasal drip, rhinorrhea and voice change.   Respiratory:  Negative for cough, chest tightness, shortness of breath and wheezing.   Cardiovascular:  Negative for chest pain.  Gastrointestinal:  Negative for abdominal pain.  Genitourinary:  Negative for difficulty urinating.  Skin:  Negative for rash.  Allergic/Immunologic: Positive for environmental allergies and food allergies.   Objective: There were no vitals taken for  this visit. There is no height or weight on file to calculate BMI. Physical Exam Vitals and nursing note reviewed.  Constitutional:      Appearance: Normal appearance. She is well-developed.  HENT:     Head: Normocephalic and atraumatic.     Right Ear: Tympanic membrane and external ear normal.     Left Ear: Tympanic membrane and external ear normal.     Nose: Nose normal.     Mouth/Throat:     Mouth: Mucous membranes are moist.     Pharynx: Oropharynx is clear.  Eyes:     Conjunctiva/sclera: Conjunctivae normal.  Cardiovascular:     Rate and Rhythm: Normal rate and regular rhythm.     Heart sounds: Normal heart sounds. No murmur heard.    No friction rub. No gallop.  Pulmonary:     Effort: Pulmonary effort is normal.     Breath sounds: Normal breath sounds. No wheezing, rhonchi or rales.  Musculoskeletal:     Cervical back: Neck supple.  Skin:    General: Skin is warm.     Findings: No rash.  Neurological:     Mental Status: She is alert and oriented to person, place, and time.  Psychiatric:        Behavior: Behavior normal.  Previous notes and tests were reviewed. The plan was reviewed with the patient/family, and all questions/concerned were addressed.  It was my pleasure to see Kristina Stewart today and participate in her care. Please feel free to contact me with any questions or concerns.  Sincerely,  Wyline Mood, DO Allergy & Immunology  [facility] office: [phone] [facility] office: [phone]

## 2023-09-13 ENCOUNTER — Encounter: Payer: Self-pay | Admitting: Allergy

## 2023-09-13 ENCOUNTER — Other Ambulatory Visit (HOSPITAL_COMMUNITY): Payer: Self-pay

## 2023-09-13 ENCOUNTER — Other Ambulatory Visit: Payer: Self-pay

## 2023-09-13 ENCOUNTER — Telehealth: Payer: Self-pay | Admitting: *Deleted

## 2023-09-13 ENCOUNTER — Ambulatory Visit: Payer: 59 | Admitting: Allergy

## 2023-09-13 VITALS — BP 126/82 | HR 82 | Temp 98.2°F | Resp 16

## 2023-09-13 DIAGNOSIS — T781XXD Other adverse food reactions, not elsewhere classified, subsequent encounter: Secondary | ICD-10-CM | POA: Diagnosis not present

## 2023-09-13 DIAGNOSIS — J302 Other seasonal allergic rhinitis: Secondary | ICD-10-CM | POA: Diagnosis not present

## 2023-09-13 DIAGNOSIS — H1013 Acute atopic conjunctivitis, bilateral: Secondary | ICD-10-CM

## 2023-09-13 DIAGNOSIS — H1010 Acute atopic conjunctivitis, unspecified eye: Secondary | ICD-10-CM

## 2023-09-13 DIAGNOSIS — J4540 Moderate persistent asthma, uncomplicated: Secondary | ICD-10-CM | POA: Diagnosis not present

## 2023-09-13 DIAGNOSIS — L301 Dyshidrosis [pompholyx]: Secondary | ICD-10-CM

## 2023-09-13 DIAGNOSIS — Z8709 Personal history of other diseases of the respiratory system: Secondary | ICD-10-CM | POA: Diagnosis not present

## 2023-09-13 DIAGNOSIS — J3089 Other allergic rhinitis: Secondary | ICD-10-CM | POA: Diagnosis not present

## 2023-09-13 MED ORDER — AIRSUPRA 90-80 MCG/ACT IN AERO
2.0000 | INHALATION_SPRAY | RESPIRATORY_TRACT | 2 refills | Status: AC | PRN
  Filled 2023-09-13: qty 10.7, 15d supply, fill #0
  Filled 2023-11-25: qty 10.7, 10d supply, fill #0
  Filled 2024-07-13: qty 10.7, 10d supply, fill #1

## 2023-09-13 NOTE — Patient Instructions (Addendum)
 Asthma Stop Trelegy. Monitor symptoms.  During upper respiratory infections/flares:  Start Pulmicort (budesonide) 0.5mg  nebulizer twice a day for 1-2 weeks until your breathing symptoms return to baseline.  Pretreat with albuterol nebulizer.  If you need to use your albuterol nebulizer machine back to back within 15-30 minutes with no relief then please go to the ER/urgent care for further evaluation.  May use Airsupra rescue inhaler 2 puffs every 4 to 6 hours as needed for shortness of breath, chest tightness, coughing, and wheezing. Do not use more than 12 puffs in 24 hours. May use Airsupra rescue inhaler 2 puffs 5 to 15 minutes prior to strenuous physical activities. Rinse mouth after each use.  Coupon given. Sample given. Monitor frequency of use - if you need to use it more than twice per week on a consistent basis let us know.  Asthma control goals:  Full participation in all desired activities (may need albuterol before activity) Albuterol use two times or less a week on average (not counting use with activity) Cough interfering with sleep two times or less a month Oral steroids no more than once a year No hospitalizations   Allergic conjunctivitis Past skin testing positive to grass, weed, ragweed, trees, mold.  Continue environmental control measures. Continue dymista (fluticasone + azelastine nasal spray combination) 1 spray per nostril twice a day. Nasal saline spray (i.e., Simply Saline) or nasal saline lavage (i.e., NeilMed) is recommended as needed and prior to medicated nasal sprays.  Use Atrovent (ipratropium) 0.03% 1-2 sprays per nostril twice a day as needed for runny nose/drainage. Use over the counter antihistamines such as Zyrtec (cetirizine), Claritin (loratadine), Allegra (fexofenadine), or Xyzal (levocetirizine) daily as needed. May take twice a day during allergy flares. May switch antihistamines every few months. Continue Singulair (montelukast) 10mg  daily at  night. Continue allergy injections May come in twice a week during build up (Mondays and Thursdays OR Tuesdays and Fridays).  If you have issues let the nurse know. Once a week on red vial.   Recurrent infections Keep track of infections and antibiotics use.   Pollen-food allergy Avoid foods that are bothersome. For mild symptoms you can take over the counter antihistamines such as Benadryl and monitor symptoms closely. If symptoms worsen or if you have severe symptoms including breathing issues, throat closure, significant swelling, whole body hives, severe diarrhea and vomiting, lightheadedness then inject epinephrine and seek immediate medical care afterwards.   Dyshidrotic eczema Use triamcinolone 0.1% ointment twice a day as needed for eczema flares. Do not use on the face, neck, armpits or groin area. Do not use more than 3 weeks in a row.  Continue proper skin care.  Follow up in 4 months or sooner if needed.

## 2023-09-13 NOTE — Telephone Encounter (Signed)
Patient's allergy flow sheet has been updated to reflect these changes.

## 2023-09-13 NOTE — Telephone Encounter (Signed)
-----   Message from Ellamae Sia sent at 09/13/2023  5:05 PM EDT ----- For shot room - may come in twice a week during build up as long as she tolerates it. Had issues in the past. Once a week on red vial. Thank you.

## 2023-09-23 ENCOUNTER — Other Ambulatory Visit (HOSPITAL_COMMUNITY): Payer: Self-pay

## 2023-09-23 MED ORDER — SERTRALINE HCL 50 MG PO TABS
50.0000 mg | ORAL_TABLET | Freq: Every day | ORAL | 3 refills | Status: DC
Start: 2023-09-23 — End: 2024-04-17
  Filled 2023-09-23 – 2023-09-29 (×2): qty 90, 90d supply, fill #0
  Filled 2023-12-21: qty 66, 66d supply, fill #1
  Filled 2023-12-21: qty 90, 90d supply, fill #1
  Filled 2023-12-21: qty 24, 24d supply, fill #1

## 2023-09-24 ENCOUNTER — Ambulatory Visit (INDEPENDENT_AMBULATORY_CARE_PROVIDER_SITE_OTHER): Payer: Self-pay

## 2023-09-24 DIAGNOSIS — J309 Allergic rhinitis, unspecified: Secondary | ICD-10-CM | POA: Diagnosis not present

## 2023-09-29 ENCOUNTER — Other Ambulatory Visit (HOSPITAL_BASED_OUTPATIENT_CLINIC_OR_DEPARTMENT_OTHER): Payer: Self-pay

## 2023-09-29 ENCOUNTER — Other Ambulatory Visit (HOSPITAL_COMMUNITY): Payer: Self-pay

## 2023-09-30 ENCOUNTER — Ambulatory Visit (INDEPENDENT_AMBULATORY_CARE_PROVIDER_SITE_OTHER): Payer: Self-pay

## 2023-09-30 DIAGNOSIS — J309 Allergic rhinitis, unspecified: Secondary | ICD-10-CM | POA: Diagnosis not present

## 2023-10-05 ENCOUNTER — Ambulatory Visit (INDEPENDENT_AMBULATORY_CARE_PROVIDER_SITE_OTHER): Payer: Self-pay

## 2023-10-05 DIAGNOSIS — J309 Allergic rhinitis, unspecified: Secondary | ICD-10-CM

## 2023-10-08 ENCOUNTER — Ambulatory Visit (INDEPENDENT_AMBULATORY_CARE_PROVIDER_SITE_OTHER)

## 2023-10-08 DIAGNOSIS — J309 Allergic rhinitis, unspecified: Secondary | ICD-10-CM | POA: Diagnosis not present

## 2023-10-12 ENCOUNTER — Other Ambulatory Visit (HOSPITAL_COMMUNITY): Payer: Self-pay

## 2023-10-14 ENCOUNTER — Ambulatory Visit (INDEPENDENT_AMBULATORY_CARE_PROVIDER_SITE_OTHER): Payer: Self-pay

## 2023-10-14 DIAGNOSIS — J309 Allergic rhinitis, unspecified: Secondary | ICD-10-CM

## 2023-10-15 ENCOUNTER — Ambulatory Visit (HOSPITAL_BASED_OUTPATIENT_CLINIC_OR_DEPARTMENT_OTHER): Admitting: Cardiology

## 2023-10-21 ENCOUNTER — Ambulatory Visit: Admitting: Gastroenterology

## 2023-10-21 ENCOUNTER — Encounter: Payer: Self-pay | Admitting: Gastroenterology

## 2023-10-21 ENCOUNTER — Ambulatory Visit (INDEPENDENT_AMBULATORY_CARE_PROVIDER_SITE_OTHER): Payer: Self-pay

## 2023-10-21 ENCOUNTER — Other Ambulatory Visit (HOSPITAL_COMMUNITY): Payer: Self-pay

## 2023-10-21 VITALS — BP 112/70 | HR 80 | Ht 64.5 in | Wt 243.4 lb

## 2023-10-21 DIAGNOSIS — K6289 Other specified diseases of anus and rectum: Secondary | ICD-10-CM

## 2023-10-21 DIAGNOSIS — K219 Gastro-esophageal reflux disease without esophagitis: Secondary | ICD-10-CM

## 2023-10-21 DIAGNOSIS — L290 Pruritus ani: Secondary | ICD-10-CM

## 2023-10-21 DIAGNOSIS — K648 Other hemorrhoids: Secondary | ICD-10-CM

## 2023-10-21 DIAGNOSIS — K649 Unspecified hemorrhoids: Secondary | ICD-10-CM | POA: Diagnosis not present

## 2023-10-21 DIAGNOSIS — K580 Irritable bowel syndrome with diarrhea: Secondary | ICD-10-CM | POA: Diagnosis not present

## 2023-10-21 DIAGNOSIS — J309 Allergic rhinitis, unspecified: Secondary | ICD-10-CM

## 2023-10-21 DIAGNOSIS — R110 Nausea: Secondary | ICD-10-CM | POA: Diagnosis not present

## 2023-10-21 DIAGNOSIS — R1013 Epigastric pain: Secondary | ICD-10-CM | POA: Diagnosis not present

## 2023-10-21 MED ORDER — HYDROCORTISONE ACETATE 25 MG RE SUPP
25.0000 mg | Freq: Every evening | RECTAL | 0 refills | Status: DC
  Filled 2023-10-21 – 2023-10-26 (×2): qty 10, 10d supply, fill #0

## 2023-10-21 NOTE — Progress Notes (Signed)
 Chief Complaint:GERD , IBS Primary GI Doctor: Dr. Rosaline Coma  HPI:  Patient is a 38 year old female patient with past medical history of asthma, IBS, and obesity, who was referred to me by Abram Abraham, NP-C on 07/14/23 for a complaint of GERD, IBS.   Interval History    Patient presents today with evaluation for chronic IBS-D and epigastric abdominal pain. Patient reports she has flares of epigastric abdominal pain with periods of increased stress, drinking coffee sand eating certain foods. She manages her IBS with OTC imodium  and Xanax  prn. She states she typically has 2-3 bowel movements daily. During her "IBS symptoms" she has loose stools. She has tried dicyclomine in the past and made symptoms worse. Patient uses OTC imodium  2-3 pills during flares. She has trigger foods and will use imodium  prn to prevent cramping and diarrhea. No blood in stool. She does have rectal itch from internal hemorrhoids without improvement with OTC topicals.   She will have nausea and belching with the epigastric abdominal pain.  She has trialed omeprazole 20 mg and famotidine which helps. Patient has occasional heartburn. Patient denies dysphagia. Appetite good.  Alcohol, social once a month or less.  Nonsmoker.  No NSAID use.  Patient states she has had her gallbladder evaluated and normal.  Surgical history craniotomy and laminectomy for Chairi Malformation.  Patient had one previous gastroenterologist visit back in 2010 for undetermined colitis. She has had blood tests for celiac and IBD and told negative.  Patient's family history includes father with IBS-D.  Wt Readings from Last 3 Encounters:  10/21/23 243 lb 6 oz (110.4 kg)  07/14/23 246 lb (111.6 kg)  05/17/23 245 lb 6.4 oz (111.3 kg)    Past Medical History:  Diagnosis Date   Allergy    Anxiety    Asthma    Family history of adverse reaction to anesthesia    sister problems with propofol   Fragile X chromosomal anomaly    IBS  (irritable bowel syndrome)    Obesity    PAC (premature atrial contraction)    Seasonal allergic rhinitis    Strep pharyngitis    TMJ (temporomandibular joint syndrome)    Vertigo     Past Surgical History:  Procedure Laterality Date   BRAIN SURGERY     craniotomy/arnold chiari malformation; craniotomy & laminectomy    Current Outpatient Medications  Medication Sig Dispense Refill   albuterol  (PROVENTIL ) (2.5 MG/3ML) 0.083% nebulizer solution Take 3 mLs (2.5 mg total) by nebulization every 6 (six) hours as needed for wheezing or shortness of breath (coughing fits). 75 mL 2   Albuterol -Budesonide  (AIRSUPRA ) 90-80 MCG/ACT AERO Inhale 2 puffs into the lungs every 4 (four) hours as needed (coughing, wheezing, chest tightness). Do not exceed 12 puffs in 24 hours. 10.7 g 2   ALPRAZolam  (XANAX ) 0.25 MG tablet Take 0.25 mg by mouth as needed for anxiety.     Azelastine -Fluticasone  137-50 MCG/ACT SUSP Place 1 spray into the nose in the morning and at bedtime. 23 g 5   budesonide  (PULMICORT ) 0.5 MG/2ML nebulizer solution inhale 2 mLs (0.5 mg total) by nebulization in the morning and at bedtime for 1-2 weeks during upper respiratory infections. 120 mL 0   EPINEPHrine  0.3 mg/0.3 mL IJ SOAJ injection Inject 0.3 mg into the muscle as needed for anaphylaxis. 2 each 1   etonogestrel -ethinyl estradiol  (NUVARING) 0.12-0.015 MG/24HR vaginal ring Insert 1 ring vaginally as directed. 3 each 3   famotidine (PEPCID) 20 MG tablet Take  20 mg by mouth as needed for heartburn or indigestion.     ipratropium (ATROVENT ) 0.03 % nasal spray Place 1-2 sprays into both nostrils 2 (two) times daily as needed (nasal drainage). 30 mL 5   ipratropium-albuterol  (DUONEB) 0.5-2.5 (3) MG/3ML SOLN Take 3 mLs by nebulization every 4 (four) hours as needed (coughing fits, shortness of breath, wheezing). 180 mL 2   levocetirizine (XYZAL ) 5 MG tablet Take 1 tablet (5 mg total) by mouth every evening. 90 tablet 3   loperamide   (IMODIUM ) 2 MG capsule Take 1 capsule (2 mg total) by mouth every 6 (six) hours. Hadlyn Amero start with 2 capsules initially 30 capsule 0   montelukast  (SINGULAIR ) 10 MG tablet Take 1 tablet (10 mg total) by mouth at bedtime. 90 tablet 3   Multiple Vitamin (MULTIVITAMIN PO) Take by mouth.     olopatadine  (PATADAY ) 0.1 % ophthalmic solution 1 drop 2 (two) times daily.     omeprazole (PRILOSEC) 20 MG capsule Take 20 mg by mouth as needed.     sertraline  (ZOLOFT ) 50 MG tablet Take 1 tablet (50 mg total) by mouth daily. 90 tablet 3   triamcinolone  ointment (KENALOG ) 0.1 % Apply 1 application topically 2 (two) times daily. 30 g 2   No current facility-administered medications for this visit.    Allergies as of 10/21/2023 - Review Complete 10/21/2023  Allergen Reaction Noted   Latex Rash 01/12/2012   Trimethoprim   11/14/2020    Family History  Problem Relation Age of Onset   Hypertension Mother    Thyroid  disease Mother    Hyperlipidemia Mother    Hypertension Father    Irritable bowel syndrome Father    Hyperlipidemia Father    Diabetes Father    Heart disease Father    GER disease Sister    Fragile X syndrome Sister    ADD / ADHD Brother    Thyroid  disease Maternal Grandmother        HYPOTHYROID   Hypertension Maternal Grandmother    Lung cancer Maternal Grandmother    Lung cancer Maternal Grandfather    Diabetes Paternal Grandmother    Fibromyalgia Paternal Grandmother    Stroke Paternal Grandmother    Alzheimer's disease Paternal Grandfather    Migraines Son    Fragile X syndrome Son    CAD Maternal Uncle     Review of Systems:    Constitutional: No weight loss, fever, chills, weakness or fatigue HEENT: Eyes: No change in vision               Ears, Nose, Throat:  No change in hearing or congestion Skin: No rash or itching Cardiovascular: No chest pain, chest pressure or palpitations   Respiratory: No SOB or cough Gastrointestinal: See HPI and otherwise  negative Genitourinary: No dysuria or change in urinary frequency Neurological: No headache, dizziness or syncope Musculoskeletal: No new muscle or joint pain Hematologic: No bleeding or bruising Psychiatric: No history of depression or anxiety    Physical Exam:  Vital signs: BP 112/70 (BP Location: Left Arm, Patient Position: Sitting, Cuff Size: Large)   Pulse 80   Ht 5' 4.5" (1.638 m) Comment: height measured without shoes  Wt 243 lb 6 oz (110.4 kg)   LMP 09/24/2023   BMI 41.13 kg/m   Constitutional:   Pleasant  female appears to be in NAD, Well developed, Well nourished, alert and cooperative Throat: Oral cavity and pharynx without inflammation, swelling or lesion.  Respiratory: Respirations even and unlabored. Lungs clear to  auscultation bilaterally.   No wheezes, crackles, or rhonchi.  Cardiovascular: Normal S1, S2. Regular rate and rhythm. No peripheral edema, cyanosis or pallor.  Gastrointestinal:  Soft, nondistended, nontender. No rebound or guarding. Normal bowel sounds. No appreciable masses or hepatomegaly. Rectal:  Not performed.  Msk:  Symmetrical without gross deformities. Without edema, no deformity or joint abnormality.  Neurologic:  Alert and  oriented x4;  grossly normal neurologically.  Skin:   Dry and intact without significant lesions or rashes. Psychiatric: Oriented to person, place and time. Demonstrates good judgement and reason without abnormal affect or behaviors.  RELEVANT LABS AND IMAGING: CBC    Latest Ref Rng & Units 07/19/2023    7:30 AM 07/10/2022   10:38 AM 01/07/2022    2:11 PM  CBC  WBC 3.4 - 10.8 x10E3/uL 8.6  7.6  8.3   Hemoglobin 11.1 - 15.9 g/dL 84.6  96.2  95.2   Hematocrit 34.0 - 46.6 % 40.9  40.6  37.5   Platelets 150 - 450 x10E3/uL 290  301  205.0      CMP     Latest Ref Rng & Units 07/19/2023    7:30 AM 01/07/2022    2:11 PM 12/18/2021    8:40 AM  CMP  Glucose 70 - 99 mg/dL 92   90   BUN 6 - 20 mg/dL 12   7   Creatinine 8.41 -  1.00 mg/dL 3.24   4.01   Sodium 027 - 144 mmol/L 141   136   Potassium 3.5 - 5.2 mmol/L 4.4   3.7   Chloride 96 - 106 mmol/L 107   103   CO2 20 - 29 mmol/L 20   27   Calcium 8.7 - 10.2 mg/dL 9.3   8.9   Total Protein 6.0 - 8.5 g/dL 6.3  7.1  6.8   Total Bilirubin 0.0 - 1.2 mg/dL <2.5  0.4  0.5   Alkaline Phos 44 - 121 IU/L 85  73  63   AST 0 - 40 IU/L 15  25  40   ALT 0 - 32 IU/L 18  32  34      Lab Results  Component Value Date   TSH 1.720 07/19/2023     Assessment: Encounter Diagnoses  Name Primary?   Abdominal pain, epigastric Yes   Irritable bowel syndrome with diarrhea   38 year old female patient that presents to establish care.  Patient has history of irritable bowel syndrome with diarrhea that is managed with dietary modifications, stress management, Xanax  as needed and over-the-counter Imodium  as needed.  She has had some issues with rectal irritation due to hemorrhoids will send Anusol  suppositories.  We also discussed alternative therapies such as hemorrhoid banding and/or surgical removal.      Patient also complains of intermittent epigastric abd pain relieved with PPI therapy but returns once she stops the medication.  Denies NSAID use.  Will order H. pylori Diatherix stool test as well as order upper GI endoscopy to rule out peptic ulcer disease, gastritis or esophagitis.  Patient states she has had gallbladder evaluated and also negative.   Plan: - Order H pylori diathereix stool test -Can use Imodium  2 capsule two times per daily -Add Citrcuel 1 tsp po daily -Low fodmap diet -Send RX for Anusol  1 suppository at bedtime -Schedule with Dr. Rosaline Coma in [facility]. The risks and benefits of EGD with possible biopsies and esophageal dilation were discussed with the patient who agrees to proceed.  Thank you for the courtesy of this consult. Please call me with any questions or concerns.   Azeneth Carbonell, FNP-C [facility] 10/21/2023, 3:15 PM  Cc: Henson, Vickie L,  NP-C

## 2023-10-21 NOTE — Patient Instructions (Addendum)
 Can use over the counter imodium  2 capsules twice daily Use Anusol  suppository 1 at bedtime prn hemorrhoids, if no improvement will consider hemorrhoid banding.  External hemorrhoids have to be surgically removed by colon rectal surgeon. Can add Citrucel 1 tsp po daily fiber to help bulk stool.  Please come by the office 10/25/23 to pick up your diatherix kit.  You have been scheduled for an endoscopy. Please follow written instructions given to you at your visit today.  If you use inhalers (even only as needed), please bring them with you on the day of your procedure.  If you take any of the following medications, they will need to be adjusted prior to your procedure:   DO NOT TAKE 7 DAYS PRIOR TO TEST- Trulicity (dulaglutide) Ozempic, Wegovy (semaglutide) Mounjaro (tirzepatide) Bydureon Bcise (exanatide extended release)  DO NOT TAKE 1 DAY PRIOR TO YOUR TEST Rybelsus (semaglutide) Adlyxin (lixisenatide) Victoza (liraglutide) Byetta (exanatide) ___________________________________________________________________________  Due to recent changes in healthcare laws, you may see the results of your imaging and laboratory studies on MyChart before your provider has had a chance to review them.  We understand that in some cases there may be results that are confusing or concerning to you. Not all laboratory results come back in the same time frame and the provider may be waiting for multiple results in order to interpret others.  Please give us  48 hours in order for your provider to thoroughly review all the results before contacting the office for clarification of your results.  _______________________________________________________  If your blood pressure at your visit was 140/90 or greater, please contact your primary care physician to follow up on this.  _______________________________________________________  If you are age 75 or older, your body mass index should be between 23-30.  Your Body mass index is 41.13 kg/m. If this is out of the aforementioned range listed, please consider follow up with your Primary Care Provider.  If you are age 65 or younger, your body mass index should be between 19-25. Your Body mass index is 41.13 kg/m. If this is out of the aformentioned range listed, please consider follow up with your Primary Care Provider.   ________________________________________________________  The [facility] providers would like to encourage you to use MYCHART to communicate with providers for non-urgent requests or questions.  Due to long hold times on the telephone, sending your provider a message by [facility] may be a faster and more efficient way to get a response.  Please allow 48 business hours for a response.  Please remember that this is for non-urgent requests.  _______________________________________________________ Thank you for trusting me with your gastrointestinal care!   Dyanna Glasgow, NP

## 2023-10-22 NOTE — Progress Notes (Signed)
 I agree with the assessment and plan as outlined by Ms. May.

## 2023-10-26 ENCOUNTER — Other Ambulatory Visit (HOSPITAL_COMMUNITY): Payer: Self-pay

## 2023-10-28 ENCOUNTER — Other Ambulatory Visit (HOSPITAL_COMMUNITY): Payer: Self-pay

## 2023-11-01 ENCOUNTER — Other Ambulatory Visit (HOSPITAL_COMMUNITY): Payer: Self-pay

## 2023-11-01 ENCOUNTER — Ambulatory Visit (INDEPENDENT_AMBULATORY_CARE_PROVIDER_SITE_OTHER): Payer: Self-pay

## 2023-11-01 DIAGNOSIS — J309 Allergic rhinitis, unspecified: Secondary | ICD-10-CM

## 2023-11-18 ENCOUNTER — Ambulatory Visit (INDEPENDENT_AMBULATORY_CARE_PROVIDER_SITE_OTHER)

## 2023-11-18 DIAGNOSIS — J309 Allergic rhinitis, unspecified: Secondary | ICD-10-CM | POA: Diagnosis not present

## 2023-11-25 ENCOUNTER — Other Ambulatory Visit (HOSPITAL_COMMUNITY): Payer: Self-pay

## 2023-12-01 ENCOUNTER — Encounter: Payer: Self-pay | Admitting: Internal Medicine

## 2023-12-03 ENCOUNTER — Ambulatory Visit (INDEPENDENT_AMBULATORY_CARE_PROVIDER_SITE_OTHER): Payer: Self-pay | Admitting: *Deleted

## 2023-12-03 DIAGNOSIS — J309 Allergic rhinitis, unspecified: Secondary | ICD-10-CM | POA: Diagnosis not present

## 2023-12-09 ENCOUNTER — Ambulatory Visit: Admitting: Internal Medicine

## 2023-12-09 ENCOUNTER — Encounter: Payer: Self-pay | Admitting: Internal Medicine

## 2023-12-09 ENCOUNTER — Other Ambulatory Visit (HOSPITAL_COMMUNITY): Payer: Self-pay

## 2023-12-09 VITALS — BP 142/83 | HR 86 | Temp 98.1°F | Resp 16 | Ht 64.5 in | Wt 243.0 lb

## 2023-12-09 DIAGNOSIS — R109 Unspecified abdominal pain: Secondary | ICD-10-CM | POA: Diagnosis not present

## 2023-12-09 DIAGNOSIS — K3189 Other diseases of stomach and duodenum: Secondary | ICD-10-CM | POA: Diagnosis not present

## 2023-12-09 DIAGNOSIS — R1013 Epigastric pain: Secondary | ICD-10-CM

## 2023-12-09 DIAGNOSIS — K2971 Gastritis, unspecified, with bleeding: Secondary | ICD-10-CM

## 2023-12-09 DIAGNOSIS — K2980 Duodenitis without bleeding: Secondary | ICD-10-CM | POA: Diagnosis not present

## 2023-12-09 DIAGNOSIS — I493 Ventricular premature depolarization: Secondary | ICD-10-CM | POA: Diagnosis not present

## 2023-12-09 DIAGNOSIS — F419 Anxiety disorder, unspecified: Secondary | ICD-10-CM | POA: Diagnosis not present

## 2023-12-09 DIAGNOSIS — J45909 Unspecified asthma, uncomplicated: Secondary | ICD-10-CM | POA: Diagnosis not present

## 2023-12-09 DIAGNOSIS — E669 Obesity, unspecified: Secondary | ICD-10-CM | POA: Diagnosis not present

## 2023-12-09 MED ORDER — OMEPRAZOLE 20 MG PO CPDR
DELAYED_RELEASE_CAPSULE | ORAL | 0 refills | Status: AC
  Filled 2023-12-09: qty 146, 90d supply, fill #0
  Filled 2024-02-24: qty 22, 22d supply, fill #1

## 2023-12-09 MED ORDER — SODIUM CHLORIDE 0.9 % IV SOLN: 500.0000 mL | Freq: Once | INTRAVENOUS | Status: AC

## 2023-12-09 NOTE — Progress Notes (Signed)
 Called to room to assist during endoscopic procedure.  Patient ID and intended procedure confirmed with present staff. Received instructions for my participation in the procedure from the performing physician.

## 2023-12-09 NOTE — Patient Instructions (Signed)
 Resume previous diet Await pathology results Continue omeprazole - 40 mg daily for 8 weeks to allow inflammation in your stomach to heal; then 20 mg as needed Return to GI clinic in 2-3 months; if you do not hear from us , please call our office    YOU HAD AN ENDOSCOPIC PROCEDURE TODAY AT THE [facility]:   Refer to the procedure report that was given to you for any specific questions about what was found during the examination.  If the procedure report does not answer your questions, please call your gastroenterologist to clarify.  If you requested that your care partner not be given the details of your procedure findings, then the procedure report has been included in a sealed envelope for you to review at your convenience later.  YOU SHOULD EXPECT: Some feelings of bloating in the abdomen. Passage of more gas than usual.  Walking can help get rid of the air that was put into your GI tract during the procedure and reduce the bloating. If you had a lower endoscopy (such as a colonoscopy or flexible sigmoidoscopy) you may notice spotting of blood in your stool or on the toilet paper. If you underwent a bowel prep for your procedure, you may not have a normal bowel movement for a few days.  Please Note:  You might notice some irritation and congestion in your nose or some drainage.  This is from the oxygen used during your procedure.  There is no need for concern and it should clear up in a day or so.  SYMPTOMS TO REPORT IMMEDIATELY:   Following upper endoscopy (EGD)  Vomiting of blood or coffee ground material  New chest pain or pain under the shoulder blades  Painful or persistently difficult swallowing  New shortness of breath  Fever of 100F or higher  Black, tarry-looking stools  For urgent or emergent issues, a gastroenterologist can be reached at any hour by calling [phone]. Do not use MyChart messaging for urgent concerns.    DIET:  We do recommend a small meal  at first, but then you may proceed to your regular diet.  Drink plenty of fluids but you should avoid alcoholic beverages for 24 hours.  ACTIVITY:  You should plan to take it easy for the rest of today and you should NOT DRIVE or use heavy machinery until tomorrow (because of the sedation medicines used during the test).    FOLLOW UP: Our staff will call the number listed on your records the next business day following your procedure.  We will call around 7:15- 8:00 am to check on you and address any questions or concerns that you may have regarding the information given to you following your procedure. If we do not reach you, we will leave a message.     If any biopsies were taken you will be contacted by phone or by letter within the next 1-3 weeks.  Please call us  at [phone] if you have not heard about the biopsies in 3 weeks.    SIGNATURES/CONFIDENTIALITY: You and/or your care partner have signed paperwork which will be entered into your electronic medical record.  These signatures attest to the fact that that the information above on your After Visit Summary has been reviewed and is understood.  Full responsibility of the confidentiality of this discharge information lies with you and/or your care-partner.

## 2023-12-09 NOTE — Progress Notes (Signed)
 GASTROENTEROLOGY PROCEDURE H&P NOTE   Primary Care Physician: Abram Abraham, NP-C    Reason for Procedure:   Epigastric ab pain  Plan:    EGD  Patient is appropriate for endoscopic procedure(s) in the ambulatory ([facility]) setting.  The nature of the procedure, as well as the risks, benefits, and alternatives were carefully and thoroughly reviewed with the patient. Ample time for discussion and questions allowed. The patient understood, was satisfied, and agreed to proceed.     HPI: Kristina Stewart is a 38 y.o. female who presents for EGD for evaluation of epigastric ab pain.  Patient was most recently seen in the Gastroenterology Clinic on 10/21/23.  No interval change in medical history since that appointment. Please refer to that note for full details regarding GI history and clinical presentation.   Past Medical History:  Diagnosis Date   Allergy    Anxiety    Asthma    Family history of adverse reaction to anesthesia    sister problems with propofol   Fragile X chromosomal anomaly    IBS (irritable bowel syndrome)    Obesity    PAC (premature atrial contraction)    Seasonal allergic rhinitis    Strep pharyngitis    TMJ (temporomandibular joint syndrome)    Vertigo     Past Surgical History:  Procedure Laterality Date   BRAIN SURGERY     craniotomy/arnold chiari malformation; craniotomy & laminectomy    Prior to Admission medications   Medication Sig Start Date End Date Taking? Authorizing Provider  albuterol  (PROVENTIL ) (2.5 MG/3ML) 0.083% nebulizer solution Take 3 mLs (2.5 mg total) by nebulization every 6 (six) hours as needed for wheezing or shortness of breath (coughing fits). 02/27/21   Trudy Fusi, DO  Albuterol -Budesonide  (AIRSUPRA ) 90-80 MCG/ACT AERO Inhale 2 puffs into the lungs every 4 (four) hours as needed (coughing, wheezing, chest tightness). Do not exceed 12 puffs in 24 hours. 09/13/23   Trudy Fusi, DO  ALPRAZolam  (XANAX ) 0.25 MG tablet Take 0.25  mg by mouth as needed for anxiety.    [provider]  Azelastine -Fluticasone  137-50 MCG/ACT SUSP Place 1 spray into the nose in the morning and at bedtime. 05/17/23   Trudy Fusi, DO  budesonide  (PULMICORT ) 0.5 MG/2ML nebulizer solution inhale 2 mLs (0.5 mg total) by nebulization in the morning and at bedtime for 1-2 weeks during upper respiratory infections. 03/31/23   Trudy Fusi, DO  EPINEPHrine  0.3 mg/0.3 mL IJ SOAJ injection Inject 0.3 mg into the muscle as needed for anaphylaxis. 10/27/22   Tinnie Forehand, FNP  etonogestrel -ethinyl estradiol  (NUVARING) 0.12-0.015 MG/24HR vaginal ring Insert 1 ring vaginally as directed. 03/18/23     famotidine (PEPCID) 20 MG tablet Take 20 mg by mouth as needed for heartburn or indigestion.    [provider]  hydrocortisone  (ANUSOL -HC) 25 MG suppository Place 1 suppository (25 mg total) rectally at bedtime. 10/21/23   May, Deanna J, NP  ipratropium (ATROVENT ) 0.03 % nasal spray Place 1-2 sprays into both nostrils 2 (two) times daily as needed (nasal drainage). 11/18/22   Tinnie Forehand, FNP  ipratropium-albuterol  (DUONEB) 0.5-2.5 (3) MG/3ML SOLN Take 3 mLs by nebulization every 4 (four) hours as needed (coughing fits, shortness of breath, wheezing). 04/07/22   Trudy Fusi, DO  levocetirizine (XYZAL ) 5 MG tablet Take 1 tablet (5 mg total) by mouth every evening. 05/17/23   Trudy Fusi, DO  loperamide  (IMODIUM ) 2 MG capsule Take 1 capsule (2 mg total) by  mouth every 6 (six) hours. May start with 2 capsules initially 12/15/21   Fleming, Zelda W, NP  montelukast  (SINGULAIR ) 10 MG tablet Take 1 tablet (10 mg total) by mouth at bedtime. 05/17/23   Trudy Fusi, DO  Multiple Vitamin (MULTIVITAMIN PO) Take by mouth.    [provider]  olopatadine  (PATADAY ) 0.1 % ophthalmic solution 1 drop 2 (two) times daily.    [provider]  omeprazole (PRILOSEC) 20 MG capsule Take 20 mg by mouth as needed.    [provider]  sertraline   (ZOLOFT ) 50 MG tablet Take 1 tablet (50 mg total) by mouth daily. 09/23/23     triamcinolone  ointment (KENALOG ) 0.1 % Apply 1 application topically 2 (two) times daily. 09/11/19   Trudy Fusi, DO    Current Outpatient Medications  Medication Sig Dispense Refill   Albuterol -Budesonide  (AIRSUPRA ) 90-80 MCG/ACT AERO Inhale 2 puffs into the lungs every 4 (four) hours as needed (coughing, wheezing, chest tightness). Do not exceed 12 puffs in 24 hours. 10.7 g 2   Azelastine -Fluticasone  137-50 MCG/ACT SUSP Place 1 spray into the nose in the morning and at bedtime. 23 g 5   etonogestrel -ethinyl estradiol  (NUVARING) 0.12-0.015 MG/24HR vaginal ring Insert 1 ring vaginally as directed. 3 each 3   famotidine (PEPCID) 20 MG tablet Take 20 mg by mouth as needed for heartburn or indigestion.     levocetirizine (XYZAL ) 5 MG tablet Take 1 tablet (5 mg total) by mouth every evening. 90 tablet 3   loperamide  (IMODIUM ) 2 MG capsule Take 1 capsule (2 mg total) by mouth every 6 (six) hours. May start with 2 capsules initially 30 capsule 0   montelukast  (SINGULAIR ) 10 MG tablet Take 1 tablet (10 mg total) by mouth at bedtime. 90 tablet 3   Multiple Vitamin (MULTIVITAMIN PO) Take by mouth.     olopatadine  (PATADAY ) 0.1 % ophthalmic solution 1 drop 2 (two) times daily.     omeprazole (PRILOSEC) 20 MG capsule Take 20 mg by mouth as needed.     sertraline  (ZOLOFT ) 50 MG tablet Take 1 tablet (50 mg total) by mouth daily. 90 tablet 3   albuterol  (PROVENTIL ) (2.5 MG/3ML) 0.083% nebulizer solution Take 3 mLs (2.5 mg total) by nebulization every 6 (six) hours as needed for wheezing or shortness of breath (coughing fits). 75 mL 2   ALPRAZolam  (XANAX ) 0.25 MG tablet Take 0.25 mg by mouth as needed for anxiety.     budesonide  (PULMICORT ) 0.5 MG/2ML nebulizer solution inhale 2 mLs (0.5 mg total) by nebulization in the morning and at bedtime for 1-2 weeks during upper respiratory infections. 120 mL 0   EPINEPHrine  0.3 mg/0.3 mL IJ  SOAJ injection Inject 0.3 mg into the muscle as needed for anaphylaxis. 2 each 1   hydrocortisone  (ANUSOL -HC) 25 MG suppository Place 1 suppository (25 mg total) rectally at bedtime. 10 suppository 0   ipratropium (ATROVENT ) 0.03 % nasal spray Place 1-2 sprays into both nostrils 2 (two) times daily as needed (nasal drainage). 30 mL 5   ipratropium-albuterol  (DUONEB) 0.5-2.5 (3) MG/3ML SOLN Take 3 mLs by nebulization every 4 (four) hours as needed (coughing fits, shortness of breath, wheezing). 180 mL 2   triamcinolone  ointment (KENALOG ) 0.1 % Apply 1 application topically 2 (two) times daily. 30 g 2   Current Facility-Administered Medications  Medication Dose Route Frequency Provider Last Rate Last Admin   0.9 %  sodium chloride  infusion  500 mL Intravenous Once Colon Rueth C, MD  Allergies as of 12/09/2023 - Review Complete 12/09/2023  Allergen Reaction Noted   Latex Rash 01/12/2012   Trimethoprim   11/14/2020    Family History  Problem Relation Age of Onset   Hypertension Mother    Thyroid  disease Mother    Hyperlipidemia Mother    Hypertension Father    Irritable bowel syndrome Father    Hyperlipidemia Father    Diabetes Father    Heart disease Father    GER disease Sister    Fragile X syndrome Sister    ADD / ADHD Brother    CAD Maternal Uncle    Thyroid  disease Maternal Grandmother        HYPOTHYROID   Hypertension Maternal Grandmother    Lung cancer Maternal Grandmother    Lung cancer Maternal Grandfather    Diabetes Paternal Grandmother    Fibromyalgia Paternal Grandmother    Stroke Paternal Grandmother    Alzheimer's disease Paternal Grandfather    Migraines Son    Fragile X syndrome Son    Colon cancer Neg Hx    Esophageal cancer Neg Hx    Rectal cancer Neg Hx    Stomach cancer Neg Hx     Social History   Socioeconomic History   Marital status: Married    Spouse name: Not on file   Number of children: 2   Years of education: Not on file   Highest  education level: Master's degree (e.g., MA, MS, MEng, MEd, MSW, MBA)  Occupational History   Occupation: sonographer  Tobacco Use   Smoking status: Never   Smokeless tobacco: Never  Vaping Use   Vaping status: Never Used  Substance and Sexual Activity   Alcohol use: Yes    Comment: 1 glass per month   Drug use: No   Sexual activity: Yes  Other Topics Concern   Not on file  Social History Narrative   Lives at home with husband & son   Right handed   Caffeine: none      ** Merged History Encounter **       Social Drivers of Corporate investment banker Strain: Not on file  Food Insecurity: No Food Insecurity (10/14/2022)   Hunger Vital Sign    Worried About Running Out of Food in the Last Year: Never true    Ran Out of Food in the Last Year: Never true  Transportation Needs: No Transportation Needs (10/14/2022)   PRAPARE - Administrator, Civil Service (Medical): No    Lack of Transportation (Non-Medical): No  Physical Activity: Insufficiently Active (10/14/2022)   Exercise Vital Sign    Days of Exercise per Week: 2 days    Minutes of Exercise per Session: 20 min  Stress: Not on file  Social Connections: Not on file  Intimate Partner Violence: Not on file    Physical Exam: Vital signs in last 24 hours: BP 117/65   Pulse 73   Temp 98.1 F (36.7 C)   Ht 5' 4.5 (1.638 m)   Wt 243 lb (110.2 kg)   SpO2 97%   BMI 41.07 kg/m  GEN: NAD EYE: Sclerae anicteric ENT: MMM CV: Non-tachycardic Pulm: No increased WOB GI: Soft NEURO:  Alert & Oriented   Regino Caprio, MD [facility]   12/09/2023 9:41 AM

## 2023-12-09 NOTE — Progress Notes (Signed)
VS by [facility]

## 2023-12-09 NOTE — Op Note (Signed)
 [facility] Patient Name: Kristina Stewart Procedure Date: 12/09/2023 9:51 AM MRN: [id number] Endoscopist: Pedro Bourgeois , , [id number] Age: 38 Referring MD:  Date of Birth: July 08, 1985 Gender: Female Account #: [id number] Procedure:                Upper GI endoscopy Indications:              Epigastric abdominal pain Medicines:                Monitored Anesthesia Care Procedure:                Pre-Anesthesia Assessment:                           - Prior to the procedure, a History and Physical                            was performed, and patient medications and                            allergies were reviewed. The patient's tolerance of                            previous anesthesia was also reviewed. The risks                            and benefits of the procedure and the sedation                            options and risks were discussed with the patient.                            All questions were answered, and informed consent                            was obtained. Prior Anticoagulants: The patient has                            taken no anticoagulant or antiplatelet agents. ASA                            Grade Assessment: II - A patient with mild systemic                            disease. After reviewing the risks and benefits,                            the patient was deemed in satisfactory condition to                            undergo the procedure.                           After obtaining informed consent, the endoscope was  passed under direct vision. Throughout the                            procedure, the patient's blood pressure, pulse, and                            oxygen saturations were monitored continuously. The                            Olympus Scope SN [id number] was introduced through the                            mouth, and advanced to the second part of duodenum.                            The upper GI  endoscopy was accomplished without                            difficulty. The patient tolerated the procedure                            well. Scope In: Scope Out: Findings:                 The examined esophagus was normal.                           Localized mild inflammation with hemorrhage                            characterized by congestion (edema), erosions and                            erythema was found in the gastric antrum. Biopsies                            were taken with a cold forceps for histology.                           Localized mild inflammation characterized by                            congestion (edema) and erythema was found in the                            duodenal bulb. Complications:            No immediate complications. Estimated Blood Loss:     Estimated blood loss was minimal. Impression:               - Normal esophagus.                           - Gastritis with hemorrhage. Biopsied.                           - Duodenitis. Recommendation:           -  Discharge patient to home (with escort).                           - Await pathology results.                           - Continue omeprazole 40 mg daily for 8 weeks to                            allow inflammation in her stomach to heal.                           - Return to GI clinic in 2-3 months.                           - The findings and recommendations were discussed                            with the patient. Dr Apolonio Kluver,  12/09/2023 10:16:38 AM

## 2023-12-09 NOTE — Progress Notes (Signed)
 A/O x 3, gd SR's, VSS, report to RN

## 2023-12-10 ENCOUNTER — Other Ambulatory Visit (HOSPITAL_COMMUNITY): Payer: Self-pay

## 2023-12-10 ENCOUNTER — Telehealth: Payer: Self-pay

## 2023-12-10 NOTE — Telephone Encounter (Signed)
  Follow up Call-     12/09/2023    9:16 AM  Call back number  Post procedure Call Back phone  # [phone]  Permission to leave phone message Yes     Patient questions:  Do you have a fever, pain , or abdominal swelling? No. Pain Score  0 *  Have you tolerated food without any problems? Yes.    Have you been able to return to your normal activities? Yes.    Do you have any questions about your discharge instructions: Diet   No. Medications  No. Follow up visit  No.  Do you have questions or concerns about your Care? No.  Actions: * If pain score is 4 or above: No action needed, pain <4.

## 2023-12-13 ENCOUNTER — Ambulatory Visit: Payer: Self-pay | Admitting: Internal Medicine

## 2023-12-13 LAB — SURGICAL PATHOLOGY

## 2023-12-21 ENCOUNTER — Other Ambulatory Visit (HOSPITAL_COMMUNITY): Payer: Self-pay

## 2023-12-21 ENCOUNTER — Other Ambulatory Visit (HOSPITAL_BASED_OUTPATIENT_CLINIC_OR_DEPARTMENT_OTHER): Payer: Self-pay

## 2023-12-30 ENCOUNTER — Telehealth: Payer: Self-pay

## 2023-12-30 NOTE — Telephone Encounter (Signed)
 Called patient to schedule a follow up visit with Dr Federico no answer left message on voice mail to call office back.

## 2024-01-09 NOTE — Progress Notes (Unsigned)
 Follow Up Note  RE: Kristina Stewart MRN: [id number] DOB: 1986/03/27 Date of Office Visit: 01/10/2024  Referring provider: Lendia Boby CROME, NP-C Primary care provider: Lendia Boby CROME, NP-C  Chief Complaint: No chief complaint on file.  History of Present Illness: I had the pleasure of seeing Kristina Stewart for a follow up visit at the [facility] on 01/10/2024. She is a 38 y.o. female, who is being followed for asthma, allergic rhinoconjunctivitis on AIT, dyshidrotic eczema, frequent upper respiratory infections, oral allergy syndrome. Her previous allergy office visit was on 09/13/2023 with Dr. Luke. Today is a regular follow up visit.  Discussed the use of AI scribe software for clinical note transcription with the patient, who gave verbal consent to proceed.  History of Present Illness            ***  Assessment and Plan: [name] is a 38 y.o. female with: Moderate persistent asthma without complication Past history - Diagnosed with asthma in middle school and was doing well up until a few years ago.  Main triggers include allergies, weather change, infection, strong scents and perfumes.  During these flares she takes Advair  250 1 puff twice a day, Singulair  10 mg daily and albuterol  as needed with good benefit. 2020 spirometry was normal. Interim history - only using Trelegy on rare occasions. Used nebulizer during respiratory infections with good benefit.  Today's spirometry was normal.  Stop Trelegy. Monitor symptoms.  During upper respiratory infections/flares:  Start Pulmicort  (budesonide ) 0.5mg  nebulizer twice a day for 1-2 weeks until your breathing symptoms return to baseline.  Pretreat with albuterol  nebulizer.  If you need to use your albuterol  nebulizer machine back to back within 15-30 minutes with no relief then please go to the ER/urgent care for further evaluation.  May use Airsupra  rescue inhaler 2 puffs every 4 to 6 hours as needed  for shortness of breath, chest tightness, coughing, and wheezing. Do not use more than 12 puffs in 24 hours. May use Airsupra  rescue inhaler 2 puffs 5 to 15 minutes prior to strenuous physical activities. Rinse mouth after each use.  Coupon given. Sample given. Monitor frequency of use - if you need to use it more than twice per week on a consistent basis let us  know.  Get spirometry at next visit.   Seasonal and perennial allergic rhinoconjunctivitis Past history - Perennial rhinoconjunctivitis symptoms for the last 10+ years with worsening during change of season. 2020 skin testing positive to grass, weed, ragweed, trees, mold. Started AIT on 07/06/2019 (MOLD, [id number]). Restarted AIT on 08/13/2023.  Interim history - stable. Symptomatic if misses dymista  dose.  Continue environmental control measures. Continue dymista  (fluticasone  + azelastine  nasal spray combination) 1 spray per nostril twice a day. Nasal saline spray (i.e., Simply Saline) or nasal saline lavage (i.e., NeilMed) is recommended as needed and prior to medicated nasal sprays. Use Atrovent  (ipratropium) 0.03% 1-2 sprays per nostril twice a day as needed for runny nose/drainage. Use over the counter antihistamines such as Zyrtec (cetirizine), Claritin (loratadine), Allegra (fexofenadine), or Xyzal  (levocetirizine) daily as needed. May take twice a day during allergy flares. May switch antihistamines every few months. Continue Singulair  (montelukast ) 10mg  daily at night. Continue allergy injections May come in twice a week during build up (Mondays and Thursdays OR Tuesdays and Fridays).  If you have issues let the nurse know. Once a week on red vial.    Dyshidrotic eczema Past history - Dyshidrotic eczema on the palms bilaterally.  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  Eucrisa  ineffective. Interim history - flares at times but managed with below meds. Use triamcinolone  0.1% ointment  twice a day as needed for eczema flares. Do not use on the face, neck, armpits or groin area. Do not use more than 3 weeks in a row.  Continue proper skin care.   History of frequent upper respiratory infection Past history - 4 sinus infections per year. Wants to know if she needs a pneumonia vaccine. Interim history - 2 sinus infections the past year.  Keep track of infections and antibiotics use. Consider ENT referral to look at sinus anatomy in the future. Recheck pneumococcal titers if more frequent infections.    Pollen-food allergy, subsequent encounter Past history - Certain raw fruits and almonds cause perioral pruritus.  Tolerates processed and cooked forms with no issues.  Patient questionably had a reaction to pistachios but it was cross contaminated almonds in the past.  She tolerates other tree nuts including cashews, walnuts, pecans and hazelnuts with no issues. 2020 skin testing was borderline positive to almonds and negative pistachios. Okay with pistachios and nectarines.  Interim history - able to tolerate apples and almonds.  Avoid foods that are bothersome.   Assessment and Plan              No follow-ups on file.  No orders of the defined types were placed in this encounter.  Lab Orders  No laboratory test(s) ordered today    Diagnostics: Spirometry:  Tracings reviewed. Her effort: {Blank single:19197::Good reproducible efforts.,It was hard to get consistent efforts and there is a question as to whether this reflects a maximal maneuver.,Poor effort, data can not be interpreted.} FVC: ***L FEV1: ***L, ***% predicted FEV1/FVC ratio: ***% Interpretation: {Blank single:19197::Spirometry consistent with mild obstructive disease,Spirometry consistent with moderate obstructive disease,Spirometry consistent with severe obstructive disease,Spirometry consistent with possible restrictive disease,Spirometry consistent with mixed obstructive and  restrictive disease,Spirometry uninterpretable due to technique,Spirometry consistent with normal pattern,No overt abnormalities noted given today's efforts}.  Please see scanned spirometry results for details.  Skin Testing: {Blank single:19197::Select foods,Environmental allergy panel,Environmental allergy panel and select foods,Food allergy panel,None,Deferred due to recent antihistamines use}. *** Results discussed with patient/family.   Medication List:  Current Outpatient Medications  Medication Sig Dispense Refill  . albuterol  (PROVENTIL ) (2.5 MG/3ML) 0.083% nebulizer solution Take 3 mLs (2.5 mg total) by nebulization every 6 (six) hours as needed for wheezing or shortness of breath (coughing fits). 75 mL 2  . Albuterol -Budesonide  (AIRSUPRA ) 90-80 MCG/ACT AERO Inhale 2 puffs into the lungs every 4 (four) hours as needed (coughing, wheezing, chest tightness). Do not exceed 12 puffs in 24 hours. 10.7 g 2  . ALPRAZolam  (XANAX ) 0.25 MG tablet Take 0.25 mg by mouth as needed for anxiety.    . Azelastine -Fluticasone  137-50 MCG/ACT SUSP Place 1 spray into the nose in the morning and at bedtime. 23 g 5  . budesonide  (PULMICORT ) 0.5 MG/2ML nebulizer solution inhale 2 mLs (0.5 mg total) by nebulization in the morning and at bedtime for 1-2 weeks during upper respiratory infections. 120 mL 0  . EPINEPHrine  0.3 mg/0.3 mL IJ SOAJ injection Inject 0.3 mg into the muscle as needed for anaphylaxis. 2 each 1  . etonogestrel -ethinyl estradiol  (NUVARING) 0.12-0.015 MG/24HR vaginal ring Insert 1 ring vaginally as directed. 3 each 3  . famotidine (PEPCID) 20 MG tablet Take 20 mg by mouth as needed for heartburn or indigestion.    . hydrocortisone  (  ANUSOL -HC) 25 MG suppository Place 1 suppository (25 mg total) rectally at bedtime. 10 suppository 0  . ipratropium (ATROVENT ) 0.03 % nasal spray Place 1-2 sprays into both nostrils 2 (two) times daily as needed (nasal drainage). 30 mL 5  .  ipratropium-albuterol  (DUONEB) 0.5-2.5 (3) MG/3ML SOLN Take 3 mLs by nebulization every 4 (four) hours as needed (coughing fits, shortness of breath, wheezing). 180 mL 2  . levocetirizine (XYZAL ) 5 MG tablet Take 1 tablet (5 mg total) by mouth every evening. 90 tablet 3  . loperamide  (IMODIUM ) 2 MG capsule Take 1 capsule (2 mg total) by mouth every 6 (six) hours. May start with 2 capsules initially 30 capsule 0  . montelukast  (SINGULAIR ) 10 MG tablet Take 1 tablet (10 mg total) by mouth at bedtime. 90 tablet 3  . Multiple Vitamin (MULTIVITAMIN PO) Take by mouth.    . olopatadine  (PATADAY ) 0.1 % ophthalmic solution 1 drop 2 (two) times daily.    . omeprazole  (PRILOSEC) 20 MG capsule Take 2 capsules (40 mg total) by mouth daily for 56 days, THEN 1 capsule (20 mg total) daily as needed. 168 capsule 0  . sertraline  (ZOLOFT ) 50 MG tablet Take 1 tablet (50 mg total) by mouth daily. 90 tablet 3  . triamcinolone  ointment (KENALOG ) 0.1 % Apply 1 application topically 2 (two) times daily. 30 g 2   Current Facility-Administered Medications  Medication Dose Route Frequency Provider Last Rate Last Admin  . 0.9 %  sodium chloride  infusion  500 mL Intravenous Once Dorsey, Ying C, MD       Allergies: Allergies  Allergen Reactions  . Latex Rash  . Trimethoprim     I reviewed her past medical history, social history, family history, and environmental history and no significant changes have been reported from her previous visit.  Review of Systems  Constitutional:  Negative for appetite change, chills, fever and unexpected weight change.  HENT:  Negative for postnasal drip and rhinorrhea.   Respiratory:  Negative for cough, chest tightness, shortness of breath and wheezing.   Cardiovascular:  Negative for chest pain.  Gastrointestinal:  Negative for abdominal pain.  Genitourinary:  Negative for difficulty urinating.  Skin:  Negative for rash.  Allergic/Immunologic: Positive for environmental allergies and  food allergies.    Objective: There were no vitals taken for this visit. There is no height or weight on file to calculate BMI. Physical Exam Vitals and nursing note reviewed.  Constitutional:      Appearance: Normal appearance. She is well-developed.  HENT:     Head: Normocephalic and atraumatic.     Right Ear: Tympanic membrane and external ear normal.     Left Ear: Tympanic membrane and external ear normal.     Nose: Nose normal.     Mouth/Throat:     Mouth: Mucous membranes are moist.     Pharynx: Oropharynx is clear.  Eyes:     Conjunctiva/sclera: Conjunctivae normal.  Cardiovascular:     Rate and Rhythm: Normal rate and regular rhythm.     Heart sounds: Normal heart sounds. No murmur heard.    No friction rub. No gallop.  Pulmonary:     Effort: Pulmonary effort is normal.     Breath sounds: Normal breath sounds. No wheezing, rhonchi or rales.  Musculoskeletal:     Cervical back: Neck supple.  Skin:    General: Skin is warm.     Findings: No rash.  Neurological:     Mental Status: She is alert and  oriented to person, place, and time.  Psychiatric:        Behavior: Behavior normal.   Previous notes and tests were reviewed. The plan was reviewed with the patient/family, and all questions/concerned were addressed.  It was my pleasure to see Kristina Stewart today and participate in her care. Please feel free to contact me with any questions or concerns.  Sincerely,  Orlan Cramp, DO Allergy & Immunology  [facility]  [facility] office: [phone] [facility] office: [phone]

## 2024-01-10 ENCOUNTER — Other Ambulatory Visit (HOSPITAL_COMMUNITY): Payer: Self-pay

## 2024-01-10 ENCOUNTER — Encounter: Payer: Self-pay | Admitting: Allergy

## 2024-01-10 ENCOUNTER — Encounter: Payer: Self-pay | Admitting: Family Medicine

## 2024-01-10 ENCOUNTER — Other Ambulatory Visit: Payer: Self-pay

## 2024-01-10 ENCOUNTER — Ambulatory Visit: Admitting: Allergy

## 2024-01-10 VITALS — BP 122/76 | HR 95 | Temp 97.7°F | Resp 18 | Ht 64.5 in | Wt 248.5 lb

## 2024-01-10 DIAGNOSIS — J3089 Other allergic rhinitis: Secondary | ICD-10-CM

## 2024-01-10 DIAGNOSIS — H1010 Acute atopic conjunctivitis, unspecified eye: Secondary | ICD-10-CM | POA: Diagnosis not present

## 2024-01-10 DIAGNOSIS — J4540 Moderate persistent asthma, uncomplicated: Secondary | ICD-10-CM | POA: Diagnosis not present

## 2024-01-10 DIAGNOSIS — J45909 Unspecified asthma, uncomplicated: Secondary | ICD-10-CM | POA: Diagnosis not present

## 2024-01-10 DIAGNOSIS — Z8709 Personal history of other diseases of the respiratory system: Secondary | ICD-10-CM | POA: Diagnosis not present

## 2024-01-10 DIAGNOSIS — T781XXD Other adverse food reactions, not elsewhere classified, subsequent encounter: Secondary | ICD-10-CM

## 2024-01-10 DIAGNOSIS — J302 Other seasonal allergic rhinitis: Secondary | ICD-10-CM | POA: Diagnosis not present

## 2024-01-10 DIAGNOSIS — J45998 Other asthma: Secondary | ICD-10-CM | POA: Diagnosis not present

## 2024-01-10 DIAGNOSIS — L301 Dyshidrosis [pompholyx]: Secondary | ICD-10-CM | POA: Diagnosis not present

## 2024-01-10 MED ORDER — BREZTRI AEROSPHERE 160-9-4.8 MCG/ACT IN AERO
2.0000 | INHALATION_SPRAY | Freq: Two times a day (BID) | RESPIRATORY_TRACT | 5 refills | Status: DC
  Filled 2024-01-10: qty 10.7, 30d supply, fill #0

## 2024-01-10 NOTE — Patient Instructions (Addendum)
 Asthma Daily controller medication(s):  Try Breztri  2 puffs twice a day with spacer and rinse mouth afterwards. Samples given. This replaces Trelegy.  Let me know which one works better for you.  Spacer given and demonstrated proper use with inhaler. Patient understood technique and all questions/concerned were addressed.  During respiratory infections/flares:  Start Pulmicort  (budesonide ) 0.5mg  nebulizer twice a day for 1-2 weeks until your breathing symptoms return to baseline.  Pretreat with albuterol  nebulizer.  If you need to use your albuterol  nebulizer machine back to back within 15-30 minutes with no relief then please go to the ER/urgent care for further evaluation.  May use Airsupra  rescue inhaler 2 puffs every 4 to 6 hours as needed for shortness of breath, chest tightness, coughing, and wheezing. Do not use more than 12 puffs in 24 hours. May use Airsupra  rescue inhaler 2 puffs 5 to 15 minutes prior to strenuous physical activities. Rinse mouth after each use.  Monitor frequency of use - if you need to use it more than twice per week on a consistent basis let us  know.  Asthma control goals:  Full participation in all desired activities (may need albuterol  before activity) Albuterol  use two times or less a week on average (not counting use with activity) Cough interfering with sleep two times or less a month Oral steroids no more than once a year No hospitalizations   Consider biologics if above inhalers are not effective.  Allergic conjunctivitis Past skin testing positive to grass, weed, ragweed, trees, mold.  Continue environmental control measures. Continue dymista  (fluticasone  + azelastine  nasal spray combination) 1 spray per nostril twice a day. Nasal saline spray (i.e., Simply Saline) or nasal saline lavage (i.e., NeilMed) is recommended as needed and prior to medicated nasal sprays.  Use Atrovent  (ipratropium) 0.03% 1-2 sprays per nostril twice a day as needed for runny  nose/drainage. Use over the counter antihistamines such as Zyrtec (cetirizine), Claritin (loratadine), Allegra (fexofenadine), or Xyzal  (levocetirizine) daily as needed. May take twice a day during allergy flares. May switch antihistamines every few months. Continue Singulair  (montelukast ) 10mg  daily at night. Continue allergy injections May come in twice a week during build up (Mondays and Thursdays OR Tuesdays and Fridays).  If you have issues let the nurse know. Once a week on red vial.   Recurrent infections Keep track of infections and antibiotics use.   Pollen-food allergy Avoid foods that are bothersome. For mild symptoms you can take over the counter antihistamines (zyrtec 10mg  to 20mg ) and monitor symptoms closely.  If symptoms worsen or if you have severe symptoms including breathing issues, throat closure, significant swelling, whole body hives, severe diarrhea and vomiting, lightheadedness then seek immediate medical care afterwards.   Dyshidrotic eczema Use triamcinolone  0.1% ointment twice a day as needed for eczema flares. Do not use on the face, neck, armpits or groin area. Do not use more than 3 weeks in a row.  Continue proper skin care.  Follow up in 3 months or sooner if needed.

## 2024-01-19 ENCOUNTER — Ambulatory Visit (INDEPENDENT_AMBULATORY_CARE_PROVIDER_SITE_OTHER)

## 2024-01-19 DIAGNOSIS — J309 Allergic rhinitis, unspecified: Secondary | ICD-10-CM

## 2024-01-20 ENCOUNTER — Other Ambulatory Visit (HOSPITAL_COMMUNITY): Payer: Self-pay

## 2024-01-24 ENCOUNTER — Ambulatory Visit (INDEPENDENT_AMBULATORY_CARE_PROVIDER_SITE_OTHER)

## 2024-01-24 DIAGNOSIS — J309 Allergic rhinitis, unspecified: Secondary | ICD-10-CM | POA: Diagnosis not present

## 2024-01-27 ENCOUNTER — Encounter: Payer: Self-pay | Admitting: Family Medicine

## 2024-01-27 ENCOUNTER — Ambulatory Visit (INDEPENDENT_AMBULATORY_CARE_PROVIDER_SITE_OTHER)

## 2024-01-27 ENCOUNTER — Ambulatory Visit (INDEPENDENT_AMBULATORY_CARE_PROVIDER_SITE_OTHER): Admitting: Family Medicine

## 2024-01-27 VITALS — BP 120/72 | HR 85 | Temp 97.6°F | Ht 64.5 in | Wt 246.0 lb

## 2024-01-27 DIAGNOSIS — J309 Allergic rhinitis, unspecified: Secondary | ICD-10-CM | POA: Diagnosis not present

## 2024-01-27 DIAGNOSIS — Z8349 Family history of other endocrine, nutritional and metabolic diseases: Secondary | ICD-10-CM

## 2024-01-27 DIAGNOSIS — M25562 Pain in left knee: Secondary | ICD-10-CM

## 2024-01-27 DIAGNOSIS — R232 Flushing: Secondary | ICD-10-CM

## 2024-01-27 DIAGNOSIS — M25561 Pain in right knee: Secondary | ICD-10-CM | POA: Diagnosis not present

## 2024-01-27 DIAGNOSIS — G8929 Other chronic pain: Secondary | ICD-10-CM | POA: Diagnosis not present

## 2024-01-27 DIAGNOSIS — R5383 Other fatigue: Secondary | ICD-10-CM

## 2024-01-27 DIAGNOSIS — Z1589 Genetic susceptibility to other disease: Secondary | ICD-10-CM | POA: Diagnosis not present

## 2024-01-27 NOTE — Progress Notes (Signed)
 Subjective:     Patient ID: Kristina Stewart, female    DOB: 04-24-1986, 38 y.o.   MRN: [id number]  Chief Complaint  Patient presents with   Knee Pain    Right knee problems since a teenager, now turning into pain and wants to go to ortho or sports med provider   Fatigue    With hot sweats, has family hx of tyroid issues and has had multiple labs drawn to check for thyroid  issues but thinks she checks all the boxes for it so she thinks something is wrong     Knee Pain     Discussed the use of AI scribe software for clinical note transcription with the patient, who gave verbal consent to proceed.  History of Present Illness    C/o bilateral knee pain, worse on the right. Pain is constant, even at rest, but worse with walking. Hx of injury, falling onto her right knee at one point in the past.  Popping and cracking at times. No giving away or locking.  Requests referral to orthopedist.   C/o fatigue, night sweats, hot flashes   Possible perimenopausal due to genetic condition.  Wakes up feeling rested. Lightly snores. No daytime somnolence   Takes prenatal vitamin   Health Maintenance Due  Topic Date Due   Hepatitis B Vaccines (1 of 3 - 19+ 3-dose series) Never done   HPV VACCINES (1 - 3-dose SCDM series) Never done   COVID-19 Vaccine (5 - 2024-25 season) 02/28/2023   Cervical Cancer Screening (HPV/Pap Cotest)  04/18/2023    Past Medical History:  Diagnosis Date   Allergy    Anxiety    Asthma    Family history of adverse reaction to anesthesia    sister problems with propofol   Fragile X chromosomal anomaly    IBS (irritable bowel syndrome)    Obesity    PAC (premature atrial contraction)    Seasonal allergic rhinitis    Strep pharyngitis    TMJ (temporomandibular joint syndrome)    Vertigo     Past Surgical History:  Procedure Laterality Date   BRAIN SURGERY     craniotomy/arnold chiari malformation; craniotomy & laminectomy    Family History   Problem Relation Age of Onset   Hypertension Mother    Thyroid  disease Mother    Hyperlipidemia Mother    Hypertension Father    Irritable bowel syndrome Father    Hyperlipidemia Father    Diabetes Father    Heart disease Father    GER disease Sister    Fragile X syndrome Sister    ADD / ADHD Brother    CAD Maternal Uncle    Thyroid  disease Maternal Grandmother        HYPOTHYROID   Hypertension Maternal Grandmother    Lung cancer Maternal Grandmother    Lung cancer Maternal Grandfather    Diabetes Paternal Grandmother    Fibromyalgia Paternal Grandmother    Stroke Paternal Grandmother    Alzheimer's disease Paternal Grandfather    Migraines Son    Fragile X syndrome Son    Colon cancer Neg Hx    Esophageal cancer Neg Hx    Rectal cancer Neg Hx    Stomach cancer Neg Hx     Social History   Socioeconomic History   Marital status: Married    Spouse name: Not on file   Number of children: 2   Years of education: Not on file   Highest education level: Master's degree (e.g., MA,  MS, MEng, MEd, MSW, MBA)  Occupational History   Occupation: sonographer  Tobacco Use   Smoking status: Never   Smokeless tobacco: Never  Vaping Use   Vaping status: Never Used  Substance and Sexual Activity   Alcohol use: Yes    Comment: 1 glass per month   Drug use: No   Sexual activity: Yes  Other Topics Concern   Not on file  Social History Narrative   Lives at home with husband & son   Right handed   Caffeine: none      ** Merged History Encounter **       Social Drivers of Corporate investment banker Strain: Not on file  Food Insecurity: No Food Insecurity (10/14/2022)   Hunger Vital Sign    Worried About Running Out of Food in the Last Year: Never true    Ran Out of Food in the Last Year: Never true  Transportation Needs: No Transportation Needs (10/14/2022)   PRAPARE - Administrator, Civil Service (Medical): No    Lack of Transportation (Non-Medical): No   Physical Activity: Insufficiently Active (10/14/2022)   Exercise Vital Sign    Days of Exercise per Week: 2 days    Minutes of Exercise per Session: 20 min  Stress: Not on file  Social Connections: Not on file  Intimate Partner Violence: Not on file    Outpatient Medications Prior to Visit  Medication Sig Dispense Refill   albuterol  (PROVENTIL ) (2.5 MG/3ML) 0.083% nebulizer solution Take 3 mLs (2.5 mg total) by nebulization every 6 (six) hours as needed for wheezing or shortness of breath (coughing fits). 75 mL 2   Albuterol -Budesonide  (AIRSUPRA ) 90-80 MCG/ACT AERO Inhale 2 puffs into the lungs every 4 (four) hours as needed (coughing, wheezing, chest tightness). Do not exceed 12 puffs in 24 hours. 10.7 g 2   ALPRAZolam  (XANAX ) 0.25 MG tablet Take 0.25 mg by mouth as needed for anxiety.     Azelastine -Fluticasone  137-50 MCG/ACT SUSP Place 1 spray into the nose in the morning and at bedtime. 23 g 5   budesonide  (PULMICORT ) 0.5 MG/2ML nebulizer solution inhale 2 mLs (0.5 mg total) by nebulization in the morning and at bedtime for 1-2 weeks during upper respiratory infections. 120 mL 0   cetirizine (ZYRTEC) 10 MG tablet Take 10 mg by mouth every morning.     EPINEPHrine  0.3 mg/0.3 mL IJ SOAJ injection Inject 0.3 mg into the muscle as needed for anaphylaxis. 2 each 1   etonogestrel -ethinyl estradiol  (NUVARING) 0.12-0.015 MG/24HR vaginal ring Insert 1 ring vaginally as directed. 3 each 3   famotidine (PEPCID) 20 MG tablet Take 20 mg by mouth as needed for heartburn or indigestion.     Fluticasone -Umeclidin-Vilant (TRELEGY ELLIPTA  IN) Inhale into the lungs.     ipratropium (ATROVENT ) 0.03 % nasal spray Place 1-2 sprays into both nostrils 2 (two) times daily as needed (nasal drainage). 30 mL 5   ipratropium-albuterol  (DUONEB) 0.5-2.5 (3) MG/3ML SOLN Take 3 mLs by nebulization every 4 (four) hours as needed (coughing fits, shortness of breath, wheezing). 180 mL 2   levocetirizine (XYZAL ) 5 MG tablet  Take 1 tablet (5 mg total) by mouth every evening. 90 tablet 3   loperamide  (IMODIUM ) 2 MG capsule Take 1 capsule (2 mg total) by mouth every 6 (six) hours. May start with 2 capsules initially 30 capsule 0   montelukast  (SINGULAIR ) 10 MG tablet Take 1 tablet (10 mg total) by mouth at bedtime. 90 tablet 3  Multiple Vitamin (MULTIVITAMIN PO) Take by mouth.     olopatadine  (PATADAY ) 0.1 % ophthalmic solution 1 drop 2 (two) times daily.     omeprazole  (PRILOSEC) 20 MG capsule Take 2 capsules (40 mg total) by mouth daily for 56 days, THEN 1 capsule (20 mg total) daily as needed. 168 capsule 0   sertraline  (ZOLOFT ) 50 MG tablet Take 1 tablet (50 mg total) by mouth daily. 90 tablet 3   triamcinolone  ointment (KENALOG ) 0.1 % Apply 1 application topically 2 (two) times daily. 30 g 2   budesonide -glycopyrrolate -formoterol  (BREZTRI  AEROSPHERE) 160-9-4.8 MCG/ACT AERO inhaler Inhale 2 puffs into the lungs in the morning and at bedtime. Use with spacer and rinse mouth afterwards. (Patient not taking: Reported on 01/27/2024) 10.7 g 5   Facility-Administered Medications Prior to Visit  Medication Dose Route Frequency Provider Last Rate Last Admin   0.9 %  sodium chloride  infusion  500 mL Intravenous Once Dorsey, Ying C, MD        Allergies  Allergen Reactions   Latex Rash and Dermatitis   Trimethoprim      Review of Systems  Constitutional:  Positive for diaphoresis and malaise/fatigue. Negative for chills, fever and weight loss.  Respiratory:  Negative for shortness of breath.   Cardiovascular:  Negative for chest pain, palpitations and leg swelling.  Gastrointestinal:  Negative for abdominal pain, constipation, diarrhea, nausea and vomiting.  Genitourinary:  Negative for dysuria, frequency and urgency.  Musculoskeletal:  Positive for joint pain.  Neurological:  Negative for dizziness and focal weakness.       Objective:    Physical Exam Constitutional:      General: She is not in acute  distress.    Appearance: She is obese. She is not ill-appearing.  Eyes:     Extraocular Movements: Extraocular movements intact.     Conjunctiva/sclera: Conjunctivae normal.  Cardiovascular:     Rate and Rhythm: Normal rate.  Pulmonary:     Effort: Pulmonary effort is normal.  Musculoskeletal:     Cervical back: Normal range of motion and neck supple.  Skin:    General: Skin is warm and dry.  Neurological:     General: No focal deficit present.     Mental Status: She is alert and oriented to person, place, and time.     Motor: No weakness.     Coordination: Coordination normal.     Gait: Gait normal.  Psychiatric:        Mood and Affect: Mood normal.        Behavior: Behavior normal.        Thought Content: Thought content normal.      BP 120/72   Pulse 85   Temp 97.6 F (36.4 C) (Temporal)   Ht 5' 4.5 (1.638 m)   Wt 246 lb (111.6 kg)   SpO2 98%   BMI 41.57 kg/m  Wt Readings from Last 3 Encounters:  01/27/24 246 lb (111.6 kg)  01/10/24 248 lb 8 oz (112.7 kg)  12/09/23 243 lb (110.2 kg)       Assessment & Plan:   Problem List Items Addressed This Visit     MTHFR mutation (Chronic)   Relevant Orders   Ambulatory referral to Endocrinology   Family history of Hashimoto thyroiditis   Relevant Orders   Ambulatory referral to Endocrinology   Fatigue   Relevant Orders   Ambulatory referral to Endocrinology   Severe obesity (BMI >= 40) (HCC)   Other Visit Diagnoses  Chronic pain of both knees    -  Primary   Relevant Orders   Ambulatory referral to Orthopedic Surgery     Hot flashes       Relevant Orders   Ambulatory referral to Endocrinology      Referral to orthopedist for further evaluation and treatment of chronic knee pain.  She is concerned her symptoms of fatigue, hot flashes and night sweats are related to thyroid  disorder. Reviewed thyroid  panel including negative antibodies with patient from 06/2023. She has discussed symptoms with OB/GYN  as well. Referral to endocrinologist for their opinion.    I have discontinued Donnika Bartol's Breztri  Aerosphere. I am also having her maintain her Multiple Vitamin (MULTIVITAMIN PO), triamcinolone  ointment, albuterol , olopatadine , loperamide , ipratropium-albuterol , EPINEPHrine , ipratropium, etonogestrel -ethinyl estradiol , budesonide , levocetirizine, montelukast , Azelastine -Fluticasone , Airsupra , sertraline , ALPRAZolam , famotidine, omeprazole , cetirizine, and Fluticasone -Umeclidin-Vilant (TRELEGY ELLIPTA  IN). We will continue to administer sodium chloride .  No orders of the defined types were placed in this encounter.

## 2024-02-03 ENCOUNTER — Ambulatory Visit (HOSPITAL_BASED_OUTPATIENT_CLINIC_OR_DEPARTMENT_OTHER): Admitting: Cardiology

## 2024-02-07 ENCOUNTER — Ambulatory Visit (INDEPENDENT_AMBULATORY_CARE_PROVIDER_SITE_OTHER)

## 2024-02-07 DIAGNOSIS — J309 Allergic rhinitis, unspecified: Secondary | ICD-10-CM

## 2024-02-10 ENCOUNTER — Ambulatory Visit (INDEPENDENT_AMBULATORY_CARE_PROVIDER_SITE_OTHER)

## 2024-02-10 DIAGNOSIS — J309 Allergic rhinitis, unspecified: Secondary | ICD-10-CM

## 2024-02-14 ENCOUNTER — Ambulatory Visit (INDEPENDENT_AMBULATORY_CARE_PROVIDER_SITE_OTHER)

## 2024-02-14 DIAGNOSIS — J309 Allergic rhinitis, unspecified: Secondary | ICD-10-CM | POA: Diagnosis not present

## 2024-02-17 ENCOUNTER — Ambulatory Visit (INDEPENDENT_AMBULATORY_CARE_PROVIDER_SITE_OTHER)

## 2024-02-17 DIAGNOSIS — J309 Allergic rhinitis, unspecified: Secondary | ICD-10-CM

## 2024-02-21 ENCOUNTER — Ambulatory Visit (INDEPENDENT_AMBULATORY_CARE_PROVIDER_SITE_OTHER)

## 2024-02-21 DIAGNOSIS — J309 Allergic rhinitis, unspecified: Secondary | ICD-10-CM | POA: Diagnosis not present

## 2024-02-24 ENCOUNTER — Other Ambulatory Visit: Payer: Self-pay | Admitting: Allergy

## 2024-02-24 ENCOUNTER — Other Ambulatory Visit (HOSPITAL_COMMUNITY): Payer: Self-pay

## 2024-02-25 ENCOUNTER — Other Ambulatory Visit (HOSPITAL_COMMUNITY): Payer: Self-pay

## 2024-02-25 ENCOUNTER — Other Ambulatory Visit: Payer: Self-pay

## 2024-02-25 MED ORDER — AZELASTINE-FLUTICASONE 137-50 MCG/ACT NA SUSP
1.0000 | Freq: Two times a day (BID) | NASAL | 5 refills | Status: AC
  Filled 2024-02-25 – 2024-03-09 (×2): qty 23, 30d supply, fill #0
  Filled 2024-05-01: qty 23, 30d supply, fill #1
  Filled 2024-05-29: qty 23, 30d supply, fill #2
  Filled 2024-07-13: qty 23, 30d supply, fill #3

## 2024-02-29 ENCOUNTER — Ambulatory Visit (INDEPENDENT_AMBULATORY_CARE_PROVIDER_SITE_OTHER)

## 2024-02-29 ENCOUNTER — Encounter: Payer: Self-pay | Admitting: Allergy

## 2024-02-29 DIAGNOSIS — J309 Allergic rhinitis, unspecified: Secondary | ICD-10-CM

## 2024-03-07 ENCOUNTER — Other Ambulatory Visit (HOSPITAL_COMMUNITY): Payer: Self-pay

## 2024-03-09 ENCOUNTER — Other Ambulatory Visit: Payer: Self-pay

## 2024-03-09 ENCOUNTER — Other Ambulatory Visit (HOSPITAL_COMMUNITY): Payer: Self-pay

## 2024-03-09 MED ORDER — ETONOGESTREL-ETHINYL ESTRADIOL 0.12-0.015 MG/24HR VA RING
VAGINAL_RING | VAGINAL | 0 refills | Status: DC
  Filled 2024-03-09: qty 3, 84d supply, fill #0

## 2024-03-13 ENCOUNTER — Encounter: Payer: Self-pay | Admitting: Family Medicine

## 2024-03-13 NOTE — Progress Notes (Unsigned)
 [facility] [address] [place] [address] Phone: [phone]   Assessment and Plan:     1. Chronic pain of both knees (Primary) 2. Patellofemoral pain syndrome of both knees -Chronic with exacerbation, initial sports medicine visit - Most consistent with bilateral patellofemoral syndrome exacerbated by physical activities, hypermobility, hyperextension -Recommend HEP and physical therapy to improve patellar tracking, stability and decrease chronic symptoms - Start prednisone  Dosepak - Patient does not use NSAIDs due to past medical history of Chronic gastritis - X-ray obtained in clinic.  My interpretation: No acute fracture or dislocation  15 additional minutes spent for educating Therapeutic Home Exercise Program.  This included exercises focusing on stretching, strengthening, with focus on eccentric aspects.   Long term goals include an improvement in range of motion, strength, endurance as well as avoiding reinjury. Patient's frequency would include in 1-2 times a day, 3-5 times a week for a duration of 6-12 weeks. Proper technique shown and discussed handout in great detail with ATC.  All questions were discussed and answered.    Pertinent previous records reviewed include none   Follow Up: 4 weeks for reevaluation.  If no improvement or worsening of symptoms, could consider patellar braces, CSI   Subjective:   I, Kristina Stewart, am serving as a Neurosurgeon for Doctor Kristina Stewart  Chief Complaint: bilat knee pain   HPI:   03/14/2024 Patient is a 38 year old female with knee pain. Patient states bilat knee pain . Right is greater than left. Past year she has had increased knee flares. Its a tension feel like it needs to be cracked. KT tape relieves the pain . No meds for the pain. No radiating pain. No numbness or tingling. Pain when going up and down steps.    Relevant Historical Information: GERD, chronic gastritis,  Fragile X mutation carrier  Additional pertinent review of systems negative.   Current Outpatient Medications:    methylPREDNISolone  (MEDROL  DOSEPAK) 4 MG TBPK tablet, Take 6 tablets on day 1.  Take 5 tablets on day 2.  Take 4 tablets on day 3.  Take 3 tablets on day 4.  Take 2 tablets on day 5.  Take 1 tablet on day 6., Disp: 21 tablet, Rfl: 0   albuterol  (PROVENTIL ) (2.5 MG/3ML) 0.083% nebulizer solution, Take 3 mLs (2.5 mg total) by nebulization every 6 (six) hours as needed for wheezing or shortness of breath (coughing fits)., Disp: 75 mL, Rfl: 2   Albuterol -Budesonide  (AIRSUPRA ) 90-80 MCG/ACT AERO, Inhale 2 puffs into the lungs every 4 (four) hours as needed (coughing, wheezing, chest tightness). Do not exceed 12 puffs in 24 hours., Disp: 10.7 g, Rfl: 2   ALPRAZolam  (XANAX ) 0.25 MG tablet, Take 0.25 mg by mouth as needed for anxiety., Disp: , Rfl:    Azelastine -Fluticasone  137-50 MCG/ACT SUSP, Place 1 spray into the nose in the morning and at bedtime., Disp: 23 g, Rfl: 5   budesonide  (PULMICORT ) 0.5 MG/2ML nebulizer solution, inhale 2 mLs (0.5 mg total) by nebulization in the morning and at bedtime for 1-2 weeks during upper respiratory infections., Disp: 120 mL, Rfl: 0   cetirizine (ZYRTEC) 10 MG tablet, Take 10 mg by mouth every morning., Disp: , Rfl:    EPINEPHrine  0.3 mg/0.3 mL IJ SOAJ injection, Inject 0.3 mg into the muscle as needed for anaphylaxis., Disp: 2 each, Rfl: 1   etonogestrel -ethinyl estradiol  (NUVARING) 0.12-0.015 MG/24HR vaginal ring, Insert 1 ring vaginally as directed., Disp: 3 each,  Rfl: 0   famotidine (PEPCID) 20 MG tablet, Take 20 mg by mouth as needed for heartburn or indigestion., Disp: , Rfl:    Fluticasone -Umeclidin-Vilant (TRELEGY ELLIPTA  IN), Inhale into the lungs., Disp: , Rfl:    ipratropium (ATROVENT ) 0.03 % nasal spray, Place 1-2 sprays into both nostrils 2 (two) times daily as needed (nasal drainage)., Disp: 30 mL, Rfl: 5   ipratropium-albuterol  (DUONEB)  0.5-2.5 (3) MG/3ML SOLN, Take 3 mLs by nebulization every 4 (four) hours as needed (coughing fits, shortness of breath, wheezing)., Disp: 180 mL, Rfl: 2   levocetirizine (XYZAL ) 5 MG tablet, Take 1 tablet (5 mg total) by mouth every evening., Disp: 90 tablet, Rfl: 3   loperamide  (IMODIUM ) 2 MG capsule, Take 1 capsule (2 mg total) by mouth every 6 (six) hours. May start with 2 capsules initially, Disp: 30 capsule, Rfl: 0   montelukast  (SINGULAIR ) 10 MG tablet, Take 1 tablet (10 mg total) by mouth at bedtime., Disp: 90 tablet, Rfl: 3   Multiple Vitamin (MULTIVITAMIN PO), Take by mouth., Disp: , Rfl:    olopatadine  (PATADAY ) 0.1 % ophthalmic solution, 1 drop 2 (two) times daily., Disp: , Rfl:    omeprazole  (PRILOSEC) 20 MG capsule, Take 2 capsules (40 mg total) by mouth daily for 56 days, THEN 1 capsule (20 mg total) daily as needed., Disp: 168 capsule, Rfl: 0   sertraline  (ZOLOFT ) 50 MG tablet, Take 1 tablet (50 mg total) by mouth daily., Disp: 90 tablet, Rfl: 3   triamcinolone  ointment (KENALOG ) 0.1 %, Apply 1 application topically 2 (two) times daily., Disp: 30 g, Rfl: 2  Current Facility-Administered Medications:    0.9 %  sodium chloride  infusion, 500 mL, Intravenous, Once, Kristina Rosario BROCKS, MD   Objective:     Vitals:   03/14/24 1526  Pulse: 83  SpO2: 97%  Weight: 246 lb (111.6 kg)  Height: 5' 4 (1.626 m)      Body mass index is 42.23 kg/m.    Physical Exam:    General:  awake, alert oriented, no acute distress nontoxic Skin: no suspicious lesions or rashes Neuro:sensation intact and strength 5/5 with no deficits, no atrophy, normal muscle tone Psych: No signs of anxiety, depression or other mood disorder  Lab knee: Crepitus present.  Positive J sign.  Positive patellar grind.  Excess translation of patella bilaterally No swelling No deformity Neg fluid wave, joint milking ROM Flex 110, Ext -5 TTP lateral femoral condyle NTTP over the quad tendon, medial fem condyle,   patella, plica, patella tendon, tibial tuberostiy, fibular head, posterior fossa, pes anserine bursa, gerdy's tubercle, medial jt line, lateral jt line Neg anterior and posterior drawer Neg lachman Neg sag sign Negative varus stress Negative valgus stress Negative McMurray Negative Thessaly  Gait normal    Electronically signed by:  Odis Stewart D.CLEMENTEEN [facility] 3:51 PM 03/14/24

## 2024-03-14 ENCOUNTER — Other Ambulatory Visit (HOSPITAL_COMMUNITY): Payer: Self-pay

## 2024-03-14 ENCOUNTER — Ambulatory Visit: Admitting: Sports Medicine

## 2024-03-14 ENCOUNTER — Ambulatory Visit (INDEPENDENT_AMBULATORY_CARE_PROVIDER_SITE_OTHER): Admitting: *Deleted

## 2024-03-14 ENCOUNTER — Ambulatory Visit

## 2024-03-14 VITALS — HR 83 | Ht 64.0 in | Wt 246.0 lb

## 2024-03-14 DIAGNOSIS — M25562 Pain in left knee: Secondary | ICD-10-CM | POA: Diagnosis not present

## 2024-03-14 DIAGNOSIS — M222X2 Patellofemoral disorders, left knee: Secondary | ICD-10-CM | POA: Diagnosis not present

## 2024-03-14 DIAGNOSIS — J309 Allergic rhinitis, unspecified: Secondary | ICD-10-CM | POA: Diagnosis not present

## 2024-03-14 DIAGNOSIS — M222X1 Patellofemoral disorders, right knee: Secondary | ICD-10-CM

## 2024-03-14 DIAGNOSIS — M25561 Pain in right knee: Secondary | ICD-10-CM

## 2024-03-14 DIAGNOSIS — G8929 Other chronic pain: Secondary | ICD-10-CM | POA: Diagnosis not present

## 2024-03-14 MED ORDER — METHYLPREDNISOLONE 4 MG PO TBPK
ORAL_TABLET | ORAL | 0 refills | Status: DC
  Filled 2024-03-14: qty 21, fill #0

## 2024-03-14 MED ORDER — METHYLPREDNISOLONE 4 MG PO TBPK
ORAL_TABLET | ORAL | 0 refills | Status: DC
  Filled 2024-03-14: qty 21, 6d supply, fill #0

## 2024-03-14 NOTE — Patient Instructions (Signed)
 Prednisone  dos pak   Knee HEP   PT referral   4 week follow up

## 2024-03-16 ENCOUNTER — Ambulatory Visit (INDEPENDENT_AMBULATORY_CARE_PROVIDER_SITE_OTHER)

## 2024-03-16 DIAGNOSIS — J309 Allergic rhinitis, unspecified: Secondary | ICD-10-CM | POA: Diagnosis not present

## 2024-03-20 ENCOUNTER — Ambulatory Visit: Payer: Self-pay | Admitting: Sports Medicine

## 2024-03-20 ENCOUNTER — Ambulatory Visit: Admitting: Physician Assistant

## 2024-03-20 ENCOUNTER — Encounter: Payer: Self-pay | Admitting: Physician Assistant

## 2024-03-20 ENCOUNTER — Other Ambulatory Visit (HOSPITAL_COMMUNITY): Payer: Self-pay

## 2024-03-20 VITALS — BP 122/78 | HR 80 | Resp 95 | Ht 65.0 in | Wt 247.0 lb

## 2024-03-20 DIAGNOSIS — R1013 Epigastric pain: Secondary | ICD-10-CM | POA: Diagnosis not present

## 2024-03-20 DIAGNOSIS — R110 Nausea: Secondary | ICD-10-CM

## 2024-03-20 DIAGNOSIS — K580 Irritable bowel syndrome with diarrhea: Secondary | ICD-10-CM | POA: Diagnosis not present

## 2024-03-20 MED ORDER — AMITRIPTYLINE HCL 25 MG PO TABS
25.0000 mg | ORAL_TABLET | Freq: Every day | ORAL | 0 refills | Status: DC
  Filled 2024-03-20: qty 10, 10d supply, fill #0
  Filled 2024-03-20: qty 30, 30d supply, fill #0
  Filled 2024-03-20: qty 20, 20d supply, fill #0
  Filled 2024-03-22: qty 30, 30d supply, fill #0

## 2024-03-20 MED ORDER — SUCRALFATE 1 G PO TABS
1.0000 g | ORAL_TABLET | Freq: Three times a day (TID) | ORAL | 0 refills | Status: AC
  Filled 2024-03-20 – 2024-03-22 (×2): qty 40, 10d supply, fill #0

## 2024-03-20 NOTE — Progress Notes (Signed)
 Agree with assessment / plan as outlined.

## 2024-03-20 NOTE — Progress Notes (Signed)
 03/20/2024 [place] Huberty [id number] 1985-10-26  Referring provider: Lendia Boby CROME, NP-C Primary GI doctor: Dr. Federico  ASSESSMENT AND PLAN:  Periumblical pain x 2 years, worse during stess, worse on an empty stomach, some nausea, increase burping Negative celiac 01/2024 normal RUQ US  12/09/2023 EGD Dr. Federico normal esophagus, gastritis with hemorrhage, duodenitis, negative H. pylori negative metaplasia -Continue omeprazole  -Consider HIDA scan -Consider SIBO testing  - will do trial of carafate  - will do trial of amitriptyline   IBS diarrhea 2-3 Bms a day Negative celiac Dicyclomine makes it worse, Imodium  as needed Will do trial of amitriptyline  Consider SIBO testing/trial of xifaxin Consider switching from zoloft  to SNRI  Chairi Malformation Surgical history craniotomy and laminectomy  Patient Care Team: Lendia Boby CROME, NP-C as PCP - General (Family Medicine) Lonni Slain, MD as PCP - Cardiology (Cardiology) Luke Orlan HERO, DO as Consulting Physician (Allergy) Marne Kelly Nest, MD as Consulting Physician (Obstetrics and Gynecology)  HISTORY OF PRESENT ILLNESS: 38 y.o. female with a past medical history listed below presents for evaluation of AB pain.   Patient last seen in the office 10/21/2023 by Cathryne Beal, NP for GERD and IBS.  Discussed the use of AI scribe software for clinical note transcription with the patient, who gave verbal consent to proceed.  History of Present Illness   Kristina Stewart is a 38 year old female with chronic gastritis who presents with persistent abdominal pain.  She has been experiencing severe abdominal pain for the past two years, described as discomfort around the periumbilical area. The pain is significant enough to cause discomfort even with loose clothing and worsens during stressful situations and on an empty stomach. Attempts at intermittent fasting exacerbated the pain. The pain is daily and constant,  prompting her to eat not out of hunger but to alleviate the pain.  She underwent an endoscopy which revealed chronic gastritis with inflammation but was negative for H. pylori. She was prescribed omeprazole , initially at 40 mg for eight weeks, then reduced to 20 mg. While the medication helped, she continues to experience pain. Pepcid has been helpful, particularly after consuming certain foods like jarred sauce, which caused severe pain for four days.  She experiences nausea, increased belching, and an unsettled feeling but no vomiting. She has a history of IBS and has tried dietary modifications, including a mostly plant-based diet, reducing coffee intake, and avoiding spicy foods. She has cut down her espresso consumption significantly.  Her gallbladder was evaluated and found to be normal, with no stones, polyps, sludge, or inflammation. The pain is not located in the right upper quadrant and does not occur postprandially but rather when she is hungry. She has a family history of gallbladder issues. Fatty foods can cause diarrhea, which she previously attributed to IBS.  She has tried antispasmodics like Leveson, which worsened her symptoms, causing severe diarrhea. She has not tried Xifaxan for her IBS with diarrhea.  She is currently on Zoloft  for anxiety, which she has been taking for three and a half years. She started experiencing IBS symptoms in her twenties, which she managed with dietary changes, including a vegan diet for a year, which significantly improved her symptoms. She has two children, one of whom has special needs, contributing to her stress levels.      She  reports that she has never smoked. She has never used smokeless tobacco. She reports current alcohol use. She reports that she does not use drugs.  RELEVANT GI HISTORY, IMAGING AND  LABS: Results   RADIOLOGY Gallbladder ultrasound: Normal, no stones, polyps, sludge, or inflammation  DIAGNOSTIC REPORTS Endoscopy: Chronic  gastritis, negative for H. pylori      CBC    Component Value Date/Time   WBC 8.6 07/19/2023 0730   WBC 8.3 01/07/2022 1411   RBC 4.69 07/19/2023 0730   RBC 4.40 01/07/2022 1411   HGB 13.3 07/19/2023 0730   HGB 13.0 03/02/2012 0822   HCT 40.9 07/19/2023 0730   HCT 38.6 03/02/2012 0822   PLT 290 07/19/2023 0730   MCV 87 07/19/2023 0730   MCV 83.2 03/02/2012 0822   MCH 28.4 07/19/2023 0730   MCH 27.7 07/11/2020 1653   MCHC 32.5 07/19/2023 0730   MCHC 32.7 01/07/2022 1411   RDW 13.1 07/19/2023 0730   RDW 13.3 03/02/2012 0822   LYMPHSABS 2.9 07/19/2023 0730   LYMPHSABS 2.1 03/02/2012 0822   MONOABS 0.5 01/07/2022 1411   MONOABS 0.6 03/02/2012 0822   EOSABS 0.1 07/19/2023 0730   BASOSABS 0.1 07/19/2023 0730   BASOSABS 0.0 03/02/2012 0822   Recent Labs    07/19/23 0730  HGB 13.3    CMP     Component Value Date/Time   NA 141 07/19/2023 0730   K 4.4 07/19/2023 0730   CL 107 (H) 07/19/2023 0730   CO2 20 07/19/2023 0730   GLUCOSE 92 07/19/2023 0730   GLUCOSE 90 12/18/2021 0840   BUN 12 07/19/2023 0730   CREATININE 0.74 07/19/2023 0730   CREATININE 0.74 04/21/2019 0916   CALCIUM 9.3 07/19/2023 0730   PROT 6.3 07/19/2023 0730   ALBUMIN 3.9 07/19/2023 0730   AST 15 07/19/2023 0730   ALT 18 07/19/2023 0730   ALKPHOS 85 07/19/2023 0730   BILITOT <0.2 07/19/2023 0730   GFRNONAA >60 07/11/2020 1653   GFRNONAA 108 04/21/2019 0916   GFRAA 125 04/21/2019 0916      Latest Ref Rng & Units 07/19/2023    7:30 AM 01/07/2022    2:11 PM 12/18/2021    8:40 AM  Hepatic Function  Total Protein 6.0 - 8.5 g/dL 6.3  7.1  6.8   Albumin 3.9 - 4.9 g/dL 3.9  3.8  3.3   AST 0 - 40 IU/L 15  25  40   ALT 0 - 32 IU/L 18  32  34   Alk Phosphatase 44 - 121 IU/L 85  73  63   Total Bilirubin 0.0 - 1.2 mg/dL <9.7  0.4  0.5   Bilirubin, Direct 0.0 - 0.3 mg/dL  0.1        Current Medications:   Current Outpatient Medications (Endocrine & Metabolic):    etonogestrel -ethinyl estradiol   (NUVARING) 0.12-0.015 MG/24HR vaginal ring, Insert 1 ring vaginally as directed.   Current Outpatient Medications (Cardiovascular):    EPINEPHrine  0.3 mg/0.3 mL IJ SOAJ injection, Inject 0.3 mg into the muscle as needed for anaphylaxis.   Current Outpatient Medications (Respiratory):    albuterol  (PROVENTIL ) (2.5 MG/3ML) 0.083% nebulizer solution, Take 3 mLs (2.5 mg total) by nebulization every 6 (six) hours as needed for wheezing or shortness of breath (coughing fits).   Albuterol -Budesonide  (AIRSUPRA ) 90-80 MCG/ACT AERO, Inhale 2 puffs into the lungs every 4 (four) hours as needed (coughing, wheezing, chest tightness). Do not exceed 12 puffs in 24 hours.   Azelastine -Fluticasone  137-50 MCG/ACT SUSP, Place 1 spray into the nose in the morning and at bedtime.   budesonide  (PULMICORT ) 0.5 MG/2ML nebulizer solution, inhale 2 mLs (0.5 mg total) by nebulization in the morning  and at bedtime for 1-2 weeks during upper respiratory infections.   cetirizine (ZYRTEC) 10 MG tablet, Take 10 mg by mouth every morning.   Fluticasone -Umeclidin-Vilant (TRELEGY ELLIPTA  IN), Inhale into the lungs.   ipratropium (ATROVENT ) 0.03 % nasal spray, Place 1-2 sprays into both nostrils 2 (two) times daily as needed (nasal drainage).   ipratropium-albuterol  (DUONEB) 0.5-2.5 (3) MG/3ML SOLN, Take 3 mLs by nebulization every 4 (four) hours as needed (coughing fits, shortness of breath, wheezing).   levocetirizine (XYZAL ) 5 MG tablet, Take 1 tablet (5 mg total) by mouth every evening.   montelukast  (SINGULAIR ) 10 MG tablet, Take 1 tablet (10 mg total) by mouth at bedtime.       Current Outpatient Medications (Other):    ALPRAZolam  (XANAX ) 0.25 MG tablet, Take 0.25 mg by mouth as needed for anxiety.   amitriptyline  (ELAVIL ) 25 MG tablet, Take 1 tablet (25 mg total) by mouth at bedtime.   famotidine (PEPCID) 20 MG tablet, Take 20 mg by mouth as needed for heartburn or indigestion.   loperamide  (IMODIUM ) 2 MG capsule,  Take 1 capsule (2 mg total) by mouth every 6 (six) hours. May start with 2 capsules initially   Multiple Vitamin (MULTIVITAMIN PO), Take by mouth.   olopatadine  (PATADAY ) 0.1 % ophthalmic solution, 1 drop 2 (two) times daily.   omeprazole  (PRILOSEC) 20 MG capsule, Take 2 capsules (40 mg total) by mouth daily for 56 days, THEN 1 capsule (20 mg total) daily as needed.   sertraline  (ZOLOFT ) 50 MG tablet, Take 1 tablet (50 mg total) by mouth daily.   sucralfate  (CARAFATE ) 1 g tablet, Take 1 tablet (1 g total) by mouth 4 (four) times daily -  with meals and at bedtime for 10 days.   triamcinolone  ointment (KENALOG ) 0.1 %, Apply 1 application topically 2 (two) times daily.  Current Facility-Administered Medications (Other):    0.9 %  sodium chloride  infusion  Medical History:  Past Medical History:  Diagnosis Date   Allergy    Anxiety    Asthma    Family history of adverse reaction to anesthesia    sister problems with propofol   Fragile X chromosomal anomaly    IBS (irritable bowel syndrome)    Obesity    PAC (premature atrial contraction)    Seasonal allergic rhinitis    Strep pharyngitis    TMJ (temporomandibular joint syndrome)    Vertigo    Allergies:  Allergies  Allergen Reactions   Latex Rash and Dermatitis   Trimethoprim       Surgical History:  She  has a past surgical history that includes Brain surgery. Family History:  Her family history includes ADD / ADHD in her brother; Alzheimer's disease in her paternal grandfather; CAD in her maternal uncle; Diabetes in her father and paternal grandmother; Fibromyalgia in her paternal grandmother; Fragile X syndrome in her sister and son; GER disease in her sister; Heart disease in her father; Hyperlipidemia in her father and mother; Hypertension in her father, maternal grandmother, and mother; Irritable bowel syndrome in her father; Lung cancer in her maternal grandfather and maternal grandmother; Migraines in her son; Stroke in her  paternal grandmother; Thyroid  disease in her maternal grandmother and mother.  REVIEW OF SYSTEMS  : All other systems reviewed and negative except where noted in the History of Present Illness.  PHYSICAL EXAM: BP 122/78   Pulse 80   Resp (!) 95   Ht 5' 5 (1.651 m)   Wt 247 lb (112 kg)  LMP 03/07/2024   BMI 41.10 kg/m  Physical Exam   GENERAL APPEARANCE: Well nourished, in no apparent distress. HEENT: No cervical lymphadenopathy, unremarkable thyroid , sclerae anicteric, conjunctiva pink. RESPIRATORY: Respiratory effort normal, BS equal bilateral without rales, rhonchi, wheezing. CARDIO: RRR with no MRGs, peripheral pulses intact. ABDOMEN: Soft, non-distended, active bowel sounds in all 4 quadrants, tenderness around the umbilical region upon palpation, no tenderness in the upper abdomen, negative Carnett's sign, no rebound, no mass appreciated. RECTAL: Declines. MUSCULOSKELETAL: Full ROM, normal gait, without edema. SKIN: Dry, intact without rashes or lesions. No jaundice. NEURO: Alert, oriented, no focal deficits. PSYCH: Cooperative, normal mood and affect.      Alan JONELLE Coombs, PA-C 3:54 PM

## 2024-03-20 NOTE — Patient Instructions (Addendum)
 _______________________________________________________  If your blood pressure at your visit was 140/90 or greater, please contact your primary care physician to follow up on this.  _______________________________________________________  If you are age 36 or older, your body mass index should be between 23-30. Your Body mass index is 41.1 kg/m. If this is out of the aforementioned range listed, please consider follow up with your Primary Care Provider.  If you are age 42 or younger, your body mass index should be between 19-25. Your Body mass index is 41.1 kg/m. If this is out of the aformentioned range listed, please consider follow up with your Primary Care Provider.   ________________________________________________________  The [facility] providers would like to encourage you to use MYCHART to communicate with providers for non-urgent requests or questions.  Due to long hold times on the telephone, sending your provider a message by [facility] may be a faster and more efficient way to get a response.  Please allow 48 business hours for a response.  Please remember that this is for non-urgent requests.  _______________________________________________________  [facility] is using a team-based approach to care.  Your team is made up of your doctor and two to three APPS. Our APPS (Nurse Practitioners and Physician Assistants) work with your physician to ensure care continuity for you. They are fully qualified to address your health concerns and develop a treatment plan. They communicate directly with your gastroenterologist to care for you. Seeing the Advanced Practice Practitioners on your physician's team can help you by facilitating care more promptly, often allowing for earlier appointments, access to diagnostic testing, procedures, and other specialty referrals.    Please follow up in 2 months. Give us  a call at [phone] to schedule an appointment.   Please call us  in  October for a December appointment   Sending a medication called Carafate , this mechanically coats your stomach. Can cause constipation and darker stools. Take about 30 mins to 1 hour before food and before bed.  If the pill is too large to take you can make it more like a liquid by doing this, can dissolve it in warm water,  in small orange juice glass or shot glass and take it more as a liquid.  Elavil  (generic name: amitriptyline ) 25 mg:  Take half a pill daily at bedtime for one week, then one pill daily at bedtime and see how you do. Common side effects reported are: mouth dryness, drowsiness, confusion, dizziness.   Small intestinal bacterial overgrowth (SIBO) occurs when there is an abnormal increase in the overall bacterial population in the small intestine -- particularly types of bacteria not commonly found in that part of the digestive tract. Small intestinal bacterial overgrowth (SIBO) commonly results when a circumstance -- such as surgery or disease -- slows the passage of food and waste products in the digestive tract, creating a breeding ground for bacteria.  Look at xifaxin for the treatment of IBS-D or SIBO  Signs and symptoms of SIBO often include: Loss of appetite Abdominal pain Nausea Bloating An uncomfortable feeling of fullness after eating Diarrhea or constipation, depending on the type of gas produced  What foods trigger SIBO? While foods aren't the original cause of SIBO, certain foods do encourage the overgrowth of the wrong bacteria in your small intestine. If you're feeding them their favorite foods, they're going to grow more, and that will trigger more of your SIBO symptoms. By the same token, you can help reduce the overgrowth by starving the problematic bacteria of their  favorite foods. This strategy has led to a number of proposed SIBO eating plans. The plans vary, and so do individual results. But in general, they tend to recommend limiting carbohydrates.   These include: Sugars and sweeteners. Fruits and starchy vegetables. Dairy products. Grains.  There is a test for this we can do called a breath test, if you are positive we will treat you with an antibiotic to see if it helps.  Your symptoms are very suspicious for this condition, as discussed, we will start you on an antibiotic to see if this helps.    First do a trial off milk/lactose products if you use them.  Add fiber like benefiber or citracel once a day Increase activity Can do trial of IBGard which is over the counter for AB pain- Take 1-2 capsules once a day for maintence or twice a day during a flare    FODMAP stands for fermentable oligo-, di-, mono-saccharides and polyols (1). These are the scientific terms used to classify groups of carbs that are difficult for our body to digest and that are notorious for triggering digestive symptoms like bloating, gas, loose stools and stomach pain.   You can try low FODMAP diet  - start with eliminating just one column at a time that you feel may be a trigger for you. - the table at the very bottom contains foods that are low in FODMAPs   Sometimes trying to eliminate the FODMAP's from your diet is difficult or tricky, if you are stuggling with trying to do the elimination diet you can try an enzyme.  There is a food enzymes that you sprinkle in or on your food that helps break down the FODMAP. You can read more about the enzyme by going to this site: https://fodzyme.com/

## 2024-03-22 ENCOUNTER — Other Ambulatory Visit (HOSPITAL_COMMUNITY): Payer: Self-pay

## 2024-03-23 ENCOUNTER — Ambulatory Visit (INDEPENDENT_AMBULATORY_CARE_PROVIDER_SITE_OTHER)

## 2024-03-23 DIAGNOSIS — J309 Allergic rhinitis, unspecified: Secondary | ICD-10-CM

## 2024-03-29 DIAGNOSIS — R110 Nausea: Secondary | ICD-10-CM

## 2024-03-29 DIAGNOSIS — R1013 Epigastric pain: Secondary | ICD-10-CM

## 2024-03-29 NOTE — Telephone Encounter (Signed)
 Secure staff message sent to radiology scheduling to contact patient to set up appt. Patient notified via MyChart.

## 2024-03-29 NOTE — Telephone Encounter (Signed)
 Please schedule HIDA scan to evaluate gallbladder function.

## 2024-03-30 ENCOUNTER — Other Ambulatory Visit (HOSPITAL_COMMUNITY): Payer: Self-pay

## 2024-03-30 ENCOUNTER — Other Ambulatory Visit: Payer: Self-pay

## 2024-03-30 ENCOUNTER — Ambulatory Visit (INDEPENDENT_AMBULATORY_CARE_PROVIDER_SITE_OTHER)

## 2024-03-30 DIAGNOSIS — J309 Allergic rhinitis, unspecified: Secondary | ICD-10-CM

## 2024-03-30 DIAGNOSIS — Z1151 Encounter for screening for human papillomavirus (HPV): Secondary | ICD-10-CM | POA: Diagnosis not present

## 2024-03-30 DIAGNOSIS — Z8279 Family history of other congenital malformations, deformations and chromosomal abnormalities: Secondary | ICD-10-CM | POA: Diagnosis not present

## 2024-03-30 DIAGNOSIS — Z01419 Encounter for gynecological examination (general) (routine) without abnormal findings: Secondary | ICD-10-CM | POA: Diagnosis not present

## 2024-03-30 DIAGNOSIS — Z309 Encounter for contraceptive management, unspecified: Secondary | ICD-10-CM | POA: Diagnosis not present

## 2024-03-30 DIAGNOSIS — F419 Anxiety disorder, unspecified: Secondary | ICD-10-CM | POA: Diagnosis not present

## 2024-03-30 DIAGNOSIS — Z6841 Body Mass Index (BMI) 40.0 and over, adult: Secondary | ICD-10-CM | POA: Diagnosis not present

## 2024-03-30 DIAGNOSIS — Z124 Encounter for screening for malignant neoplasm of cervix: Secondary | ICD-10-CM | POA: Diagnosis not present

## 2024-03-30 MED ORDER — VENLAFAXINE HCL ER 37.5 MG PO CP24
37.5000 mg | ORAL_CAPSULE | Freq: Every day | ORAL | 3 refills | Status: AC
  Filled 2024-03-30: qty 90, 90d supply, fill #0

## 2024-03-30 MED ORDER — SERTRALINE HCL 25 MG PO TABS
25.0000 mg | ORAL_TABLET | Freq: Every day | ORAL | 3 refills | Status: DC
  Filled 2024-03-30: qty 90, 90d supply, fill #0

## 2024-03-30 MED ORDER — ETONOGESTREL-ETHINYL ESTRADIOL 0.12-0.015 MG/24HR VA RING
VAGINAL_RING | VAGINAL | 3 refills | Status: AC
  Filled 2024-03-30: qty 3, 28d supply, fill #0
  Filled 2024-05-29: qty 3, 84d supply, fill #0

## 2024-04-10 ENCOUNTER — Ambulatory Visit

## 2024-04-10 DIAGNOSIS — J309 Allergic rhinitis, unspecified: Secondary | ICD-10-CM

## 2024-04-11 ENCOUNTER — Other Ambulatory Visit (HOSPITAL_COMMUNITY): Payer: Self-pay

## 2024-04-11 ENCOUNTER — Ambulatory Visit: Admitting: Sports Medicine

## 2024-04-14 ENCOUNTER — Encounter (HOSPITAL_BASED_OUTPATIENT_CLINIC_OR_DEPARTMENT_OTHER): Payer: Self-pay

## 2024-04-16 ENCOUNTER — Telehealth: Admitting: Family

## 2024-04-16 DIAGNOSIS — J019 Acute sinusitis, unspecified: Secondary | ICD-10-CM

## 2024-04-16 MED ORDER — AMOXICILLIN-POT CLAVULANATE 875-125 MG PO TABS: 1.0000 | ORAL_TABLET | Freq: Two times a day (BID) | ORAL | 0 refills | Status: DC

## 2024-04-16 NOTE — Progress Notes (Signed)

## 2024-04-16 NOTE — Addendum Note (Signed)
 Addended by: LAVELL LYE A on: 04/16/2024 09:20 AM   Modules accepted: Orders

## 2024-04-17 ENCOUNTER — Encounter (HOSPITAL_BASED_OUTPATIENT_CLINIC_OR_DEPARTMENT_OTHER): Payer: Self-pay | Admitting: Cardiology

## 2024-04-17 ENCOUNTER — Ambulatory Visit (INDEPENDENT_AMBULATORY_CARE_PROVIDER_SITE_OTHER): Admitting: Cardiology

## 2024-04-17 VITALS — BP 112/72 | HR 81 | Ht 65.0 in | Wt 249.5 lb

## 2024-04-17 DIAGNOSIS — R002 Palpitations: Secondary | ICD-10-CM | POA: Diagnosis not present

## 2024-04-17 DIAGNOSIS — Z7189 Other specified counseling: Secondary | ICD-10-CM

## 2024-04-17 DIAGNOSIS — I491 Atrial premature depolarization: Secondary | ICD-10-CM | POA: Diagnosis not present

## 2024-04-17 DIAGNOSIS — R072 Precordial pain: Secondary | ICD-10-CM

## 2024-04-17 DIAGNOSIS — Z8249 Family history of ischemic heart disease and other diseases of the circulatory system: Secondary | ICD-10-CM | POA: Diagnosis not present

## 2024-04-17 DIAGNOSIS — R079 Chest pain, unspecified: Secondary | ICD-10-CM | POA: Diagnosis not present

## 2024-04-17 NOTE — Patient Instructions (Addendum)
 Medication Instructions:  No changes *If you need a refill on your cardiac medications before your next appointment, please call your pharmacy*  Lab Work: none  Testing/Procedures: Coronary CT Angiogram - instructions below  Follow-Up: At [facility], you and your health needs are our priority.  As part of our continuing mission to provide you with exceptional heart care, our providers are all part of one team.  This team includes your primary Cardiologist (physician) and Advanced Practice Providers or APPs (Physician Assistants and Nurse Practitioners) who all work together to provide you with the care you need, when you need it.  Your next appointment:   12 month(s)  Provider:   Shelda Bruckner, MD     Your cardiac CT will be scheduled at OR   [facility] [address]  [place], [place] [address]  Please enter the parking lot using the [address] entrance and use the FREE valet service at the patient drop-off area. Enter the building and check-in with registration on the main floor.   Please follow these instructions carefully (unless otherwise directed):  An IV will be required for this test and Nitroglycerin will be given.   On the Night Before the Test: Be sure to Drink plenty of water. Do not consume any caffeinated/decaffeinated beverages or chocolate 12 hours prior to your test. Do not take any antihistamines 12 hours prior to your test.  On the Day of the Test: Drink plenty of water until 1 hour prior to the test. Do not eat any food 1 hour prior to test. You may take your regular medications prior to the test.  Patients who wear a continuous glucose monitor MUST remove the device prior to scanning. FEMALES- please wear underwire-free bra if available, avoid dresses & tight clothing  After the Test: Drink plenty of water. After receiving IV contrast, you may experience a mild flushed feeling. This is  normal. On occasion, you may experience a mild rash up to 24 hours after the test. This is not dangerous. If this occurs, you can take Benadryl  25 mg, Zyrtec, Claritin, or Allegra and increase your fluid intake. (Patients taking Tikosyn should avoid Benadryl , and may take Zyrtec, Claritin, or Allegra) If you experience trouble breathing, this can be serious. If it is severe call 911 IMMEDIATELY. If it is mild, please call our office.  We will call to schedule your test 2-4 weeks out understanding that some insurance companies will need an authorization prior to the service being performed.   For more information and frequently asked questions, please visit our website : [email or web address]  For non-scheduling related questions, please contact the cardiac imaging nurse navigator should you have any questions/concerns: Cardiac Imaging Nurse Navigators Direct Office Dial: [phone]   For scheduling needs, including cancellations and rescheduling, please call [place], [phone].

## 2024-04-17 NOTE — Progress Notes (Signed)
 Cardiology Office Note:  .   Date:  04/17/2024  ID:  Kristina Stewart, DOB [birth year], MRN [id number] PCP: Lendia Boby CROME, NP-C  [facility] Providers Cardiologist:  Shelda Bruckner, MD {  History of Present Illness: Kristina Stewart   Kristina Stewart is a 38 y.o. female with PMH PACs/palpitations. I met her 10/14/2022.  Pertinent CV history: Palpitations since she was a teenager, with significant symptoms several times/year. FH: Mother has bradycardia, maternal uncle diagnosed with ARVD. Sister has fragile-x syndrome, father has diabetes, first degree relative has CAD   Today: She has been having chest pain. She is concerned as her fragile X mutation has been associated with SCAD. Pain is sharp, central, nonradiating. Happens about once/week, mostly during times of stress.   Has occasional PACs/palpitations but not severe. Reviewed ECGs on apple phone.   Since our last visit, her father had an MI in his PAD and was stented.  ROS: Denies shortness of breath at rest or with normal exertion. No PND, orthopnea, LE edema or unexpected weight gain. No syncope. ROS otherwise negative except as noted.   Studies Reviewed: Kristina Stewart    EKG:  EKG Interpretation Date/Time:  Monday April 17 2024 09:05:00 EDT Ventricular Rate:  82 PR Interval:  132 QRS Duration:  76 QT Interval:  394 QTC Calculation: 460 R Axis:   40  Text Interpretation: Normal sinus rhythm with sinus arrhythmia Normal ECG Confirmed by Bruckner Shelda [phone]) on 04/17/2024 9:21:46 AM    Physical Exam:   VS:  BP 112/72   Pulse 81   Ht 5' 5 (1.651 m)   Wt 249 lb 8 oz (113.2 kg)   SpO2 99%   BMI 41.52 kg/m    Wt Readings from Last 3 Encounters:  04/17/24 249 lb 8 oz (113.2 kg)  03/20/24 247 lb (112 kg)  03/14/24 246 lb (111.6 kg)    GEN: Well nourished, well developed in no acute distress HEENT: Normal, moist mucous membranes NECK: No JVD CARDIAC: regular rhythm, normal S1 and S2, no rubs or gallops. No  murmur. VASCULAR: Radial and DP pulses 2+ bilaterally. No carotid bruits RESPIRATORY:  Clear to auscultation without rales, wheezing or rhonchi  ABDOMEN: Soft, non-tender, non-distended MUSCULOSKELETAL:  Ambulates independently SKIN: Warm and dry, no edema NEUROLOGIC:  Alert and oriented x 3. No focal neuro deficits noted. PSYCHIATRIC:  Normal affect    ASSESSMENT AND PLAN: .    Chest pain -discussed potential etiologies, including cardiac, GI, MSK -father with recent MI, so family history as risk factor -options for further evaluation discussed. After shared decision making, will proceed with coronary CT -reviewed HR range, typically around 70, with borderline BP will hold on metoprolol -discussed IV contrast and need for nitroglycerin -reviewed red flag warning signs that need immediate medical attention, what SCAD would present as, what to watch for  PACs Palpitations -no high risk features at this time -has maternal uncle with ARVC -reviewed red flag warning signs that need immediate medical attention -she will contact me for worsening symptoms   Family history of heart disease:  -maternal uncle with ARVC -sister has Fragile X -dad had MI in his LAD  CV risk counseling and prevention -recommend heart healthy/Mediterranean diet, with whole grains, fruits, vegetable, fish, lean meats, nuts, and olive oil. Limit salt. -recommend moderate walking, 3-5 times/week for 30-50 minutes each session. Aim for at least 150 minutes/week. Goal should be pace of 3 miles/hours, or walking 1.5 miles in 30 minutes -recommend avoidance of tobacco products.  Avoid excess alcohol. -ASCVD risk score: The ASCVD Risk score (Arnett DK, et al., 2019) failed to calculate for the following reasons:   The 2019 ASCVD risk score is only valid for ages 22 to 75    Dispo: if testing unremarkable, follow up in year  Signed, Shelda Bruckner, MD   Shelda Bruckner, MD, PhD, [facility] [place]   [facility]  [place]  [facility] [address], [address] [place], [place] [address] [phone]

## 2024-04-25 ENCOUNTER — Encounter (HOSPITAL_COMMUNITY): Payer: Self-pay

## 2024-04-27 ENCOUNTER — Ambulatory Visit (HOSPITAL_COMMUNITY): Admission: RE | Admit: 2024-04-27 | Source: Ambulatory Visit

## 2024-05-01 ENCOUNTER — Ambulatory Visit

## 2024-05-01 DIAGNOSIS — J309 Allergic rhinitis, unspecified: Secondary | ICD-10-CM

## 2024-05-04 ENCOUNTER — Encounter: Payer: Self-pay | Admitting: Allergy

## 2024-05-04 ENCOUNTER — Ambulatory Visit (HOSPITAL_COMMUNITY)

## 2024-05-04 ENCOUNTER — Other Ambulatory Visit (HOSPITAL_COMMUNITY): Payer: Self-pay

## 2024-05-04 MED ORDER — TRELEGY ELLIPTA 200-62.5-25 MCG/ACT IN AEPB
1.0000 | INHALATION_SPRAY | Freq: Every day | RESPIRATORY_TRACT | 5 refills | Status: AC
  Filled 2024-05-04: qty 60, 30d supply, fill #0
  Filled 2024-05-30: qty 60, 30d supply, fill #1

## 2024-05-06 ENCOUNTER — Telehealth: Admitting: Family Medicine

## 2024-05-06 ENCOUNTER — Telehealth: Admitting: Physician Assistant

## 2024-05-06 DIAGNOSIS — J019 Acute sinusitis, unspecified: Secondary | ICD-10-CM

## 2024-05-06 NOTE — Progress Notes (Signed)
 Virtual Visit Consent   Kristina Stewart, you are scheduled for a virtual visit with a [facility] provider today. Just as with appointments in the office, your consent must be obtained to participate. Your consent will be active for this visit and any virtual visit you may have with one of our providers in the next 365 days. If you have a MyChart account, a copy of this consent can be sent to you electronically.  As this is a virtual visit, video technology does not allow for your provider to perform a traditional examination. This may limit your provider's ability to fully assess your condition. If your provider identifies any concerns that need to be evaluated in person or the need to arrange testing (such as labs, EKG, etc.), we will make arrangements to do so. Although advances in technology are sophisticated, we cannot ensure that it will always work on either your end or our end. If the connection with a video visit is poor, the visit may have to be switched to a telephone visit. With either a video or telephone visit, we are not always able to ensure that we have a secure connection.  By engaging in this virtual visit, you consent to the provision of healthcare and authorize for your insurance to be billed (if applicable) for the services provided during this visit. Depending on your insurance coverage, you may receive a charge related to this service.  I need to obtain your verbal consent now. Are you willing to proceed with your visit today? Kristina Stewart has provided verbal consent on 05/06/2024 for a virtual visit (video or telephone). Kristina Stewart, [place]  Date: 05/06/2024 10:27 AM   Virtual Visit via Video Note   I, Kristina Stewart, connected with  Kristina Stewart  ([id number], 1985-08-02) on 05/06/24 at 10:15 AM EST by a video-enabled telemedicine application and verified that I am speaking with the correct person using two identifiers.  Location: Patient: Virtual Visit Location  Patient: Home Provider: Virtual Visit Location Provider: Home Office   I discussed the limitations of evaluation and management by telemedicine and the availability of in person appointments. The patient expressed understanding and agreed to proceed.    History of Present Illness: Kristina Stewart is a 38 y.o. who identifies as a female who was assigned female at birth, and is being seen today for ongoing sinus infection.  HPI: Sinusitis The current episode started 1 to 4 weeks ago. The problem has been gradually worsening since onset. There has been no fever. Associated symptoms include congestion, coughing and sinus pressure. Pertinent negatives include no sneezing. Past treatments include antibiotics. The treatment provided mild relief.    Problems:  Patient Active Problem List   Diagnosis Date Noted   Severe obesity (BMI >= 40) (HCC) 07/14/2023   Family history of Hashimoto thyroiditis 07/14/2023   Pure hypercholesterolemia 07/14/2023   Vitamin D  deficiency 07/14/2023   Gastroesophageal reflux disease 07/14/2023   Intermittent abdominal pain 01/28/2022   Epigastric abdominal tenderness without rebound tenderness 01/07/2022   Fragile X syndrome 12/18/2021   Irregular periods 12/18/2021   Fatigue 12/18/2021   Acute cystitis with hematuria 12/18/2021   Overuse syndrome of hand 03/24/2021   Encounter for general adult medical examination with abnormal findings 03/24/2021   Iron deficiency anemia due to chronic blood loss 03/24/2021   Moderate persistent asthma without complication 11/14/2020   Normal labor and delivery 07/09/2020   Pregnancy 11/09/2019   Seasonal and perennial allergic rhinoconjunctivitis 09/11/2019   Pollen-food allergy 06/12/2019  Dyshidrotic eczema 06/12/2019   Family history of fragile X syndrome 03/25/2016   History of frequent upper respiratory infection 04/26/2012   MTHFR mutation 04/01/2012   Homozygous MTHFR mutation [id number] 01/18/2012    Allergies:   Allergies  Allergen Reactions   Latex Rash and Dermatitis   Trimethoprim     Medications:  Current Outpatient Medications:    albuterol  (PROVENTIL ) (2.5 MG/3ML) 0.083% nebulizer solution, Take 3 mLs (2.5 mg total) by nebulization every 6 (six) hours as needed for wheezing or shortness of breath (coughing fits)., Disp: 75 mL, Rfl: 2   Albuterol -Budesonide  (AIRSUPRA ) 90-80 MCG/ACT AERO, Inhale 2 puffs into the lungs every 4 (four) hours as needed (coughing, wheezing, chest tightness). Do not exceed 12 puffs in 24 hours., Disp: 10.7 g, Rfl: 2   ALPRAZolam  (XANAX ) 0.25 MG tablet, Take 0.25 mg by mouth as needed for anxiety., Disp: , Rfl:    amoxicillin -clavulanate (AUGMENTIN ) 875-125 MG tablet, Take 1 tablet by mouth 2 (two) times daily., Disp: 14 tablet, Rfl: 0   Azelastine -Fluticasone  137-50 MCG/ACT SUSP, Place 1 spray into the nose in the morning and at bedtime., Disp: 23 g, Rfl: 5   budesonide  (PULMICORT ) 0.5 MG/2ML nebulizer solution, inhale 2 mLs (0.5 mg total) by nebulization in the morning and at bedtime for 1-2 weeks during upper respiratory infections., Disp: 120 mL, Rfl: 0   cetirizine (ZYRTEC) 10 MG tablet, Take 10 mg by mouth every morning., Disp: , Rfl:    EPINEPHrine  0.3 mg/0.3 mL IJ SOAJ injection, Inject 0.3 mg into the muscle as needed for anaphylaxis., Disp: 2 each, Rfl: 1   etonogestrel -ethinyl estradiol  (NUVARING) 0.12-0.015 MG/24HR vaginal ring, Insert 1 vaginal ring by vaginal route., Disp: 3 each, Rfl: 3   famotidine (PEPCID) 20 MG tablet, Take 20 mg by mouth as needed for heartburn or indigestion., Disp: , Rfl:    Fluticasone -Umeclidin-Vilant (TRELEGY ELLIPTA ) 200-62.5-25 MCG/ACT AEPB, Inhale 1 puff into the lungs daily. Rinse mouth after each use., Disp: 60 each, Rfl: 5   ipratropium (ATROVENT ) 0.03 % nasal spray, Place 1-2 sprays into both nostrils 2 (two) times daily as needed (nasal drainage)., Disp: 30 mL, Rfl: 5   ipratropium-albuterol  (DUONEB) 0.5-2.5 (3) MG/3ML SOLN,  Take 3 mLs by nebulization every 4 (four) hours as needed (coughing fits, shortness of breath, wheezing)., Disp: 180 mL, Rfl: 2   levocetirizine (XYZAL ) 5 MG tablet, Take 1 tablet (5 mg total) by mouth every evening., Disp: 90 tablet, Rfl: 3   loperamide  (IMODIUM ) 2 MG capsule, Take 1 capsule (2 mg total) by mouth every 6 (six) hours. May start with 2 capsules initially, Disp: 30 capsule, Rfl: 0   montelukast  (SINGULAIR ) 10 MG tablet, Take 1 tablet (10 mg total) by mouth at bedtime., Disp: 90 tablet, Rfl: 3   Multiple Vitamin (MULTIVITAMIN PO), Take by mouth., Disp: , Rfl:    olopatadine  (PATADAY ) 0.1 % ophthalmic solution, 1 drop 2 (two) times daily., Disp: , Rfl:    omeprazole  (PRILOSEC) 20 MG capsule, Take 2 capsules (40 mg total) by mouth daily for 56 days, THEN 1 capsule (20 mg total) daily as needed. (Patient not taking: No sig reported), Disp: 168 capsule, Rfl: 0   sucralfate  (CARAFATE ) 1 g tablet, Take 1 tablet (1 g total) by mouth 4 (four) times daily -  with meals and at bedtime for 10 days., Disp: 40 tablet, Rfl: 0   triamcinolone  ointment (KENALOG ) 0.1 %, Apply 1 application topically 2 (two) times daily., Disp: 30 g, Rfl: 2   venlafaxine  XR (EFFEXOR-XR) 37.5 MG 24 hr capsule, Take 1 capsule (37.5 mg total) by mouth daily., Disp: 90 capsule, Rfl: 3  Current Facility-Administered Medications:    0.9 %  sodium chloride  infusion, 500 mL, Intravenous, Once, Federico Rosario BROCKS, MD  Observations/Objective: Patient is well-developed, well-nourished in no acute distress.  Resting comfortably  at home.  Head is normocephalic, atraumatic.  No labored breathing.  Speech is clear and coherent with logical content.  Patient is alert and oriented at baseline.    Assessment and Plan: 1. Acute sinusitis, recurrence not specified, unspecified location (Primary)  Counseled patient that due to ongoing symptoms without improvement, its best that she present in person for evaluation. She verbalized  understanding and agreed to present to the nearest urgent care.   Follow Up Instructions: I discussed the assessment and treatment plan with the patient. The patient was provided an opportunity to ask questions and all were answered. The patient agreed with the plan and demonstrated an understanding of the instructions.  A copy of instructions were sent to the patient via MyChart unless otherwise noted below.    The patient was advised to call back or seek an in-person evaluation if the symptoms worsen or if the condition fails to improve as anticipated.    Kristina Shuck, PA-C

## 2024-05-06 NOTE — Progress Notes (Signed)
   Thank you for the details you included in the comment boxes. Those details are very helpful in determining the best course of treatment for you and help us  to provide the best care.Because of your current symptoms, we recommend that you schedule a Virtual Urgent Care video visit in order for the provider to better assess what is going on.  The provider will be able to give you a more accurate diagnosis and treatment plan if we can more freely discuss your symptoms and with the addition of a virtual examination.   If you change your visit to a video visit, we will bill your insurance (similar to an office visit) and you will not be charged for this e-Visit. You will be able to stay at home and speak with the first available [name] advanced practice provider. The link to do a video visit is in the drop down Menu tab of your Welcome screen in MyChart.    I have spent 5 minutes in review of e-visit questionnaire, review and updating patient chart, medical decision making and response to patient.   Kristina Mater, PA-C

## 2024-05-06 NOTE — Patient Instructions (Signed)
 Kristina Stewart, thank you for joining [name], PA-C for today's virtual visit.  While this provider is not your primary care provider (PCP), if your PCP is located in our provider database this encounter information will be shared with them immediately following your visit.   A [facility] MyChart account gives you access to today's visit and all your visits, tests, and labs performed at [facility]  click here if you don't have a [facility] MyChart account or go to [email or web address]  Consent: (Patient) Kristina Stewart provided verbal consent for this virtual visit at the beginning of the encounter.  Current Medications:  Current Outpatient Medications:    albuterol  (PROVENTIL ) (2.5 MG/3ML) 0.083% nebulizer solution, Take 3 mLs (2.5 mg total) by nebulization every 6 (six) hours as needed for wheezing or shortness of breath (coughing fits)., Disp: 75 mL, Rfl: 2   Albuterol -Budesonide  (AIRSUPRA ) 90-80 MCG/ACT AERO, Inhale 2 puffs into the lungs every 4 (four) hours as needed (coughing, wheezing, chest tightness). Do not exceed 12 puffs in 24 hours., Disp: 10.7 g, Rfl: 2   ALPRAZolam  (XANAX ) 0.25 MG tablet, Take 0.25 mg by mouth as needed for anxiety., Disp: , Rfl:    amoxicillin -clavulanate (AUGMENTIN ) 875-125 MG tablet, Take 1 tablet by mouth 2 (two) times daily., Disp: 14 tablet, Rfl: 0   Azelastine -Fluticasone  137-50 MCG/ACT SUSP, Place 1 spray into the nose in the morning and at bedtime., Disp: 23 g, Rfl: 5   budesonide  (PULMICORT ) 0.5 MG/2ML nebulizer solution, inhale 2 mLs (0.5 mg total) by nebulization in the morning and at bedtime for 1-2 weeks during upper respiratory infections., Disp: 120 mL, Rfl: 0   cetirizine (ZYRTEC) 10 MG tablet, Take 10 mg by mouth every morning., Disp: , Rfl:    EPINEPHrine  0.3 mg/0.3 mL IJ SOAJ injection, Inject 0.3 mg into the muscle as needed for anaphylaxis., Disp: 2 each, Rfl: 1   etonogestrel -ethinyl estradiol  (NUVARING)  0.12-0.015 MG/24HR vaginal ring, Insert 1 vaginal ring by vaginal route., Disp: 3 each, Rfl: 3   famotidine (PEPCID) 20 MG tablet, Take 20 mg by mouth as needed for heartburn or indigestion., Disp: , Rfl:    Fluticasone -Umeclidin-Vilant (TRELEGY ELLIPTA ) 200-62.5-25 MCG/ACT AEPB, Inhale 1 puff into the lungs daily. Rinse mouth after each use., Disp: 60 each, Rfl: 5   ipratropium (ATROVENT ) 0.03 % nasal spray, Place 1-2 sprays into both nostrils 2 (two) times daily as needed (nasal drainage)., Disp: 30 mL, Rfl: 5   ipratropium-albuterol  (DUONEB) 0.5-2.5 (3) MG/3ML SOLN, Take 3 mLs by nebulization every 4 (four) hours as needed (coughing fits, shortness of breath, wheezing)., Disp: 180 mL, Rfl: 2   levocetirizine (XYZAL ) 5 MG tablet, Take 1 tablet (5 mg total) by mouth every evening., Disp: 90 tablet, Rfl: 3   loperamide  (IMODIUM ) 2 MG capsule, Take 1 capsule (2 mg total) by mouth every 6 (six) hours. May start with 2 capsules initially, Disp: 30 capsule, Rfl: 0   montelukast  (SINGULAIR ) 10 MG tablet, Take 1 tablet (10 mg total) by mouth at bedtime., Disp: 90 tablet, Rfl: 3   Multiple Vitamin (MULTIVITAMIN PO), Take by mouth., Disp: , Rfl:    olopatadine  (PATADAY ) 0.1 % ophthalmic solution, 1 drop 2 (two) times daily., Disp: , Rfl:    omeprazole  (PRILOSEC) 20 MG capsule, Take 2 capsules (40 mg total) by mouth daily for 56 days, THEN 1 capsule (20 mg total) daily as needed. (Patient not taking: No sig reported), Disp: 168 capsule, Rfl: 0   sucralfate  (CARAFATE ) 1 g tablet,  Take 1 tablet (1 g total) by mouth 4 (four) times daily -  with meals and at bedtime for 10 days., Disp: 40 tablet, Rfl: 0   triamcinolone  ointment (KENALOG ) 0.1 %, Apply 1 application topically 2 (two) times daily., Disp: 30 g, Rfl: 2   venlafaxine XR (EFFEXOR-XR) 37.5 MG 24 hr capsule, Take 1 capsule (37.5 mg total) by mouth daily., Disp: 90 capsule, Rfl: 3  Current Facility-Administered Medications:    0.9 %  sodium chloride   infusion, 500 mL, Intravenous, Once, Dorsey, Ying C, MD   Medications ordered in this encounter:  No orders of the defined types were placed in this encounter.    *If you need refills on other medications prior to your next appointment, please contact your pharmacy*  Follow-Up: Call back or seek an in-person evaluation if the symptoms worsen or if the condition fails to improve as anticipated.  [facility] [phone]  Other Instructions Report to urgent care for evaluation.   If you have been instructed to have an in-person evaluation today at a local Urgent Care facility, please use the link below. It will take you to a list of all of our available [facility], including address, phone number and hours of operation. Please do not delay care.  [facility]  If you or a family member do not have a primary care provider, use the link below to schedule a visit and establish care. When you choose a [facility] primary care physician or advanced practice provider, you gain a long-term partner in health. Find a Primary Care Provider  Learn more about [facility]'s in-office and virtual care options: [facility] - Get Care Now

## 2024-05-08 ENCOUNTER — Encounter (HOSPITAL_COMMUNITY): Payer: Self-pay

## 2024-05-09 ENCOUNTER — Ambulatory Visit

## 2024-05-09 DIAGNOSIS — J309 Allergic rhinitis, unspecified: Secondary | ICD-10-CM

## 2024-05-10 ENCOUNTER — Ambulatory Visit (HOSPITAL_COMMUNITY)
Admission: RE | Admit: 2024-05-10 | Discharge: 2024-05-10 | Disposition: A | Discharge status: Home / Self Care | Source: Ambulatory Visit | Attending: Internal Medicine | Admitting: Internal Medicine

## 2024-05-10 ENCOUNTER — Other Ambulatory Visit (HOSPITAL_COMMUNITY): Payer: Self-pay

## 2024-05-10 ENCOUNTER — Telehealth (HOSPITAL_COMMUNITY): Payer: Self-pay | Admitting: Emergency Medicine

## 2024-05-10 DIAGNOSIS — R079 Chest pain, unspecified: Secondary | ICD-10-CM | POA: Diagnosis not present

## 2024-05-10 DIAGNOSIS — R072 Precordial pain: Secondary | ICD-10-CM | POA: Insufficient documentation

## 2024-05-10 MED ORDER — DILTIAZEM HCL 25 MG/5ML IV SOLN
10.0000 mg | INTRAVENOUS | Status: DC | PRN
  Administered 2024-05-10: 10 mg via INTRAVENOUS

## 2024-05-10 MED ORDER — IVABRADINE HCL 5 MG PO TABS
10.0000 mg | ORAL_TABLET | Freq: Once | ORAL | 0 refills | Status: AC
  Filled 2024-05-10: qty 2, 1d supply, fill #0

## 2024-05-10 MED ORDER — IOHEXOL 350 MG/ML SOLN
100.0000 mL | Freq: Once | INTRAVENOUS | Status: AC | PRN
  Administered 2024-05-10: 100 mL via INTRAVENOUS

## 2024-05-10 MED ORDER — NITROGLYCERIN 0.4 MG SL SUBL
0.8000 mg | SUBLINGUAL_TABLET | Freq: Once | SUBLINGUAL | Status: AC
  Administered 2024-05-10: 0.8 mg via SUBLINGUAL

## 2024-05-10 NOTE — Telephone Encounter (Signed)
 Pt calling to report HR 130 . Ivabradine prescribed for HR control Kristina Stewart

## 2024-05-11 ENCOUNTER — Other Ambulatory Visit (HOSPITAL_COMMUNITY): Payer: Self-pay

## 2024-05-11 ENCOUNTER — Encounter: Payer: Self-pay | Admitting: Family Medicine

## 2024-05-11 ENCOUNTER — Ambulatory Visit: Admitting: Family Medicine

## 2024-05-11 VITALS — BP 132/84 | HR 95 | Temp 98.5°F | Ht 65.0 in | Wt 247.4 lb

## 2024-05-11 DIAGNOSIS — J0111 Acute recurrent frontal sinusitis: Secondary | ICD-10-CM

## 2024-05-11 MED ORDER — LEVOFLOXACIN 500 MG PO TABS
500.0000 mg | ORAL_TABLET | Freq: Every day | ORAL | 0 refills | Status: DC
  Filled 2024-05-11: qty 7, 7d supply, fill #0

## 2024-05-11 MED ORDER — LEVOFLOXACIN 500 MG PO TABS: 500.0000 mg | ORAL_TABLET | Freq: Every day | ORAL | 0 refills | Status: AC

## 2024-05-11 MED ORDER — PREDNISONE 20 MG PO TABS
40.0000 mg | ORAL_TABLET | Freq: Every day | ORAL | 0 refills | Status: DC
  Filled 2024-05-11: qty 10, 5d supply, fill #0

## 2024-05-11 MED ORDER — PREDNISONE 20 MG PO TABS: 40.0000 mg | ORAL_TABLET | Freq: Every day | ORAL | 0 refills | Status: AC

## 2024-05-11 NOTE — Patient Instructions (Signed)
 I have sent in Levaquin  500 mg for you to take once daily for 7 days.  If over the weekend, symptoms of not beginning to improve, I have also sent in a prednisone  prescription for you to take 2 tablets once daily in the morning for 5 days.  I am hoping you feel so much better  Follow-up with me for new or worsening symptoms.

## 2024-05-11 NOTE — Progress Notes (Signed)
 Acute Office Visit  Subjective:     Patient ID: Kristina Stewart, female    DOB: May 04, 1986, 38 y.o.   MRN: [id number]  Chief Complaint  Patient presents with   Nasal Congestion    HPI  Discussed the use of AI scribe software for clinical note transcription with the patient, who gave verbal consent to proceed.  History of Present Illness Kristina Stewart is a 38 year old female with allergy-induced asthma who presents with persistent sinus congestion and facial pain.  Sinus congestion and facial pain - Sinus congestion and facial pain have persisted since October 9th. - Initial severe facial pain improved after a seven-day course of Augmentin . - Congestion continues with green nasal discharge and sensation of complete blockage for two days. - Afrin provides temporary relief of congestion. - History of recurrent sinus infections, with previous need to switch from Augmentin  or Amoxicillin  to Levaquin . - No known allergies to sulfa or other antibiotics.  Productive cough and post-nasal drainage - Productive cough attributed to post-nasal drainage. - Sensation of pressure and clogging in the ear.  Allergy-induced asthma - Asthma managed with nebulizer treatments using Ipratropium-Albuterol  (DuoNeb) and Budesonide  (Pulmicort ). - Recent coronary CT scan prevented use of antihistamines.  Recent family illness - Family members, including children, recently experienced similar symptoms. - Children recovered after antibiotic treatment. - She remains symptomatic while her family has recovered.     ROS Per HPI      Objective:    BP 132/84 (BP Location: Left Arm, Patient Position: Sitting)   Pulse 95   Temp 98.5 F (36.9 C) (Temporal)   Ht 5' 5 (1.651 m)   Wt 247 lb 6.4 oz (112.2 kg)   SpO2 97%   BMI 41.17 kg/m    Physical Exam Vitals and nursing note reviewed.  Constitutional:      General: She is not in acute distress.    Appearance: Normal appearance.   HENT:     Head: Normocephalic and atraumatic.     Right Ear: External ear normal. A middle ear effusion is present.     Left Ear: External ear normal.     Nose: Congestion present.     Right Sinus: Frontal sinus tenderness present.     Left Sinus: Frontal sinus tenderness present.     Mouth/Throat:     Mouth: Mucous membranes are moist.     Comments: Oropharyngeal cobblestoning   Eyes:     Extraocular Movements: Extraocular movements intact.     Pupils: Pupils are equal, round, and reactive to light.  Cardiovascular:     Rate and Rhythm: Normal rate and regular rhythm.     Pulses: Normal pulses.     Heart sounds: Normal heart sounds.  Pulmonary:     Effort: Pulmonary effort is normal. No respiratory distress.     Breath sounds: Normal breath sounds. No wheezing, rhonchi or rales.  Musculoskeletal:        General: Normal range of motion.     Cervical back: Normal range of motion.     Right lower leg: No edema.     Left lower leg: No edema.  Lymphadenopathy:     Cervical: No cervical adenopathy.  Neurological:     General: No focal deficit present.     Mental Status: She is alert and oriented to person, place, and time.  Psychiatric:        Mood and Affect: Mood normal.        Thought Content: Thought content  normal.     No results found for any visits on 05/11/24.      Assessment & Plan:   Assessment and Plan Assessment & Plan Acute recurrent frontal sinusitis Persistent symptoms despite previous Augmentin  treatment. Current symptoms include nasal congestion, pressure, and productive cough due to drainage. No ear infection. Previous antibiotics ineffective. No sulfa or other antibiotic allergies. Recent steroid use in September. - Prescribed Levaquin  500 mg once daily for 5 days. - Sent prescription to on [facility] for 24/7 access. - Advised to monitor symptoms and report if no improvement after 2 days of Levaquin . - Discussed potential need for additional steroid  treatment if symptoms persist.     No orders of the defined types were placed in this encounter.    Meds ordered this encounter  Medications   DISCONTD: levofloxacin  (LEVAQUIN ) 500 MG tablet    Sig: Take 1 tablet (500 mg total) by mouth daily for 7 days.    Dispense:  7 tablet    Refill:  0   DISCONTD: predniSONE  (DELTASONE ) 20 MG tablet    Sig: Take 2 tablets (40 mg total) by mouth daily for 5 days.    Dispense:  10 tablet    Refill:  0   predniSONE  (DELTASONE ) 20 MG tablet    Sig: Take 2 tablets (40 mg total) by mouth daily for 5 days.    Dispense:  10 tablet    Refill:  0   levofloxacin  (LEVAQUIN ) 500 MG tablet    Sig: Take 1 tablet (500 mg total) by mouth daily for 7 days.    Dispense:  7 tablet    Refill:  0    Return if symptoms worsen or fail to improve.  Corean LITTIE Ku, FNP

## 2024-05-12 ENCOUNTER — Ambulatory Visit (HOSPITAL_BASED_OUTPATIENT_CLINIC_OR_DEPARTMENT_OTHER): Payer: Self-pay | Admitting: Cardiology

## 2024-05-15 ENCOUNTER — Encounter: Payer: Self-pay | Admitting: Family Medicine

## 2024-05-29 ENCOUNTER — Encounter (HOSPITAL_COMMUNITY): Payer: Self-pay

## 2024-05-29 ENCOUNTER — Other Ambulatory Visit (HOSPITAL_COMMUNITY): Payer: Self-pay

## 2024-05-29 ENCOUNTER — Ambulatory Visit

## 2024-05-29 ENCOUNTER — Encounter: Payer: Self-pay | Admitting: Allergy

## 2024-05-29 ENCOUNTER — Other Ambulatory Visit: Payer: Self-pay | Admitting: Allergy

## 2024-05-29 ENCOUNTER — Other Ambulatory Visit: Payer: Self-pay

## 2024-05-29 DIAGNOSIS — J309 Allergic rhinitis, unspecified: Secondary | ICD-10-CM

## 2024-05-29 MED ORDER — MONTELUKAST SODIUM 10 MG PO TABS
10.0000 mg | ORAL_TABLET | Freq: Every day | ORAL | 0 refills | Status: AC
  Filled 2024-05-29: qty 90, 90d supply, fill #0

## 2024-05-30 ENCOUNTER — Other Ambulatory Visit (HOSPITAL_COMMUNITY): Payer: Self-pay

## 2024-06-07 ENCOUNTER — Ambulatory Visit: Admitting: "Endocrinology

## 2024-06-15 ENCOUNTER — Ambulatory Visit

## 2024-06-15 ENCOUNTER — Encounter: Payer: Self-pay | Admitting: Family Medicine

## 2024-06-15 ENCOUNTER — Ambulatory Visit: Admitting: Family Medicine

## 2024-06-15 DIAGNOSIS — E7800 Pure hypercholesterolemia, unspecified: Secondary | ICD-10-CM

## 2024-06-15 DIAGNOSIS — J4540 Moderate persistent asthma, uncomplicated: Secondary | ICD-10-CM

## 2024-06-15 DIAGNOSIS — J309 Allergic rhinitis, unspecified: Secondary | ICD-10-CM | POA: Diagnosis not present

## 2024-06-15 DIAGNOSIS — Z6841 Body Mass Index (BMI) 40.0 and over, adult: Secondary | ICD-10-CM

## 2024-06-15 DIAGNOSIS — E559 Vitamin D deficiency, unspecified: Secondary | ICD-10-CM | POA: Diagnosis not present

## 2024-06-15 NOTE — Progress Notes (Signed)
 Subjective:     Patient ID: Kristina Stewart, female    DOB: Feb 17, 1986, 38 y.o.   MRN: [id number]  Chief Complaint  Patient presents with   Follow-up    Would like to discuss glp1    HPI  Discussed the use of AI scribe software for clinical note transcription with the patient, who gave verbal consent to proceed.  History of Present Illness Kristina Stewart is a 38 year old female who presents to discuss starting GLP-1 therapy out of pocket.  Interest in glp-1 receptor agonist therapy - Desires to start GLP-1 therapy, specifically Zepbound, due to lack of insurance coverage - No history of pancreatitis, constipation, or thyroid  cancer - Not pregnant or breastfeeding - Uses NuvaRing for contraception  Gastrointestinal symptoms - History of upper abdominal symptoms resolved with brief course of Carafate  - Upper endoscopy performed over the summer with no significant findings - HIDA scan not pursued after symptom resolution - Irritable bowel syndrome with diarrhea (IBS-D), usually well-controlled - Recent IBS-D flare associated with increased stress related to caregiving responsibilities - No current constipation  Endocrine and metabolic conditions - Low vitamin D , managed with multivitamin supplementation - Elevated cholesterol - Previous trial of metformin  resulted in severe diarrhea  Sleep and respiratory symptoms - No known sleep apnea  Psychosocial stressors - Works as a education officer, environmental - Primary caregiver for youngest son with autism, contributing to increased stress     Health Maintenance Due  Topic Date Due   Hepatitis B Vaccines 19-59 Average Risk (1 of 3 - 19+ 3-dose series) Never done   HPV VACCINES (1 - 3-dose SCDM series) Never done   Cervical Cancer Screening (HPV/Pap Cotest)  04/18/2023   Influenza Vaccine  01/28/2024   COVID-19 Vaccine (5 - 2025-26 season) 02/28/2024    Past Medical History:  Diagnosis Date   Allergy    Anxiety    Asthma     Family history of adverse reaction to anesthesia    sister problems with propofol   Fragile X chromosomal anomaly    IBS (irritable bowel syndrome)    Obesity    PAC (premature atrial contraction)    Seasonal allergic rhinitis    Strep pharyngitis    TMJ (temporomandibular joint syndrome)    Vertigo     Past Surgical History:  Procedure Laterality Date   BRAIN SURGERY     craniotomy/arnold chiari malformation; craniotomy & laminectomy   SPINE SURGERY  10/2004   Laminectomy for chiari Malformation    Family History  Problem Relation Age of Onset   Hypertension Mother    Thyroid  disease Mother    Hyperlipidemia Mother    Obesity Mother    Hypertension Father    Irritable bowel syndrome Father    Hyperlipidemia Father    Diabetes Father    Heart disease Father    GER disease Sister    Fragile X syndrome Sister    ADD / ADHD Brother    CAD Maternal Uncle    Thyroid  disease Maternal Grandmother        HYPOTHYROID   Hypertension Maternal Grandmother    Lung cancer Maternal Grandmother    Cancer Maternal Grandmother    Miscarriages / Stillbirths Maternal Grandmother    Stroke Maternal Grandmother    Lung cancer Maternal Grandfather    Cancer Maternal Grandfather    Diabetes Paternal Grandmother    Fibromyalgia Paternal Grandmother    Stroke Paternal Grandmother    Alzheimer's disease Paternal Grandfather    Migraines  Son    Fragile X syndrome Son    Intellectual disability Sister    ADD / ADHD Brother    Colon cancer Neg Hx    Esophageal cancer Neg Hx    Rectal cancer Neg Hx    Stomach cancer Neg Hx     Social History   Socioeconomic History   Marital status: Married    Spouse name: Not on file   Number of children: 2   Years of education: Not on file   Highest education level: Master's degree (e.g., MA, MS, MEng, MEd, MSW, MBA)  Occupational History   Occupation: sonographer  Tobacco Use   Smoking status: Never   Smokeless tobacco: Never  Vaping Use    Vaping status: Never Used  Substance and Sexual Activity   Alcohol use: Yes    Comment: 1 glass per month   Drug use: No   Sexual activity: Yes  Other Topics Concern   Not on file  Social History Narrative   Lives at home with husband & son   Right handed   Caffeine: none      ** Merged History Encounter **       Social Drivers of Health   Tobacco Use: Low Risk (06/15/2024)   Patient History    Smoking Tobacco Use: Never    Smokeless Tobacco Use: Never    Passive Exposure: Not on file  Financial Resource Strain: Not on file  Food Insecurity: No Food Insecurity (10/14/2022)   Hunger Vital Sign    Worried About Running Out of Food in the Last Year: Never true    Ran Out of Food in the Last Year: Never true  Transportation Needs: No Transportation Needs (10/14/2022)   PRAPARE - Administrator, Civil Service (Medical): No    Lack of Transportation (Non-Medical): No  Physical Activity: Insufficiently Active (10/14/2022)   Exercise Vital Sign    Days of Exercise per Week: 2 days    Minutes of Exercise per Session: 20 min  Stress: Not on file  Social Connections: Not on file  Intimate Partner Violence: Not on file  Depression (PHQ2-9): Low Risk (01/27/2024)   Depression (PHQ2-9)    PHQ-2 Score: 0  Alcohol Screen: Low Risk (10/14/2022)   Alcohol Screen    Last Alcohol Screening Score (AUDIT): 3  Housing: Low Risk (10/14/2022)   Housing    Last Housing Risk Score: 0  Utilities: Not on file  Health Literacy: Not on file    Outpatient Medications Prior to Visit  Medication Sig Dispense Refill   albuterol  (PROVENTIL ) (2.5 MG/3ML) 0.083% nebulizer solution Take 3 mLs (2.5 mg total) by nebulization every 6 (six) hours as needed for wheezing or shortness of breath (coughing fits). 75 mL 2   Albuterol -Budesonide  (AIRSUPRA ) 90-80 MCG/ACT AERO Inhale 2 puffs into the lungs every 4 (four) hours as needed (coughing, wheezing, chest tightness). Do not exceed 12 puffs in 24  hours. 10.7 g 2   ALPRAZolam  (XANAX ) 0.25 MG tablet Take 0.25 mg by mouth as needed for anxiety.     Azelastine -Fluticasone  137-50 MCG/ACT SUSP Place 1 spray into the nose in the morning and at bedtime. 23 g 5   budesonide  (PULMICORT ) 0.5 MG/2ML nebulizer solution inhale 2 mLs (0.5 mg total) by nebulization in the morning and at bedtime for 1-2 weeks during upper respiratory infections. 120 mL 0   cetirizine (ZYRTEC) 10 MG tablet Take 10 mg by mouth every morning.     EPINEPHrine  0.3  mg/0.3 mL IJ SOAJ injection Inject 0.3 mg into the muscle as needed for anaphylaxis. 2 each 1   etonogestrel -ethinyl estradiol  (NUVARING) 0.12-0.015 MG/24HR vaginal ring Insert 1 vaginal ring by vaginal route. 3 each 3   famotidine (PEPCID) 20 MG tablet Take 20 mg by mouth as needed for heartburn or indigestion.     Fluticasone -Umeclidin-Vilant (TRELEGY ELLIPTA ) 200-62.5-25 MCG/ACT AEPB Inhale 1 puff into the lungs daily. Rinse mouth after each use. 60 each 5   ipratropium (ATROVENT ) 0.03 % nasal spray Place 1-2 sprays into both nostrils 2 (two) times daily as needed (nasal drainage). 30 mL 5   ipratropium-albuterol  (DUONEB) 0.5-2.5 (3) MG/3ML SOLN Take 3 mLs by nebulization every 4 (four) hours as needed (coughing fits, shortness of breath, wheezing). 180 mL 2   levocetirizine (XYZAL ) 5 MG tablet Take 1 tablet (5 mg total) by mouth every evening. 90 tablet 3   loperamide  (IMODIUM ) 2 MG capsule Take 1 capsule (2 mg total) by mouth every 6 (six) hours. May start with 2 capsules initially 30 capsule 0   montelukast  (SINGULAIR ) 10 MG tablet Take 1 tablet (10 mg total) by mouth at bedtime. 90 tablet 0   Multiple Vitamin (MULTIVITAMIN PO) Take by mouth.     olopatadine  (PATADAY ) 0.1 % ophthalmic solution 1 drop 2 (two) times daily.     sucralfate  (CARAFATE ) 1 g tablet Take 1 tablet (1 g total) by mouth 4 (four) times daily -  with meals and at bedtime for 10 days. 40 tablet 0   triamcinolone  ointment (KENALOG ) 0.1 % Apply 1  application topically 2 (two) times daily. 30 g 2   venlafaxine  XR (EFFEXOR -XR) 37.5 MG 24 hr capsule Take 1 capsule (37.5 mg total) by mouth daily. 90 capsule 3   omeprazole  (PRILOSEC) 20 MG capsule Take 2 capsules (40 mg total) by mouth daily for 56 days, THEN 1 capsule (20 mg total) daily as needed. (Patient not taking: No sig reported) 168 capsule 0   Facility-Administered Medications Prior to Visit  Medication Dose Route Frequency Provider Last Rate Last Admin   0.9 %  sodium chloride  infusion  500 mL Intravenous Once Dorsey, Ying C, MD        Allergies[1]  Review of Systems  Constitutional:  Negative for chills, fever, malaise/fatigue and weight loss.  Respiratory:  Negative for shortness of breath.   Cardiovascular:  Negative for chest pain, palpitations and leg swelling.  Gastrointestinal:  Positive for diarrhea. Negative for abdominal pain, constipation, nausea and vomiting.  Genitourinary:  Negative for dysuria, frequency and urgency.  Neurological:  Negative for dizziness, focal weakness and headaches.       Objective:    Physical Exam Constitutional:      General: She is not in acute distress.    Appearance: She is obese. She is not ill-appearing.  Eyes:     Extraocular Movements: Extraocular movements intact.     Conjunctiva/sclera: Conjunctivae normal.  Cardiovascular:     Rate and Rhythm: Normal rate.  Pulmonary:     Effort: Pulmonary effort is normal.  Musculoskeletal:     Cervical back: Normal range of motion and neck supple.  Skin:    General: Skin is warm and dry.  Neurological:     General: No focal deficit present.     Mental Status: She is alert and oriented to person, place, and time.  Psychiatric:        Mood and Affect: Mood normal.        Behavior: Behavior  normal.        Thought Content: Thought content normal.      BP 108/78   Pulse 85   Temp 97.6 F (36.4 C) (Temporal)   Ht 5' 5 (1.651 m)   Wt 247 lb (112 kg)   SpO2 98%   BMI 41.10  kg/m  Wt Readings from Last 3 Encounters:  06/15/24 247 lb (112 kg)  05/11/24 247 lb 6.4 oz (112.2 kg)  04/17/24 249 lb 8 oz (113.2 kg)       Assessment & Plan:   Problem List Items Addressed This Visit     Moderate persistent asthma without complication   Pure hypercholesterolemia   Severe obesity (BMI >= 40) (HCC) - Primary   Vitamin D  deficiency    Assessment and Plan Assessment & Plan Severe obesity She is interested in starting GLP-1 therapy out of pocket due to insurance limitations. Discussed the benefits of Zepbound. She is aware of the contraindications, including pregnancy, pancreatitis, and thyroid  cancer, none of which apply to her. Informed about potential side effects, including nausea, bloating, and constipation, and the impact on appetite and motility.  Discussed the process of obtaining the medication through [facility] and the need to start at the lowest dose, with plans to increase after 4-6 weeks. - Start Zepbound at the lowest dose of 2.5 mg, to be initiated on January 1st when her [facility] card is available. - Send prescription to [facility] for the vial. - Plan to increase to 5 mg after 4-6 weeks  - She will message via MyChart when ready to start.  IBS-D (Irritable Bowel Syndrome with Diarrhea) Her IBS-D is well-controlled but exacerbated by stress. Aware that GLP-1 therapy may affect motility and potentially exacerbate IBS-D symptoms, but she is willing to try the treatment.  Hyperlipidemia LDL cholesterol is slightly elevated at 115 mg/dL, above the target of less than 100 mg/dL.  Follow-up A follow-up is planned to monitor her progress with the new treatment and overall health status. - Schedule a follow-up appointment in mid-March to assess her response to GLP-1 therapy and overall health.     I am having [name] maintain her Multiple Vitamin (MULTIVITAMIN PO), triamcinolone  ointment, albuterol , olopatadine , loperamide , ipratropium-albuterol ,  EPINEPHrine , ipratropium, budesonide , levocetirizine, Airsupra , ALPRAZolam , famotidine, omeprazole , cetirizine, Azelastine -Fluticasone , sucralfate , etonogestrel -ethinyl estradiol , venlafaxine  XR, Trelegy Ellipta , and montelukast . We will continue to administer sodium chloride .  No orders of the defined types were placed in this encounter.      [1]  Allergies Allergen Reactions   Latex Rash and Dermatitis   Trimethoprim 

## 2024-06-19 ENCOUNTER — Ambulatory Visit

## 2024-06-19 DIAGNOSIS — J309 Allergic rhinitis, unspecified: Secondary | ICD-10-CM | POA: Diagnosis not present

## 2024-06-29 ENCOUNTER — Encounter: Payer: Self-pay | Admitting: Family Medicine

## 2024-06-30 ENCOUNTER — Other Ambulatory Visit: Payer: Self-pay | Admitting: Family Medicine

## 2024-06-30 MED ORDER — TIRZEPATIDE-WEIGHT MANAGEMENT 2.5 MG/0.5ML ~~LOC~~ SOLN: 2.5000 mg | SUBCUTANEOUS | 1 refills | Status: AC

## 2024-07-04 DIAGNOSIS — J309 Allergic rhinitis, unspecified: Secondary | ICD-10-CM | POA: Diagnosis not present

## 2024-07-10 ENCOUNTER — Encounter (HOSPITAL_COMMUNITY): Payer: Self-pay

## 2024-07-10 ENCOUNTER — Encounter: Payer: Self-pay | Admitting: Family Medicine

## 2024-07-10 ENCOUNTER — Other Ambulatory Visit (HOSPITAL_COMMUNITY): Payer: Self-pay

## 2024-07-10 MED ORDER — SERTRALINE HCL 50 MG PO TABS
50.0000 mg | ORAL_TABLET | Freq: Every day | ORAL | 2 refills | Status: AC
  Filled 2024-07-13: qty 90, 90d supply, fill #0

## 2024-07-11 ENCOUNTER — Ambulatory Visit

## 2024-07-11 DIAGNOSIS — J302 Other seasonal allergic rhinitis: Secondary | ICD-10-CM

## 2024-07-11 DIAGNOSIS — H1010 Acute atopic conjunctivitis, unspecified eye: Secondary | ICD-10-CM

## 2024-07-13 ENCOUNTER — Encounter: Payer: Self-pay | Admitting: Pharmacist

## 2024-07-13 ENCOUNTER — Other Ambulatory Visit: Payer: Self-pay

## 2024-07-13 ENCOUNTER — Other Ambulatory Visit (HOSPITAL_COMMUNITY): Payer: Self-pay

## 2024-07-18 ENCOUNTER — Ambulatory Visit

## 2024-07-18 DIAGNOSIS — J302 Other seasonal allergic rhinitis: Secondary | ICD-10-CM

## 2024-07-25 ENCOUNTER — Ambulatory Visit

## 2024-07-25 DIAGNOSIS — J302 Other seasonal allergic rhinitis: Secondary | ICD-10-CM

## 2024-08-01 ENCOUNTER — Ambulatory Visit

## 2024-08-01 DIAGNOSIS — J302 Other seasonal allergic rhinitis: Secondary | ICD-10-CM

## 2024-08-07 ENCOUNTER — Ambulatory Visit: Admitting: Allergy
